# Patient Record
Sex: Female | Born: 1965
Health system: Southern US, Community
[De-identification: ages and names within clinical notes are randomized; demographics above are authoritative.]

## PROBLEM LIST (undated history)

## (undated) DIAGNOSIS — F319 Bipolar disorder, unspecified: Secondary | ICD-10-CM

## (undated) DIAGNOSIS — I10 Essential (primary) hypertension: Secondary | ICD-10-CM

## (undated) DIAGNOSIS — F329 Major depressive disorder, single episode, unspecified: Secondary | ICD-10-CM

## (undated) DIAGNOSIS — F32A Depression, unspecified: Secondary | ICD-10-CM

## (undated) HISTORY — PX: JOINT REPLACEMENT: SHX530

## (undated) HISTORY — PX: ABDOMINAL HYSTERECTOMY: SHX81

## (undated) HISTORY — DX: Bipolar disorder, unspecified: F31.9

## (undated) HISTORY — PX: TOTAL ABDOMINAL HYSTERECTOMY: SHX209

## (undated) HISTORY — PX: APPENDECTOMY: SHX54

## (undated) HISTORY — DX: Essential (primary) hypertension: I10

---

## 2005-01-16 ENCOUNTER — Ambulatory Visit: Payer: Self-pay

## 2005-01-25 ENCOUNTER — Ambulatory Visit: Payer: Self-pay

## 2005-03-19 ENCOUNTER — Inpatient Hospital Stay: Payer: Self-pay | Admitting: Psychiatry

## 2006-08-07 ENCOUNTER — Ambulatory Visit: Payer: Self-pay

## 2007-07-08 ENCOUNTER — Other Ambulatory Visit: Payer: Self-pay

## 2007-07-08 ENCOUNTER — Emergency Department: Payer: Self-pay | Admitting: Emergency Medicine

## 2007-07-10 ENCOUNTER — Ambulatory Visit: Payer: Self-pay | Admitting: Family Medicine

## 2007-08-29 ENCOUNTER — Ambulatory Visit: Payer: Self-pay | Admitting: Family Medicine

## 2007-09-30 ENCOUNTER — Ambulatory Visit: Payer: Self-pay | Admitting: Neurology

## 2007-10-08 ENCOUNTER — Ambulatory Visit: Payer: Self-pay

## 2008-05-17 ENCOUNTER — Ambulatory Visit: Payer: Self-pay | Admitting: Neurology

## 2008-09-17 ENCOUNTER — Ambulatory Visit: Payer: Self-pay | Admitting: Family Medicine

## 2009-11-03 ENCOUNTER — Ambulatory Visit: Payer: Self-pay | Admitting: Nephrology

## 2011-03-22 ENCOUNTER — Ambulatory Visit: Payer: Self-pay | Admitting: Nephrology

## 2011-06-06 ENCOUNTER — Emergency Department: Payer: Self-pay | Admitting: Unknown Physician Specialty

## 2011-06-06 LAB — COMPREHENSIVE METABOLIC PANEL
Bilirubin,Total: 0.5 mg/dL (ref 0.2–1.0)
Calcium, Total: 9.1 mg/dL (ref 8.5–10.1)
Chloride: 106 mmol/L (ref 98–107)
Co2: 26 mmol/L (ref 21–32)
Creatinine: 0.63 mg/dL (ref 0.60–1.30)
EGFR (African American): >60
EGFR (Non-African Amer.): >60
Osmolality: 279 (ref 275–301)
Sodium: 140 mmol/L (ref 136–145)

## 2011-06-06 LAB — URINALYSIS, COMPLETE
Bilirubin,UR: NEGATIVE
Blood: NEGATIVE
Ketone: NEGATIVE
Ph: 8 (ref 4.5–8.0)
Squamous Epithelial: 2

## 2011-06-06 LAB — CBC
HGB: 12 g/dL (ref 12.0–16.0)
Platelet: 221 10*3/uL (ref 150–440)
RBC: 4.17 10*6/uL (ref 3.80–5.20)

## 2011-06-26 ENCOUNTER — Ambulatory Visit: Payer: Self-pay | Admitting: Obstetrics and Gynecology

## 2011-07-10 ENCOUNTER — Ambulatory Visit: Payer: Self-pay | Admitting: Gynecologic Oncology

## 2011-07-25 ENCOUNTER — Ambulatory Visit: Payer: Self-pay | Admitting: Gynecologic Oncology

## 2011-08-08 ENCOUNTER — Ambulatory Visit: Payer: Self-pay | Admitting: Obstetrics and Gynecology

## 2011-08-08 LAB — CBC
HCT: 35.4 % (ref 35.0–47.0)
HGB: 11.8 g/dL — ABNORMAL LOW (ref 12.0–16.0)
MCH: 29.1 pg (ref 26.0–34.0)
RDW: 12.9 % (ref 11.5–14.5)
WBC: 4.5 10*3/uL (ref 3.6–11.0)

## 2011-08-14 ENCOUNTER — Ambulatory Visit: Payer: Self-pay | Admitting: Obstetrics and Gynecology

## 2011-08-16 LAB — PATHOLOGY REPORT

## 2012-05-14 ENCOUNTER — Ambulatory Visit: Payer: Self-pay

## 2012-05-19 LAB — HM MAMMOGRAPHY

## 2014-07-18 NOTE — Op Note (Signed)
PATIENT NAME:  Gabriella Hensley, Gabriella Hensley MR#:  [id number] DATE OF BIRTH:  03-22-1966  DATE OF PROCEDURE:  08/14/2011  PREOPERATIVE DIAGNOSIS: Left ovarian cyst.   POSTOPERATIVE DIAGNOSES:  1. Left paratubal cyst.  2. Significant peritoneal adhesive disease.   PROCEDURES PERFORMED: Diagnostic laparoscopy with lysis of adhesions as well as bilateral salpingectomy.   SURGEON: Stoney Bang. Georgianne Fick, MD  ASSISTANT: Dr. Foster Simpson   ANESTHESIA: General.   ESTIMATED BLOOD LOSS: 75 mL.  OPERATIVE FLUIDS: 800 mL.   URINE OUTPUT: 70 mL.   COMPLICATIONS: None.   FINDINGS: Upon entry into the abdomen and establishing insufflation it was noted there is significant adhesive disease of the rectus sigmoid colon to the vaginal cuff as well as to the left sidewall. These adhesions were taken down for the most part although there was still a thick band of adhesion from the left sidewall to the vaginal cuff which was not taken down secondary to concern for possibly containing a portion of the ureter. The right ovary and tube appeared grossly normal in appearance. The left tube was noted to have a large 2 to 3 cm paratubal cyst. No left ovarian tissue was able to be identified. The remainder of the pelvic anatomy as well as intra-abdominal anatomy was grossly normal in appearance.   SPECIMENS REMOVED: Portion of right and left tube.   PATIENT CONDITION FOLLOWING PROCEDURE: Stable.   PROCEDURE IN DETAIL: Risks, benefits, and alternatives of procedure were discussed with the patient prior to proceeding to the Operating Room. Of note, the patient does have a significant family history of ovarian cancer in her mother. She did undergo BRCA testing prior to proceeding to the Operating Room as well as consultation with Dr. Jacquelyne Balint of gynecology oncology. At the beginning of the case a Foley catheter was placed. A sponge stick was placed in the patient's vagina to allow delineation of the vaginal cuff. Next,  attention was turned to the patient's abdomen. The umbilicus was infiltrated with approximately 5 mL of 1% lidocaine. A stab incision was then made in the base of the umbilicus and a 5 mm Xcel trocar was placed under direct visualization into the peritoneal cavity. Insufflation was begun. A 10 mm right assistant port and a 5 mm left assistant port were then placed under direct visualization. Inspection of the abdomen noted the above noted findings. Initial concern had been for a left complex ovarian cyst, however, it appears that the radiological findings were secondary to left peritubal cyst and significant adhesive disease on the left with no identifiable left ovary noted. Attention was first turned to the patient's right adnexal structures. The ovary appeared normal. The tube was grasped and then dissected off the ovary and the pelvic sidewall using a 5 mm LigaSure device. The right tube was then removed through the 10 mm trocar. Attention was then turned to the patient's left where significant adhesions were noted. The adhesions were taken down with the 5 mm LigaSure device as well as blunt dissection. The vaginal cuff had a significant amount of rectosigmoid adhesed to it and this was also taken down for the most part using a 5 mm LigaSure device. There was a thick band of adhesion from the left sidewall to the vaginal cuff which was not taken down secondary to concern of potentially containing the ureter. At this point, enough visualization had been established to see the left tube which contained a large peritubal cyst. The left tube was excised using the 5 mm LigaSure  device. The ureter was seen coursing medially. The left retroperitoneum was opened up in order to allow visualization of the IP which was followed, however, no ovary was able to be identified. At this point it was decided that radiological findings likely corresponded to the paratubal cyst as well as adhesive disease rather than a left ovarian  cyst. It was decided to stop the procedure at this point as the pathology had been identified and removed. Following removal of both tubes the pelvis was irrigated and hemostasis was achieved using bipolar cautery. The trocars were then removed after pneumoperitoneum was evacuated. The 10 mm trocar site on the right was closed using a 2-0 Vicryl and a GU needle. Following closure of the right trocar site the remaining trocar sites as well as the right trocar site were dressed with Dermabond. After achieving hemostasis using bipolar cautery 1 gram of Aristo was placed on the bed of the left fallopian tube dissection to further guarantee hemostasis. Sponge, needle, and instrument counts were correct x2.     The patient tolerated the procedure well and was taken to recovery room in stable condition.  ____________________________ Stoney Bang. Georgianne Fick, MD ams:cms D: 08/14/2011 12:56:02 ET T: 08/14/2011 13:59:59 ET JOB#: [id number]  cc: Stoney Bang. Georgianne Fick, MD, <Dictator> Conan Bowens Madelon Lips MD ELECTRONICALLY SIGNED 08/21/2011 7:23

## 2014-09-15 ENCOUNTER — Encounter: Payer: Self-pay | Admitting: Emergency Medicine

## 2014-09-15 ENCOUNTER — Emergency Department
Admission: EM | Admit: 2014-09-15 | Discharge: 2014-09-15 | Disposition: A | Discharge status: Home / Self Care | Payer: BLUE CROSS/BLUE SHIELD | Attending: Student | Admitting: Student

## 2014-09-15 ENCOUNTER — Emergency Department: Payer: BLUE CROSS/BLUE SHIELD

## 2014-09-15 DIAGNOSIS — Y9389 Activity, other specified: Secondary | ICD-10-CM | POA: Diagnosis not present

## 2014-09-15 DIAGNOSIS — Y92410 Unspecified street and highway as the place of occurrence of the external cause: Secondary | ICD-10-CM | POA: Insufficient documentation

## 2014-09-15 DIAGNOSIS — S199XXA Unspecified injury of neck, initial encounter: Secondary | ICD-10-CM | POA: Diagnosis not present

## 2014-09-15 DIAGNOSIS — Z79899 Other long term (current) drug therapy: Secondary | ICD-10-CM | POA: Insufficient documentation

## 2014-09-15 DIAGNOSIS — Y998 Other external cause status: Secondary | ICD-10-CM | POA: Diagnosis not present

## 2014-09-15 DIAGNOSIS — S0990XA Unspecified injury of head, initial encounter: Secondary | ICD-10-CM | POA: Diagnosis not present

## 2014-09-15 HISTORY — DX: Major depressive disorder, single episode, unspecified: F32.9

## 2014-09-15 HISTORY — DX: Depression, unspecified: F32.A

## 2014-09-15 MED ORDER — CYCLOBENZAPRINE HCL 10 MG PO TABS: 10.0000 mg | ORAL_TABLET | Freq: Three times a day (TID) | ORAL | Status: DC | PRN

## 2014-09-15 MED ORDER — IBUPROFEN 800 MG PO TABS: 800.0000 mg | ORAL_TABLET | Freq: Three times a day (TID) | ORAL | Status: DC | PRN
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
## 2014-09-15 NOTE — Discharge Instructions (Signed)

## 2014-11-09 ENCOUNTER — Ambulatory Visit (INDEPENDENT_AMBULATORY_CARE_PROVIDER_SITE_OTHER): Payer: BLUE CROSS/BLUE SHIELD | Admitting: Unknown Physician Specialty

## 2014-11-09 ENCOUNTER — Encounter: Payer: Self-pay | Admitting: Unknown Physician Specialty

## 2014-11-09 VITALS — BP 132/81 | HR 56 | Temp 98.3°F | Ht 67.3 in | Wt 236.0 lb

## 2014-11-09 DIAGNOSIS — F419 Anxiety disorder, unspecified: Secondary | ICD-10-CM | POA: Insufficient documentation

## 2014-11-09 DIAGNOSIS — E785 Hyperlipidemia, unspecified: Secondary | ICD-10-CM | POA: Insufficient documentation

## 2014-11-09 DIAGNOSIS — L298 Other pruritus: Secondary | ICD-10-CM | POA: Diagnosis not present

## 2014-11-09 DIAGNOSIS — E669 Obesity, unspecified: Secondary | ICD-10-CM | POA: Insufficient documentation

## 2014-11-09 DIAGNOSIS — F319 Bipolar disorder, unspecified: Secondary | ICD-10-CM

## 2014-11-09 DIAGNOSIS — F329 Major depressive disorder, single episode, unspecified: Secondary | ICD-10-CM

## 2014-11-09 DIAGNOSIS — F32A Depression, unspecified: Secondary | ICD-10-CM | POA: Insufficient documentation

## 2014-11-09 DIAGNOSIS — F3181 Bipolar II disorder: Secondary | ICD-10-CM | POA: Insufficient documentation

## 2014-11-09 DIAGNOSIS — N898 Other specified noninflammatory disorders of vagina: Secondary | ICD-10-CM

## 2014-11-09 LAB — UA/M W/RFLX CULTURE, ROUTINE
Bilirubin, UA: NEGATIVE
Glucose, UA: NEGATIVE
Ketones, UA: NEGATIVE
LEUKOCYTES UA: NEGATIVE
Nitrite, UA: NEGATIVE
PH UA: 6 (ref 5.0–7.5)
Protein, UA: NEGATIVE
Specific Gravity, UA: 1.02 (ref 1.005–1.030)
Urobilinogen, Ur: 0.2 mg/dL (ref 0.2–1.0)

## 2014-11-09 LAB — MICROSCOPIC EXAMINATION

## 2014-11-09 LAB — WET PREP FOR TRICH, YEAST, CLUE
CLUE CELL EXAM: NEGATIVE
Trichomonas Exam: NEGATIVE
Yeast Exam: NEGATIVE

## 2014-11-09 MED ORDER — FLUCONAZOLE 150 MG PO TABS: 150.0000 mg | ORAL_TABLET | Freq: Once | ORAL | Status: DC

## 2014-11-09 NOTE — Progress Notes (Signed)
   BP 132/81 mmHg  Pulse 56  Temp(Src) 98.3 F (36.8 C)  Ht 5' 7.3" (1.709 m)  Wt 236 lb (107.049 kg)  BMI 36.65 kg/m2  SpO2 98%  LMP  (LMP Unknown)   Subjective:    Patient ID: Gabriella Hensley, female    DOB: 01-15-1966, 49 y.o.   MRN: [id number]  HPI: Gabriella Hensley is a 49 y.o. female  Chief Complaint  Patient presents with  . Vaginitis    pt states she has vaginal itching and burning. States she tried Azo.   Vaginitis Pt with itching around urethra.  Tried Azo and made her back hurt.  No vaginal discharge.  Not concerned about STDs.  No addominal or pelvic pain.  No pain with intercourse.  States she feels it is irritated back to rectum.    Relevant past medical, surgical, family and social history reviewed and updated as indicated. Interim medical history since our last visit reviewed. Allergies and medications reviewed and updated.  Review of Systems  Per HPI unless specifically indicated above     Objective:    BP 132/81 mmHg  Pulse 56  Temp(Src) 98.3 F (36.8 C)  Ht 5' 7.3" (1.709 m)  Wt 236 lb (107.049 kg)  BMI 36.65 kg/m2  SpO2 98%  LMP  (LMP Unknown)  Wt Readings from Last 3 Encounters:  11/09/14 236 lb (107.049 kg)  05/28/14 233 lb (105.688 kg)  09/15/14 234 lb (106.142 kg)    Physical Exam  Constitutional: She is oriented to person, place, and time. She appears well-developed and well-nourished. No distress.  HENT:  Head: Normocephalic and atraumatic.  Eyes: Conjunctivae and lids are normal. Right eye exhibits no discharge. Left eye exhibits no discharge. No scleral icterus.  Cardiovascular: Normal rate and regular rhythm.   Pulmonary/Chest: Effort normal. No respiratory distress.  Abdominal: Soft. Normal appearance. She exhibits no distension. There is no splenomegaly or hepatomegaly. There is no tenderness.  Genitourinary: Pelvic exam was performed with patient prone. Cervix exhibits no motion tenderness, no discharge and no friability. There is  erythema in the vagina.  Musculoskeletal: Normal range of motion.  Neurological: She is alert and oriented to person, place, and time.  Skin: Skin is intact. No rash noted. No pallor.  Psychiatric: She has a normal mood and affect. Her behavior is normal. Judgment and thought content normal.  Nursing note and vitals reviewed.  Wet prep is negative     Assessment & Plan:   Problem List Items Addressed This Visit    None    Visit Diagnoses    Vaginal itching    -  Primary    Uncertain diagnosis.  since complaining of irritation, I will rx Diflucan 150 mg.      Relevant Orders    UA/M w/rflx Culture, Routine    WET PREP FOR TRICH, YEAST, CLUE        Follow up plan: Return for physical.

## 2014-12-01 ENCOUNTER — Ambulatory Visit (INDEPENDENT_AMBULATORY_CARE_PROVIDER_SITE_OTHER): Payer: BLUE CROSS/BLUE SHIELD | Admitting: Unknown Physician Specialty

## 2014-12-01 ENCOUNTER — Encounter: Payer: Self-pay | Admitting: Unknown Physician Specialty

## 2014-12-01 VITALS — BP 127/85 | HR 63 | Temp 98.3°F | Ht 67.3 in | Wt 237.0 lb

## 2014-12-01 DIAGNOSIS — Z0000 Encounter for general adult medical examination without abnormal findings: Secondary | ICD-10-CM

## 2014-12-01 NOTE — Progress Notes (Signed)
   BP 127/85 mmHg  Pulse 63  Temp(Src) 98.3 F (36.8 C)  Ht 5' 7.3" (1.709 m)  Wt 237 lb (107.502 kg)  BMI 36.81 kg/m2  SpO2 99%  LMP  (LMP Unknown)   Subjective:    Patient ID: Gabriella Hensley, female    DOB: [birth year], 49 y.o.   MRN: [id number]  HPI: Gabriella Hensley is a 49 y.o. female  Chief Complaint  Patient presents with  . Annual Exam    Relevant past medical, surgical, family and social history reviewed and updated as indicated. Interim medical history since our last visit reviewed. Allergies and medications reviewed and updated.  Review of Systems  Constitutional: Negative.   HENT: Negative.   Eyes: Negative.   Respiratory: Negative.   Cardiovascular: Negative.   Gastrointestinal: Negative.   Endocrine: Negative.   Genitourinary: Negative.   Musculoskeletal: Negative.   Skin: Negative.   Allergic/Immunologic: Negative.   Neurological: Negative.   Hematological: Negative.   Psychiatric/Behavioral: Negative.     Per HPI unless specifically indicated above     Objective:    BP 127/85 mmHg  Pulse 63  Temp(Src) 98.3 F (36.8 C)  Ht 5' 7.3" (1.709 m)  Wt 237 lb (107.502 kg)  BMI 36.81 kg/m2  SpO2 99%  LMP  (LMP Unknown)  Wt Readings from Last 3 Encounters:  12/01/14 237 lb (107.502 kg)  11/09/14 236 lb (107.049 kg)  05/28/14 233 lb (105.688 kg)    Physical Exam  Constitutional: She is oriented to person, place, and time. She appears well-developed and well-nourished.  HENT:  Head: Normocephalic and atraumatic.  Eyes: Pupils are equal, round, and reactive to light. Right eye exhibits no discharge. Left eye exhibits no discharge. No scleral icterus.  Neck: Normal range of motion. Neck supple. Carotid bruit is not present. No thyromegaly present.  Cardiovascular: Normal rate, regular rhythm and normal heart sounds.  Exam reveals no gallop and no friction rub.   No murmur heard. Pulmonary/Chest: Effort normal and breath sounds normal. No respiratory  distress. She has no wheezes. She has no rales.  Abdominal: Soft. Bowel sounds are normal. There is no tenderness. There is no rebound.  Genitourinary: No breast swelling, tenderness or discharge.  Musculoskeletal: Normal range of motion.  Lymphadenopathy:    She has no cervical adenopathy.  Neurological: She is alert and oriented to person, place, and time.  Skin: Skin is warm, dry and intact. No rash noted.  Psychiatric: She has a normal mood and affect. Her speech is normal and behavior is normal. Judgment and thought content normal. Cognition and memory are normal.       Assessment & Plan:   Problem List Items Addressed This Visit    None    Visit Diagnoses    Annual physical exam    -  Primary    Relevant Orders    HIV antibody    CBC with Differential/Platelet    Comprehensive metabolic panel    TSH    Lipid Panel w/o Chol/HDL Ratio    MM DIGITAL SCREENING BILATERAL        Follow up plan: Return in about 6 months (around 05/31/2015).

## 2014-12-02 ENCOUNTER — Encounter: Payer: Self-pay | Admitting: Unknown Physician Specialty

## 2014-12-02 LAB — COMPREHENSIVE METABOLIC PANEL
A/G RATIO: 1.6 (ref 1.1–2.5)
ALBUMIN: 4.3 g/dL (ref 3.5–5.5)
ALT: 16 IU/L (ref 0–32)
AST: 18 IU/L (ref 0–40)
Alkaline Phosphatase: 102 IU/L (ref 39–117)
BILIRUBIN TOTAL: 0.4 mg/dL (ref 0.0–1.2)
BUN / CREAT RATIO: 15 (ref 9–23)
BUN: 11 mg/dL (ref 6–24)
CO2: 24 mmol/L (ref 18–29)
Calcium: 9.8 mg/dL (ref 8.7–10.2)
Chloride: 101 mmol/L (ref 97–108)
Creatinine, Ser: 0.71 mg/dL (ref 0.57–1.00)
GFR calc non Af Amer: 101 mL/min/{1.73_m2} (ref >59)
GFR, EST AFRICAN AMERICAN: 116 mL/min/{1.73_m2} (ref >59)
Globulin, Total: 2.7 g/dL (ref 1.5–4.5)
Glucose: 88 mg/dL (ref 65–99)
Potassium: 4.2 mmol/L (ref 3.5–5.2)
Sodium: 139 mmol/L (ref 134–144)
TOTAL PROTEIN: 7 g/dL (ref 6.0–8.5)

## 2014-12-02 LAB — CBC WITH DIFFERENTIAL/PLATELET
BASOS: 0 %
Basophils Absolute: 0 10*3/uL (ref 0.0–0.2)
EOS (ABSOLUTE): 0.1 10*3/uL (ref 0.0–0.4)
Eos: 2 %
HEMATOCRIT: 35 % (ref 34.0–46.6)
Hemoglobin: 11.9 g/dL (ref 11.1–15.9)
IMMATURE GRANS (ABS): 0 10*3/uL (ref 0.0–0.1)
Immature Granulocytes: 0 %
LYMPHS: 44 %
Lymphocytes Absolute: 1.9 10*3/uL (ref 0.7–3.1)
MCH: 28.6 pg (ref 26.6–33.0)
MCHC: 34 g/dL (ref 31.5–35.7)
MCV: 84 fL (ref 79–97)
Monocytes Absolute: 0.3 10*3/uL (ref 0.1–0.9)
Monocytes: 8 %
Neutrophils Absolute: 1.9 10*3/uL (ref 1.4–7.0)
Neutrophils: 46 %
Platelets: 244 10*3/uL (ref 150–379)
RBC: 4.16 x10E6/uL (ref 3.77–5.28)
RDW: 13.5 % (ref 12.3–15.4)
WBC: 4.3 10*3/uL (ref 3.4–10.8)

## 2014-12-02 LAB — LIPID PANEL W/O CHOL/HDL RATIO
Cholesterol, Total: 194 mg/dL (ref 100–199)
HDL: 49 mg/dL (ref >39)
LDL Calculated: 131 mg/dL — ABNORMAL HIGH (ref 0–99)
Triglycerides: 70 mg/dL (ref 0–149)
VLDL Cholesterol Cal: 14 mg/dL (ref 5–40)

## 2014-12-02 LAB — TSH: TSH: 1.65 u[IU]/mL (ref 0.450–4.500)

## 2014-12-02 LAB — HIV ANTIBODY (ROUTINE TESTING W REFLEX): HIV Screen 4th Generation wRfx: NONREACTIVE

## 2015-02-25 ENCOUNTER — Other Ambulatory Visit: Payer: Self-pay | Admitting: Unknown Physician Specialty

## 2015-03-25 ENCOUNTER — Other Ambulatory Visit: Payer: Self-pay | Admitting: Unknown Physician Specialty

## 2015-03-25 MED ORDER — RISPERIDONE 3 MG PO TABS: 3.0000 mg | ORAL_TABLET | Freq: Every day | ORAL | Status: DC

## 2015-03-25 NOTE — Telephone Encounter (Signed)
Patient was seen 12/01/14 and has appointment 05/31/15.

## 2015-03-25 NOTE — Telephone Encounter (Signed)
Pt called needs refill on Risperidone. Pharm is Paediatric nurse on [address] Pt stated she needs this medication by Monday if possible. Thanks.

## 2015-05-31 ENCOUNTER — Ambulatory Visit (INDEPENDENT_AMBULATORY_CARE_PROVIDER_SITE_OTHER): Payer: BLUE CROSS/BLUE SHIELD | Admitting: Unknown Physician Specialty

## 2015-05-31 ENCOUNTER — Encounter: Payer: Self-pay | Admitting: Unknown Physician Specialty

## 2015-05-31 VITALS — BP 129/84 | HR 60 | Temp 98.0°F | Ht 66.2 in | Wt 232.6 lb

## 2015-05-31 DIAGNOSIS — F3181 Bipolar II disorder: Secondary | ICD-10-CM

## 2015-05-31 NOTE — Assessment & Plan Note (Signed)
Stable, continue present medications.   

## 2015-05-31 NOTE — Progress Notes (Signed)
   BP 129/84 mmHg  Pulse 60  Temp(Src) 98 F (36.7 C)  Ht 5' 6.2" (1.681 m)  Wt 232 lb 9.6 oz (105.507 kg)  BMI 37.34 kg/m2  SpO2 99%  LMP  (LMP Unknown)   Subjective:    Patient ID: Gabriella Hensley, female    DOB: Jun 24, 1965, 50 y.o.   MRN: [id number]  HPI: Gabriella Hensley is a 50 y.o. female  Chief Complaint  Patient presents with  . Depression   Depression/bipolar Pt has been on the same medications since 2004 other than 2007 switching anti-depressant to Buproprion when she went to the hospital for a relapse.  She maintains a full time job at [employer].  She would like refills today.  No metabolic side-effects as she lost 5 pounds from last visit.    Depression screen [facility] 2/9 05/31/2015 12/01/2014  Decreased Interest 0 0  Down, Depressed, Hopeless 0 0  PHQ - 2 Score 0 0     Relevant past medical, surgical, family and social history reviewed and updated as indicated. Interim medical history since our last visit reviewed. Allergies and medications reviewed and updated.  Review of Systems  Per HPI unless specifically indicated above     Objective:    BP 129/84 mmHg  Pulse 60  Temp(Src) 98 F (36.7 C)  Ht 5' 6.2" (1.681 m)  Wt 232 lb 9.6 oz (105.507 kg)  BMI 37.34 kg/m2  SpO2 99%  LMP  (LMP Unknown)  Wt Readings from Last 3 Encounters:  05/31/15 232 lb 9.6 oz (105.507 kg)  12/01/14 237 lb (107.502 kg)  11/09/14 236 lb (107.049 kg)    Physical Exam  Constitutional: She is oriented to person, place, and time. She appears well-developed and well-nourished. No distress.  HENT:  Head: Normocephalic and atraumatic.  Eyes: Conjunctivae and lids are normal. Right eye exhibits no discharge. Left eye exhibits no discharge. No scleral icterus.  Neck: Normal range of motion. Neck supple. No JVD present. Carotid bruit is not present.  Cardiovascular: Normal rate, regular rhythm and normal heart sounds.   Pulmonary/Chest: Effort normal and breath sounds normal.  Abdominal: Normal  appearance. There is no splenomegaly or hepatomegaly.  Musculoskeletal: Normal range of motion.  Neurological: She is alert and oriented to person, place, and time.  Skin: Skin is warm, dry and intact. No rash noted. No pallor.  Psychiatric: She has a normal mood and affect. Her behavior is normal. Judgment and thought content normal.     Assessment & Plan:   Problem List Items Addressed This Visit      Unprioritized   Bipolar 2 disorder ([place]) - Primary    Stable, continue present medications.            Follow up plan: Return in about 6 months (around 12/01/2015) for physical.

## 2015-07-26 ENCOUNTER — Other Ambulatory Visit: Payer: Self-pay | Admitting: Unknown Physician Specialty

## 2015-07-26 ENCOUNTER — Ambulatory Visit
Admission: RE | Admit: 2015-07-26 | Discharge: 2015-07-26 | Disposition: A | Discharge status: Home / Self Care | Payer: BLUE CROSS/BLUE SHIELD | Source: Ambulatory Visit | Attending: Unknown Physician Specialty | Admitting: Unknown Physician Specialty

## 2015-07-26 DIAGNOSIS — Z1231 Encounter for screening mammogram for malignant neoplasm of breast: Secondary | ICD-10-CM | POA: Diagnosis not present

## 2015-07-26 DIAGNOSIS — Z0000 Encounter for general adult medical examination without abnormal findings: Secondary | ICD-10-CM

## 2015-09-30 DIAGNOSIS — J301 Allergic rhinitis due to pollen: Secondary | ICD-10-CM | POA: Diagnosis not present

## 2015-10-28 ENCOUNTER — Other Ambulatory Visit: Payer: Self-pay | Admitting: Unknown Physician Specialty

## 2015-11-17 ENCOUNTER — Encounter (INDEPENDENT_AMBULATORY_CARE_PROVIDER_SITE_OTHER): Payer: Self-pay

## 2015-11-26 ENCOUNTER — Other Ambulatory Visit: Payer: Self-pay | Admitting: Family Medicine

## 2015-12-09 ENCOUNTER — Ambulatory Visit (INDEPENDENT_AMBULATORY_CARE_PROVIDER_SITE_OTHER): Payer: BLUE CROSS/BLUE SHIELD | Admitting: Unknown Physician Specialty

## 2015-12-09 ENCOUNTER — Other Ambulatory Visit: Payer: Self-pay

## 2015-12-09 ENCOUNTER — Encounter: Payer: Self-pay | Admitting: Unknown Physician Specialty

## 2015-12-09 VITALS — BP 116/80 | HR 63 | Temp 98.2°F | Ht 66.6 in | Wt 231.4 lb

## 2015-12-09 DIAGNOSIS — E669 Obesity, unspecified: Secondary | ICD-10-CM | POA: Diagnosis not present

## 2015-12-09 DIAGNOSIS — F32A Depression, unspecified: Secondary | ICD-10-CM

## 2015-12-09 DIAGNOSIS — F3181 Bipolar II disorder: Secondary | ICD-10-CM

## 2015-12-09 DIAGNOSIS — Z0000 Encounter for general adult medical examination without abnormal findings: Secondary | ICD-10-CM | POA: Diagnosis not present

## 2015-12-09 DIAGNOSIS — F329 Major depressive disorder, single episode, unspecified: Secondary | ICD-10-CM

## 2015-12-09 DIAGNOSIS — M7071 Other bursitis of hip, right hip: Secondary | ICD-10-CM

## 2015-12-09 DIAGNOSIS — Z5181 Encounter for therapeutic drug level monitoring: Secondary | ICD-10-CM

## 2015-12-09 LAB — BAYER DCA HB A1C WAIVED: HB A1C (BAYER DCA - WAIVED): 5.9 % (ref <7.0)

## 2015-12-09 LAB — HEMOGLOBIN A1C

## 2015-12-09 MED ORDER — BUPROPION HCL ER (SMOKING DET) 150 MG PO TB12: 150.0000 mg | ORAL_TABLET | Freq: Every day | ORAL | 1 refills | Status: DC

## 2015-12-09 MED ORDER — RISPERIDONE 3 MG PO TABS: 3.0000 mg | ORAL_TABLET | Freq: Every day | ORAL | 3 refills | Status: DC

## 2015-12-09 NOTE — Assessment & Plan Note (Signed)
Stable, continue present medications.   

## 2015-12-09 NOTE — Assessment & Plan Note (Signed)
Discussed diet and exercise 

## 2015-12-09 NOTE — Progress Notes (Signed)
BP 116/80 (BP Location: Left Arm, Patient Position: Sitting, Cuff Size: Large)   Pulse 63   Temp 98.2 F (36.8 C)   Ht 5' 6.6" (1.692 m)   Wt 231 lb 6.4 oz (105 kg)   LMP  (LMP Unknown)   SpO2 98%   BMI 36.68 kg/m    Subjective:    Patient ID: Gabriella Hensley, female    DOB: [birth year], 50 y.o.   MRN: [id number]  HPI: Gabriella Hensley is a 50 y.o. female  Chief Complaint  Patient presents with  . Annual Exam   Presents today for annual physical, also wants to discuss lower back pain and numbness in her right leg.  Depression Stable.   Depression screen [facility] 2/9 05/31/2015 12/01/2014  Decreased Interest 0 0  Down, Depressed, Hopeless 0 0  PHQ - 2 Score 0 0        Relevant past medical, surgical, family and social history reviewed and updated as indicated. Interim medical history since our last visit reviewed. Allergies and medications reviewed and updated.  Review of Systems  Constitutional: Negative.   HENT: Negative.   Eyes: Negative.   Respiratory: Negative.   Cardiovascular: Negative.   Gastrointestinal: Negative.   Endocrine: Negative.   Genitourinary: Negative.   Musculoskeletal: Negative.  Negative for back pain.  Skin: Negative.   Allergic/Immunologic: Negative.   Neurological: Positive for numbness.  Hematological: Negative.   Psychiatric/Behavioral: Negative.     Per HPI unless specifically indicated above     Objective:    BP 116/80 (BP Location: Left Arm, Patient Position: Sitting, Cuff Size: Large)   Pulse 63   Temp 98.2 F (36.8 C)   Ht 5' 6.6" (1.692 m)   Wt 231 lb 6.4 oz (105 kg)   LMP  (LMP Unknown)   SpO2 98%   BMI 36.68 kg/m   Wt Readings from Last 3 Encounters:  12/09/15 231 lb 6.4 oz (105 kg)  05/31/15 232 lb 9.6 oz (105.5 kg)  12/01/14 237 lb (107.5 kg)    Physical Exam  Constitutional: She is oriented to person, place, and time. Vital signs are normal. She appears well-developed and well-nourished. No distress.  HENT:  Head:  Normocephalic.  Right Ear: External ear normal.  Left Ear: External ear normal.  Mouth/Throat: Oropharynx is clear and moist.  Eyes: Conjunctivae are normal. Pupils are equal, round, and reactive to light.  Neck: Carotid bruit is not present.  Cardiovascular: Normal rate, regular rhythm and normal heart sounds.   Pulmonary/Chest: Effort normal and breath sounds normal.  Abdominal: Soft. Bowel sounds are normal.  Neurological: She is alert and oriented to person, place, and time. She has normal strength.  Reflex Scores:      Bicep reflexes are 2+ on the right side and 2+ on the left side.      Patellar reflexes are 2+ on the right side and 2+ on the left side. Skin: Skin is warm and dry.  Psychiatric: She has a normal mood and affect. Her behavior is normal. Judgment and thought content normal.    Results for orders placed or performed in visit on 12/01/14  HIV antibody  Result Value Ref Range   HIV Screen 4th Generation wRfx Non Reactive Non Reactive  CBC with Differential/Platelet  Result Value Ref Range   WBC 4.3 3.4 - 10.8 x10E3/uL   RBC 4.16 3.77 - 5.28 x10E6/uL   Hemoglobin 11.9 11.1 - 15.9 g/dL   Hematocrit 35.0 34.0 - 46.6 %  MCV 84 79 - 97 fL   MCH 28.6 26.6 - 33.0 pg   MCHC 34.0 31.5 - 35.7 g/dL   RDW 13.5 12.3 - 15.4 %   Platelets 244 150 - 379 x10E3/uL   Neutrophils 46 %   Lymphs 44 %   Monocytes 8 %   Eos 2 %   Basos 0 %   Neutrophils Absolute 1.9 1.4 - 7.0 x10E3/uL   Lymphocytes Absolute 1.9 0.7 - 3.1 x10E3/uL   Monocytes Absolute 0.3 0.1 - 0.9 x10E3/uL   EOS (ABSOLUTE) 0.1 0.0 - 0.4 x10E3/uL   Basophils Absolute 0.0 0.0 - 0.2 x10E3/uL   Immature Granulocytes 0 %   Immature Grans (Abs) 0.0 0.0 - 0.1 x10E3/uL  Comprehensive metabolic panel  Result Value Ref Range   Glucose 88 65 - 99 mg/dL   BUN 11 6 - 24 mg/dL   Creatinine, Ser 0.71 0.57 - 1.00 mg/dL   GFR calc non Af Amer 101 >59 mL/min/1.73   GFR calc Af Amer 116 >59 mL/min/1.73   BUN/Creatinine  Ratio 15 9 - 23   Sodium 139 134 - 144 mmol/L   Potassium 4.2 3.5 - 5.2 mmol/L   Chloride 101 97 - 108 mmol/L   CO2 24 18 - 29 mmol/L   Calcium 9.8 8.7 - 10.2 mg/dL   Total Protein 7.0 6.0 - 8.5 g/dL   Albumin 4.3 3.5 - 5.5 g/dL   Globulin, Total 2.7 1.5 - 4.5 g/dL   Albumin/Globulin Ratio 1.6 1.1 - 2.5   Bilirubin Total 0.4 0.0 - 1.2 mg/dL   Alkaline Phosphatase 102 39 - 117 IU/L   AST 18 0 - 40 IU/L   ALT 16 0 - 32 IU/L  TSH  Result Value Ref Range   TSH 1.650 0.450 - 4.500 uIU/mL  Lipid Panel w/o Chol/HDL Ratio  Result Value Ref Range   Cholesterol, Total 194 100 - 199 mg/dL   Triglycerides 70 0 - 149 mg/dL   HDL 49 >39 mg/dL   VLDL Cholesterol Cal 14 5 - 40 mg/dL   LDL Calculated 131 (H) 0 - 99 mg/dL      Assessment & Plan:   Problem List Items Addressed This Visit      Unprioritized   Bipolar 2 disorder (HCC)    Stable, continue present medications.        Depression    Stable, continue present medications.        Obesity    Discussed diet and exercise       Other Visit Diagnoses    Annual physical exam    -  Primary   Relevant Orders   CBC with Differential/Platelet   Comprehensive metabolic panel   TSH   Lipid Panel w/o Chol/HDL Ratio   Medication monitoring encounter       Relevant Orders   Comprehensive metabolic panel   Bayer DCA Hb A1c Waived   Hip bursitis, right       Relevant Orders   Ambulatory referral to Physical Therapy       Follow up plan: Return in about 6 months (around 06/07/2016).

## 2015-12-10 LAB — LIPID PANEL W/O CHOL/HDL RATIO
Cholesterol, Total: 218 mg/dL — ABNORMAL HIGH (ref 100–199)
HDL: 51 mg/dL (ref >39)
LDL Calculated: 153 mg/dL — ABNORMAL HIGH (ref 0–99)
TRIGLYCERIDES: 69 mg/dL (ref 0–149)
VLDL Cholesterol Cal: 14 mg/dL (ref 5–40)

## 2015-12-10 LAB — COMPREHENSIVE METABOLIC PANEL
ALK PHOS: 107 IU/L (ref 39–117)
ALT: 14 IU/L (ref 0–32)
AST: 15 IU/L (ref 0–40)
Albumin/Globulin Ratio: 1.6 (ref 1.2–2.2)
Albumin: 4.5 g/dL (ref 3.5–5.5)
BILIRUBIN TOTAL: 0.7 mg/dL (ref 0.0–1.2)
BUN/Creatinine Ratio: 13 (ref 9–23)
BUN: 10 mg/dL (ref 6–24)
CHLORIDE: 99 mmol/L (ref 96–106)
CO2: 25 mmol/L (ref 18–29)
Calcium: 9.6 mg/dL (ref 8.7–10.2)
Creatinine, Ser: 0.76 mg/dL (ref 0.57–1.00)
GFR calc Af Amer: 107 mL/min/{1.73_m2} (ref >59)
GFR calc non Af Amer: 92 mL/min/{1.73_m2} (ref >59)
GLUCOSE: 82 mg/dL (ref 65–99)
Globulin, Total: 2.9 g/dL (ref 1.5–4.5)
Potassium: 4.2 mmol/L (ref 3.5–5.2)
Sodium: 139 mmol/L (ref 134–144)
Total Protein: 7.4 g/dL (ref 6.0–8.5)

## 2015-12-10 LAB — CBC WITH DIFFERENTIAL/PLATELET
BASOS ABS: 0 10*3/uL (ref 0.0–0.2)
Basos: 0 %
EOS (ABSOLUTE): 0.1 10*3/uL (ref 0.0–0.4)
Eos: 2 %
Hematocrit: 35.1 % (ref 34.0–46.6)
Hemoglobin: 11.5 g/dL (ref 11.1–15.9)
IMMATURE GRANS (ABS): 0 10*3/uL (ref 0.0–0.1)
Immature Granulocytes: 0 %
LYMPHS ABS: 2.2 10*3/uL (ref 0.7–3.1)
LYMPHS: 47 %
MCH: 27.8 pg (ref 26.6–33.0)
MCHC: 32.8 g/dL (ref 31.5–35.7)
MCV: 85 fL (ref 79–97)
Monocytes Absolute: 0.3 10*3/uL (ref 0.1–0.9)
Monocytes: 6 %
NEUTROS ABS: 2.1 10*3/uL (ref 1.4–7.0)
Neutrophils: 45 %
PLATELETS: 236 10*3/uL (ref 150–379)
RBC: 4.13 x10E6/uL (ref 3.77–5.28)
RDW: 14 % (ref 12.3–15.4)
WBC: 4.6 10*3/uL (ref 3.4–10.8)

## 2015-12-10 LAB — TSH: TSH: 1.11 u[IU]/mL (ref 0.450–4.500)

## 2015-12-12 ENCOUNTER — Encounter: Payer: Self-pay | Admitting: Unknown Physician Specialty

## 2015-12-12 ENCOUNTER — Other Ambulatory Visit: Payer: Self-pay | Admitting: Unknown Physician Specialty

## 2015-12-12 DIAGNOSIS — Z0000 Encounter for general adult medical examination without abnormal findings: Secondary | ICD-10-CM

## 2015-12-12 NOTE — Progress Notes (Signed)
Normal labs.  Pt notified through mychart

## 2015-12-23 ENCOUNTER — Telehealth: Payer: Self-pay

## 2015-12-23 ENCOUNTER — Other Ambulatory Visit: Payer: Self-pay

## 2015-12-23 NOTE — Telephone Encounter (Signed)
Screening Colonoscopy Z12.11 [facility] [id number] [employer] Pre cert is not required

## 2015-12-23 NOTE — Telephone Encounter (Signed)
Gastroenterology Pre-Procedure Review  Request Date: 02/14/2016 Requesting Physician: Dr. Julian Hy  PATIENT REVIEW QUESTIONS: The patient responded to the following health history questions as indicated:    1. Are you having any GI issues? no 2. Do you have a personal history of Polyps? no 3. Do you have a family history of Colon Cancer or Polyps? yes (Mother, polyps benign, Maternal aunt malignant polyps) 4. Diabetes Mellitus? no 5. Joint replacements in the past 12 months?no 6. Major health problems in the past 3 months?no 7. Any artificial heart valves, MVP, or defibrillator?no    MEDICATIONS & ALLERGIES:    Patient reports the following regarding taking any anticoagulation/antiplatelet therapy:   Plavix, Coumadin, Eliquis, Xarelto, Lovenox, Pradaxa, Brilinta, or Effient? no Aspirin? no  Patient confirms/reports the following medications:  Current Outpatient Prescriptions  Medication Sig Dispense Refill  . buPROPion (ZYBAN) 150 MG 12 hr tablet Take 1 tablet (150 mg total) by mouth daily. 90 tablet 1  . risperiDONE (RISPERDAL) 3 MG tablet Take 1 tablet (3 mg total) by mouth daily. 90 tablet 3   No current facility-administered medications for this visit.     Patient confirms/reports the following allergies:  No Known Allergies  No orders of the defined types were placed in this encounter.   AUTHORIZATION INFORMATION Primary Insurance: 1D#: Group #:  Secondary Insurance: 1D#: Group #:  SCHEDULE INFORMATION: Date: 02/14/2016 Time: Location: [facility]

## 2016-01-10 ENCOUNTER — Ambulatory Visit: Payer: BLUE CROSS/BLUE SHIELD | Attending: Unknown Physician Specialty

## 2016-01-10 DIAGNOSIS — M25551 Pain in right hip: Secondary | ICD-10-CM | POA: Diagnosis not present

## 2016-01-10 DIAGNOSIS — R262 Difficulty in walking, not elsewhere classified: Secondary | ICD-10-CM | POA: Insufficient documentation

## 2016-01-10 DIAGNOSIS — M6281 Muscle weakness (generalized): Secondary | ICD-10-CM | POA: Diagnosis not present

## 2016-01-10 NOTE — Patient Instructions (Signed)
  Gave supine R hip abduction resisting red band 10x3 starting with hips shoulder width apart (hip abductors in slack) and knees supported by bolster as part of her HEP daily.    Reviewed supine and L S/L sleeping positions (based on the research article "Gluteal Tendinopathy: Integrating Pathomechanics and Clinical Features in Its Management") to help decrease R lateral hip pain.     Pt demonstrated and verbalized understanding.

## 2016-01-10 NOTE — Therapy (Signed)
[facility] [address] [place], [address] Phone: [phone]   Fax:  [phone]  Physical Therapy Evaluation  Patient Details  Name: Gabriella Hensley MRN: [id number] Date of Birth: [birth year] Referring Provider: Kathrine Haddock, NP  Encounter Date: 01/10/2016      PT End of Session - 01/10/16 1434    Visit Number 1   Number of Visits 13   Date for PT Re-Evaluation 02/23/16   PT Start Time [id number]   PT Stop Time 1529   PT Time Calculation (min) 53 min   Activity Tolerance Patient tolerated treatment well   Behavior During Therapy [facility] for tasks assessed/performed      Past Medical History:  Diagnosis Date  . Bipolar 1 disorder ([place])   . Depression   . Hypertension     Past Surgical History:  Procedure Laterality Date  . ABDOMINAL HYSTERECTOMY    . APPENDECTOMY      There were no vitals filed for this visit.       Subjective Assessment - 01/10/16 1440    Subjective R hip pain. 0/10 R hip currently (pt sitting on a chair), 8/10 at worst when lying on her R hip, 5/10 at worst if not laying on her hip but walking for about an hour.    Pertinent History R hip pain. Gradual onset. The main time her hip bothers her, she is in bed. Laying on her R side bothers her hip and causes her R LE (mainly the L4,L5, S1 dermatome) to go numb all the way down to her foot. Has had her symptoms for about a year. Unknown method of injury. Pt also states that walking for about an hour bothers her hip. Pt states that her R heel hurts as well.  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 Going out on the floor and walk 5-6 times throughout the day sometimes bothers her.    Patient Stated Goals So I can lay on my right side, be able to exercise, go back to the gym (involves the treadmill)   Currently in Pain? No/denies   Pain Score 0-No pain    Pain Location Hip   Pain Orientation Right   Pain Descriptors / Indicators Tender;Numbness   Pain Type Chronic pain   Pain Onset More than a month ago   Pain Frequency Occasional   Aggravating Factors  walking about 1 hour, sitting on a chair which presses directly at her hip, laying on her R side (15-30 minutes)   Pain Relieving Factors sitting, laying on her stomach            [facility] PT Assessment - 01/10/16 1451      Assessment   Medical Diagnosis R hip bursitis   Referring Provider Kathrine Haddock, NP   Onset Date/Surgical Date 12/09/15  Date PT referral signed. Pt had hip pain for a year   Prior Therapy No known physical therapy for current condition.      Precautions   Precaution Comments No known precautions     Restrictions   Other Position/Activity Restrictions No known restrictions     Balance Screen   Has the patient fallen in the past 6 months No   Has the patient had a decrease in activity level because of a fear of falling?  No   Is the patient reluctant to leave their  home because of a fear of falling?  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      Observation/Other Assessments   Observations Reproduction of R lateral hip pain with SLS on R LE with bilateral UE assist after about 19 seconds. Decreased femoral control with step down test R LE and L LE    Lower Extremity Functional Scale  67/80     Posture/Postural Control   Posture Comments bilateral feet pronation L > R, slight L lateral shift     Strength   Right Hip Extension 4-/5   Right Hip External Rotation  4/5   Right Hip ABduction 4-/5   Left  Hip Extension 4/5   Left Hip External Rotation 5/5   Left Hip ABduction 4-/5  with R lateral hip pain secondary to R S/L position     Palpation   Palpation comment TTP R greater trochanter and R glute med muscle with reproduction of pain. TTP distal insertion of R glute med and min muscles.      Ambulation/Gait   Gait Comments R lateral trunk lean during R LE stance phase, bilateral femoral adduction and IR during stance phase L > R, contralateral pelvic drop during stance phase of gait bilaterally.       Objectives  There-ex  Directed patint with supine R hip abduction resisting red band 10x3, starting with thighs shoulder width apart (hip abductors in slack) and knees supported by bolster   Reviewed supine and L S/L sleeping positions (based on the research article "Gluteal Tendinopathy: Integrating Pathomechanics and Clinical Features in Its Management") to help decrease R lateral hip pain. Pt demonstrated and verbalized understanding.       Improved exercise technique, movement at target joints, use of target muscles after mod verbal, visual, tactile cues.                  PT Education - 01/10/16 1938    Education provided Yes   Education Details ther-ex, sleeping position to help decrease R lateral hip pain, plan of care   Person(s) Educated Patient   Methods Explanation;Demonstration;Tactile cues;Verbal cues;Handout   Comprehension Verbalized understanding;Returned demonstration             PT Long Term Goals - 01/10/16 1707      PT LONG TERM GOAL #1   Title Patient will have a decrease in R lateral hip pain to 4/10 or less at most when lying on her R side, and 3/10 or less at most if not lying on her R side to promote ability to ambulate as well as to sleep.    Baseline 8/10 at most when lying on her R side, 5/10 at most when not lying on her R side but walking long distances or duration   Time 6   Period Weeks   Status New     PT LONG TERM GOAL #2    Title Patient will imporve R glute med and max strength by at least 1/2 MMT grade to promote ability to ambulate longer distances with  less R lateral hip pain.   Time 6   Period Weeks   Status New     PT LONG TERM GOAL #3   Title Patient will improve her LEFS score by at least 9 points as a demonstration of improved function.    Baseline 67/80   Time 6   Period Weeks   Status New     PT LONG TERM GOAL #4   Title Pt will report being able to return to the gym, working on her normal exercise routine with minimal to no R lateral hip pain to promote fitness.    Baseline Patient currently not working out at her gym (involves treadmill walking) due to R lateral hip pain.    Time 6   Period Weeks   Status New               Plan - 01/10/16 1705    Clinical Impression Statement Patient is a 50 year old female who came to physical therapy secondary to R lateral hip pain. She also demonstrates reproduction of symptoms with palpation to her R greater trochanter and distal insertion of her glute med and min muscles, altered gait pattern and posture, decreased femoral control, bilateral hip weakness R > L, and difficulty performing functional tasks such as walking long distances or long duration as well as tolerating positions such as lying on her R side. Patient will benefit from skilled physical therapy services to address the aforementioned deficits.    Rehab Potential Good   Clinical Impairments Affecting Rehab Potential chronicity of condition   PT Frequency 2x / week   PT Duration 6 weeks   PT Treatment/Interventions Therapeutic exercise;Neuromuscular re-education;Therapeutic activities;Patient/family education;Dry needling;Ultrasound;Iontophoresis 4mg /ml Dexamethasone;Electrical Stimulation;Aquatic Therapy;Manual techniques   PT Next Visit Plan glute med, max strengthening, femoral control, modalities PRN   Consulted and Agree with Plan of Care Patient      Patient will benefit  from skilled therapeutic intervention in order to improve the following deficits and impairments:  Pain, Improper body mechanics, Difficulty walking, Decreased strength  Visit Diagnosis: Pain in right hip - Plan: PT plan of care cert/re-cert  Muscle weakness (generalized) - Plan: PT plan of care cert/re-cert  Difficulty in walking, not elsewhere classified - Plan: PT plan of care cert/re-cert     Problem List Patient Active Problem List   Diagnosis Date Noted  . Hyperlipidemia 11/09/2014  . Depression 11/09/2014  . Acute anxiety 11/09/2014  . Bipolar 2 disorder ([place]) 11/09/2014  . Obesity 11/09/2014    Joneen Boers PT, DPT   01/10/2016, 7:44 PM  [facility] [address] [place], [address] Phone: [phone]   Fax:  [phone]  Name: Gabriella Hensley MRN: [id number] Date of Birth: December 17, 1965

## 2016-01-12 ENCOUNTER — Ambulatory Visit: Payer: BLUE CROSS/BLUE SHIELD

## 2016-01-12 DIAGNOSIS — M6281 Muscle weakness (generalized): Secondary | ICD-10-CM | POA: Diagnosis not present

## 2016-01-12 DIAGNOSIS — R262 Difficulty in walking, not elsewhere classified: Secondary | ICD-10-CM | POA: Diagnosis not present

## 2016-01-12 DIAGNOSIS — M25551 Pain in right hip: Secondary | ICD-10-CM

## 2016-01-12 NOTE — Therapy (Signed)
[facility] [address] [place], [address] Phone: [phone]   Fax:  [phone]  Physical Therapy Treatment  Patient Details  Name: Gabriella Hensley MRN: [id number] Date of Birth: February 28, 1966 Referring Provider: Kathrine Haddock, NP  Encounter Date: 01/12/2016      PT End of Session - 01/12/16 1436    Visit Number 2   Number of Visits 13   Date for PT Re-Evaluation 02/23/16   PT Start Time [id number]   PT Stop Time 1519   PT Time Calculation (min) 43 min   Activity Tolerance Patient tolerated treatment well   Behavior During Therapy [facility] for tasks assessed/performed      Past Medical History:  Diagnosis Date  . Bipolar 1 disorder ([place])   . Depression   . Hypertension     Past Surgical History:  Procedure Laterality Date  . ABDOMINAL HYSTERECTOMY    . APPENDECTOMY      There were no vitals filed for this visit.      Subjective Assessment - 01/12/16 1436    Subjective R hip was bothering her whole leg more yesterday. Had so much to do this week and was not able to do her exercises. Busy until Saturday afternoon. 5/10 R hip pain currently.    Pertinent History R hip pain. Gradual onset. The main time her hip bothers her, she is in bed. Laying on her R side bothers her hip and causes her R LE (mainly the L4,L5, S1 dermatome) to go numb all the way down to her foot. Has had her symptoms for about a year. Unknown method of injury. Pt also states that walking for about an hour bothers her hip. Pt states that her R heel hurts as well.  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 Going out on the floor and walk 5-6 times throughout the day sometimes bothers her.    Patient Stated Goals So I can lay on my right side, be able to exercise, go back to the gym (involves the treadmill)   Currently in Pain? Yes   Pain Score 5    Pain  Orientation Right   Pain Onset More than a month ago                                 PT Education - 01/12/16 1459    Education provided Yes   Education Details ther-ex, HEP   Person(s) Educated Patient   Methods Explanation;Demonstration;Tactile cues;Verbal cues;Handout   Comprehension Returned demonstration;Verbalized understanding       Objectives  There-ex  Directed patient with supine hip abduction isometrics 40% effort 1 min contractions followed by 1 minute rest for 5x  Slight increase in hip discomfort to 6/10. Pt education to use less % effort when performing exercise at home to help perform exercise without increasing hip pain. Pt verbalized understanding.   Pt education on bursa, and femoral mechanics  Seated hip adduction isometrics (glute med tendons in slack) 10x2 with 10 second holds  Side stepping 32 ft to the R and 32 ft to the L (hip abductors always in slack)  Forward wedding march 32 ft x 2  Reviewed positions to hold off on which increases tension on glute med and min tendons. Handout provided (from research article "Gluteal Tendinopathy: Integrating  Pathomechanics and Clinical Features in Its Management"). Pt demonstrated and verbalized understanding.   Sit <> stand 10x with emphasis on femoral control. Non-painful patellar crepitus.    Improved exercise technique, movement at target joints, use of target muscles after mod verbal, visual, tactile cues.     Slight increase in R lateral hip discomfort with hip abduction isometric exercise. Pt education to use less % effort when performing exercise at home to help perform exercise without increasing hip pain. Improved femoral control with sit <> stand after cues. Cues needed to maintain positions during exercises to decrease tension to R hip abductor muscles during session.           PT Long Term Goals - 01/10/16 1707      PT LONG TERM GOAL #1   Title Patient will have a  decrease in R lateral hip pain to 4/10 or less at most when lying on her R side, and 3/10 or less at most if not lying on her R side to promote ability to ambulate as well as to sleep.    Baseline 8/10 at most when lying on her R side, 5/10 at most when not lying on her R side but walking long distances or duration   Time 6   Period Weeks   Status New     PT LONG TERM GOAL #2   Title Patient will imporve R glute med and max strength by at least 1/2 MMT grade to promote ability to ambulate longer distances with less R lateral hip pain.   Time 6   Period Weeks   Status New     PT LONG TERM GOAL #3   Title Patient will improve her LEFS score by at least 9 points as a demonstration of improved function.    Baseline 67/80   Time 6   Period Weeks   Status New     PT LONG TERM GOAL #4   Title Pt will report being able to return to the gym, working on her normal exercise routine with minimal to no R lateral hip pain to promote fitness.    Baseline Patient currently not working out at her gym (involves treadmill walking) due to R lateral hip pain.    Time 6   Period Weeks   Status New               Plan - 01/12/16 1459    Clinical Impression Statement Slight increase in R lateral hip discomfort with hip abduction isometric exercise. Pt education to use less % effort when performing exercise at home to help perform exercise without increasing hip pain. Improved femoral control with sit <> stand after cues. Cues needed to maintain positions during exercises to decrease tension to R hip abductor muscles during session.    Rehab Potential Good   Clinical Impairments Affecting Rehab Potential chronicity of condition   PT Frequency 2x / week   PT Duration 6 weeks   PT Treatment/Interventions Therapeutic exercise;Neuromuscular re-education;Therapeutic activities;Patient/family education;Dry needling;Ultrasound;Iontophoresis 4mg /ml Dexamethasone;Electrical Stimulation;Aquatic Therapy;Manual  techniques   PT Next Visit Plan glute med, max strengthening, femoral control, modalities PRN   Consulted and Agree with Plan of Care Patient      Patient will benefit from skilled therapeutic intervention in order to improve the following deficits and impairments:  Pain, Improper body mechanics, Difficulty walking, Decreased strength  Visit Diagnosis: Pain in right hip  Muscle weakness (generalized)  Difficulty in walking, not elsewhere classified     Problem  List Patient Active Problem List   Diagnosis Date Noted  . Hyperlipidemia 11/09/2014  . Depression 11/09/2014  . Acute anxiety 11/09/2014  . Bipolar 2 disorder ([place]) 11/09/2014  . Obesity 11/09/2014    Joneen Boers PT, DPT   01/12/2016, 3:33 PM  [facility] [address] [place], [address] Phone: [phone]   Fax:  [phone]  Name: Gabriella Hensley MRN: [id number] Date of Birth: Apr 08, 1965

## 2016-01-12 NOTE — Patient Instructions (Addendum)
  On your back and belt strapped above your knees (feet at least shoulder width apart:   Press your right thigh against belt with 40 % effort.    Hold for 1 minute.    Rest for 1 minute.    Repeat 5 times    Perform 3 times daily.     Reviewed positions to hold off on which increases tension on glute med and min tendons. Handout provided. Pt demonstrated and verbalized understanding.

## 2016-01-17 ENCOUNTER — Ambulatory Visit: Payer: BLUE CROSS/BLUE SHIELD

## 2016-01-17 DIAGNOSIS — M6281 Muscle weakness (generalized): Secondary | ICD-10-CM | POA: Diagnosis not present

## 2016-01-17 DIAGNOSIS — M25551 Pain in right hip: Secondary | ICD-10-CM | POA: Diagnosis not present

## 2016-01-17 DIAGNOSIS — R262 Difficulty in walking, not elsewhere classified: Secondary | ICD-10-CM | POA: Diagnosis not present

## 2016-01-17 NOTE — Patient Instructions (Addendum)
  Modified her HEP to seated clamshell, sitting on her bed so that her hips are less than 90 degrees flexion. Pt demonstrated and verbalized understanding.

## 2016-01-17 NOTE — Therapy (Signed)
[facility] [address], [place], [address] Phone: [phone]   Fax:  [phone]  Physical Therapy Treatment  Patient Details  Name: Gabriella Hensley MRN: [id number] Date of Birth: December 20, 1965 Referring Provider: Kathrine Haddock, NP  Encounter Date: 01/17/2016      PT End of Session - 01/17/16 1434    Visit Number 3   Number of Visits 13   Date for PT Re-Evaluation 02/23/16   PT Start Time [id number]   PT Stop Time 1515   PT Time Calculation (min) 41 min   Activity Tolerance Patient tolerated treatment well   Behavior During Therapy [facility] for tasks assessed/performed      Past Medical History:  Diagnosis Date  . Bipolar 1 disorder ([place])   . Depression   . Hypertension     Past Surgical History:  Procedure Laterality Date  . ABDOMINAL HYSTERECTOMY    . APPENDECTOMY      There were no vitals filed for this visit.      Subjective Assessment - 01/17/16 1435    Subjective R hip is a little agitated today. Has been up since 3 am because of having to go to work. Just got out of work. 6/10 R hip pain currently.   R hip bothered her the day after last session. Got better once she started moving around more.   Had a busy weekend including Sunday.  The exercises at home do not bother her hip.    Pertinent History R hip pain. Gradual onset. The main time her hip bothers her, she is in bed. Laying on her R side bothers her hip and causes her R LE (mainly the L4,L5, S1 dermatome) to go numb all the way down to her foot. Has had her symptoms for about a year. Unknown method of injury. Pt also states that walking for about an hour bothers her hip. Pt states that her R heel hurts as well.  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 Going out on the floor and walk 5-6 times throughout the day sometimes bothers her.    Patient Stated Goals So  I can lay on my right side, be able to exercise, go back to the gym (involves the treadmill)   Currently in Pain? Yes   Pain Score 6    Pain Onset More than a month ago   Multiple Pain Sites No                                 PT Education - 01/17/16 1444    Education provided Yes   Education Details ther-ex   Person(s) Educated Patient   Methods Explanation;Demonstration;Tactile cues;Verbal cues   Comprehension Returned demonstration;Verbalized understanding        Objectives  There-ex  Directed patient with seated clamshell isometrics with 40%-50% effort 1 min x 5 with 1 min rest in between. Hips less than 90 degrees flexion  Modified her HEP to seated clamshell. Pt demonstrated and verbalized understanding.   No R hip pain afterwards.   Side stepping 28 ft each direction (hip abductors always in slack) for 2 sets. Good bilateral glute med muscle work felt towards the end.   Forward wedding march 32 ft (hip abductors always in slack)  Sit <> stand 10x with emphasis on  femoral control. Non-painful patellar crepitus.    SLS on R LE with L tip toe assist with emphasis on level pelvis 10x5 seconds for 2 sets     Improved exercise technique, movement at target joints, use of target muscles after mod verbal, visual, tactile cues.       R lateral hip pain decreased to a 2-3/10 at end of session. Worked on isometric and concentric glute med muscle use and femoral control with cues to decrease tension to distal tendon.            PT Long Term Goals - 01/10/16 1707      PT LONG TERM GOAL #1   Title Patient will have a decrease in R lateral hip pain to 4/10 or less at most when lying on her R side, and 3/10 or less at most if not lying on her R side to promote ability to ambulate as well as to sleep.    Baseline 8/10 at most when lying on her R side, 5/10 at most when not lying on her R side but walking long distances or duration   Time 6    Period Weeks   Status New     PT LONG TERM GOAL #2   Title Patient will imporve R glute med and max strength by at least 1/2 MMT grade to promote ability to ambulate longer distances with less R lateral hip pain.   Time 6   Period Weeks   Status New     PT LONG TERM GOAL #3   Title Patient will improve her LEFS score by at least 9 points as a demonstration of improved function.    Baseline 67/80   Time 6   Period Weeks   Status New     PT LONG TERM GOAL #4   Title Pt will report being able to return to the gym, working on her normal exercise routine with minimal to no R lateral hip pain to promote fitness.    Baseline Patient currently not working out at her gym (involves treadmill walking) due to R lateral hip pain.    Time 6   Period Weeks   Status New               Plan - 01/17/16 1445    Clinical Impression Statement  R lateral hip pain decreased to a 2-3/10 at end of session. Worked on isometric and concentric glute med muscle use and femoral control with cues to decrease tension to distal tendon.    Rehab Potential Good   Clinical Impairments Affecting Rehab Potential chronicity of condition   PT Frequency 2x / week   PT Duration 6 weeks   PT Treatment/Interventions Therapeutic exercise;Neuromuscular re-education;Therapeutic activities;Patient/family education;Dry needling;Ultrasound;Iontophoresis 4mg /ml Dexamethasone;Electrical Stimulation;Aquatic Therapy;Manual techniques   PT Next Visit Plan glute med, max strengthening, femoral control, modalities PRN   Consulted and Agree with Plan of Care Patient      Patient will benefit from skilled therapeutic intervention in order to improve the following deficits and impairments:  Pain, Improper body mechanics, Difficulty walking, Decreased strength  Visit Diagnosis: Pain in right hip  Muscle weakness (generalized)  Difficulty in walking, not elsewhere classified     Problem List Patient Active Problem List    Diagnosis Date Noted  . Hyperlipidemia 11/09/2014  . Depression 11/09/2014  . Acute anxiety 11/09/2014  . Bipolar 2 disorder ([place]) 11/09/2014  . Obesity 11/09/2014    Joneen Boers PT, DPT   01/17/2016, 8:22 PM  [facility] [address], [place], [address] Phone: [phone]   Fax:  [phone]  Name: Gabriella Hensley MRN: [id number] Date of Birth: 24-Apr-1965

## 2016-01-27 DIAGNOSIS — S0500XA Injury of conjunctiva and corneal abrasion without foreign body, unspecified eye, initial encounter: Secondary | ICD-10-CM | POA: Diagnosis not present

## 2016-01-31 ENCOUNTER — Ambulatory Visit: Payer: BLUE CROSS/BLUE SHIELD

## 2016-02-13 ENCOUNTER — Encounter: Payer: Self-pay | Admitting: *Deleted

## 2016-02-14 ENCOUNTER — Ambulatory Visit
Admission: RE | Admit: 2016-02-14 | Discharge: 2016-02-14 | Disposition: A | Discharge status: Home / Self Care | Payer: BLUE CROSS/BLUE SHIELD | Source: Ambulatory Visit | Attending: Gastroenterology | Admitting: Gastroenterology

## 2016-02-14 ENCOUNTER — Ambulatory Visit: Payer: BLUE CROSS/BLUE SHIELD | Admitting: Anesthesiology

## 2016-02-14 ENCOUNTER — Encounter: Payer: Self-pay | Admitting: Anesthesiology

## 2016-02-14 ENCOUNTER — Encounter
Admission: RE | Disposition: A | Discharge status: Home / Self Care | Payer: Self-pay | Source: Ambulatory Visit | Attending: Gastroenterology

## 2016-02-14 DIAGNOSIS — K635 Polyp of colon: Secondary | ICD-10-CM | POA: Diagnosis not present

## 2016-02-14 DIAGNOSIS — D120 Benign neoplasm of cecum: Secondary | ICD-10-CM | POA: Diagnosis not present

## 2016-02-14 DIAGNOSIS — F319 Bipolar disorder, unspecified: Secondary | ICD-10-CM | POA: Insufficient documentation

## 2016-02-14 DIAGNOSIS — K648 Other hemorrhoids: Secondary | ICD-10-CM | POA: Diagnosis not present

## 2016-02-14 DIAGNOSIS — I10 Essential (primary) hypertension: Secondary | ICD-10-CM | POA: Diagnosis not present

## 2016-02-14 DIAGNOSIS — K529 Noninfective gastroenteritis and colitis, unspecified: Secondary | ICD-10-CM | POA: Diagnosis not present

## 2016-02-14 DIAGNOSIS — D121 Benign neoplasm of appendix: Secondary | ICD-10-CM | POA: Insufficient documentation

## 2016-02-14 DIAGNOSIS — D125 Benign neoplasm of sigmoid colon: Secondary | ICD-10-CM

## 2016-02-14 DIAGNOSIS — Z1211 Encounter for screening for malignant neoplasm of colon: Secondary | ICD-10-CM | POA: Diagnosis not present

## 2016-02-14 HISTORY — PX: COLONOSCOPY WITH PROPOFOL: SHX5780

## 2016-02-14 SURGERY — COLONOSCOPY WITH PROPOFOL
Anesthesia: General

## 2016-02-14 MED ORDER — MIDAZOLAM HCL 2 MG/2ML IJ SOLN
INTRAMUSCULAR | Status: DC | PRN
  Administered 2016-02-14: 1 mg via INTRAVENOUS

## 2016-02-14 MED ORDER — PROPOFOL 10 MG/ML IV BOLUS
INTRAVENOUS | Status: DC | PRN
  Administered 2016-02-14: 100 mg via INTRAVENOUS

## 2016-02-14 MED ORDER — LIDOCAINE HCL (PF) 1 % IJ SOLN
2.0000 mL | Freq: Once | INTRAMUSCULAR | Status: AC
  Administered 2016-02-14: 0.3 mL via INTRADERMAL

## 2016-02-14 MED ORDER — PROPOFOL 500 MG/50ML IV EMUL
INTRAVENOUS | Status: DC | PRN
  Administered 2016-02-14: 125 ug/kg/min via INTRAVENOUS

## 2016-02-14 MED ORDER — LIDOCAINE HCL (PF) 1 % IJ SOLN
INTRAMUSCULAR | Status: AC
  Administered 2016-02-14: 0.3 mL via INTRADERMAL
  Filled 2016-02-14: qty 2

## 2016-02-14 MED ORDER — SODIUM CHLORIDE 0.9 % IV SOLN
INTRAVENOUS | Status: DC
  Administered 2016-02-14: 1000 mL via INTRAVENOUS

## 2016-02-14 NOTE — Transfer of Care (Signed)
Immediate Anesthesia Transfer of Care Note  Patient: Gabriella Hensley  Procedure(s) Performed: Procedure(s): COLONOSCOPY WITH PROPOFOL (N/A)  Patient Location: PACU  Anesthesia Type:General  Level of Consciousness: awake and alert   Airway & Oxygen Therapy: Patient Spontanous Breathing and Patient connected to nasal cannula oxygen  Post-op Assessment: Report given to RN and Post -op Vital signs reviewed and stable  Post vital signs: Reviewed and stable  Last Vitals:  Vitals:   02/14/16 0816 02/14/16 0856  BP: (!) 156/86 109/66  Pulse: (!) 57 (!) 55  Resp: 18 10  Temp: (!) 35.8 C 36.2 C    Last Pain:  Vitals:   02/14/16 0856  TempSrc: Tympanic         Complications: No apparent anesthesia complications

## 2016-02-14 NOTE — Op Note (Signed)
[facility] Gastroenterology Patient Name: Gabriella Hensley Procedure Date: 02/14/2016 8:38 AM MRN: [id number] Account #: [id number] Date of Birth: 08/16/1965 Admit Type: Outpatient Age: 50 Room: [facility] ENDO ROOM 4 Gender: Female Note Status: Finalized Procedure:            Colonoscopy Indications:          Screening for colorectal malignant neoplasm Providers:            Lucilla Lame MD, MD Referring MD:         Kathrine Haddock (Referring MD) Medicines:            Propofol per Anesthesia Complications:        No immediate complications. Procedure:            Pre-Anesthesia Assessment:                       - Prior to the procedure, a History and Physical was                        performed, and patient medications and allergies were                        reviewed. The patient's tolerance of previous                        anesthesia was also reviewed. The risks and benefits of                        the procedure and the sedation options and risks were                        discussed with the patient. All questions were                        answered, and informed consent was obtained. Prior                        Anticoagulants: The patient has taken no previous                        anticoagulant or antiplatelet agents. ASA Grade                        Assessment: II - A patient with mild systemic disease.                        After reviewing the risks and benefits, the patient was                        deemed in satisfactory condition to undergo the                        procedure.                       After obtaining informed consent, the colonoscope was                        passed under direct vision. Throughout the procedure,  the patient's blood pressure, pulse, and oxygen                        saturations were monitored continuously. The                        Colonoscope was introduced through the anus and    advanced to the the cecum, identified by appendiceal                        orifice and ileocecal valve. The colonoscopy was                        performed without difficulty. The patient tolerated the                        procedure well. The quality of the bowel preparation                        was excellent. Findings:      The perianal and digital rectal examinations were normal.      A 12 mm polyp was found in the appendiceal orifice. The polyp was flat.       Biopsies were taken with a cold forceps for histology.      Four sessile polyps were found in the sigmoid colon. The polyps were 2       to 4 mm in size. These polyps were removed with a cold biopsy forceps.       Resection and retrieval were complete.      Non-bleeding internal hemorrhoids were found during retroflexion. The       hemorrhoids were Grade II (internal hemorrhoids that prolapse but reduce       spontaneously). Impression:           - One 12 mm polyp at the appendiceal orifice. Biopsied.                       - Four 2 to 4 mm polyps in the sigmoid colon, removed                        with a cold biopsy forceps. Resected and retrieved.                       - Non-bleeding internal hemorrhoids. Recommendation:       - Discharge patient to home.                       - Resume previous diet.                       - Continue present medications.                       - Await pathology results.                       - If cecal polyp adenomatous then surgical resection. Procedure Code(s):    --- Professional ---                       [phone], Colonoscopy, flexible; with biopsy, single or  multiple Diagnosis Code(s):    --- Professional ---                       Z12.11, Encounter for screening for malignant neoplasm                        of colon                       D12.1, Benign neoplasm of appendix                       D12.5, Benign neoplasm of sigmoid colon CPT copyright 2016 American  Medical Association. All rights reserved. The codes documented in this report are preliminary and upon coder review may  be revised to meet current compliance requirements. Lucilla Lame MD, MD 02/14/2016 8:55:08 AM This report has been signed electronically. Number of Addenda: 0 Note Initiated On: 02/14/2016 8:38 AM Scope Withdrawal Time: 0 hours 7 minutes 24 seconds  Total Procedure Duration: 0 hours 9 minutes 38 seconds       [facility]

## 2016-02-14 NOTE — Anesthesia Preprocedure Evaluation (Addendum)
Anesthesia Evaluation  Patient identified by MRN, date of birth, ID band Patient awake    Reviewed: Allergy & Precautions, NPO status , Patient's Chart, lab work & pertinent test results, reviewed documented beta blocker date and time   Airway Mallampati: III  TM Distance: >3 FB     Dental  (+) Chipped   Pulmonary           Cardiovascular hypertension, Pt. on medications      Neuro/Psych PSYCHIATRIC DISORDERS Anxiety Depression Bipolar Disorder    GI/Hepatic   Endo/Other    Renal/GU      Musculoskeletal   Abdominal   Peds  Hematology   Anesthesia Other Findings   Reproductive/Obstetrics                             Anesthesia Physical Anesthesia Plan  ASA: III  Anesthesia Plan: General   Post-op Pain Management:    Induction: Intravenous  Airway Management Planned: Nasal Cannula  Additional Equipment:   Intra-op Plan:   Post-operative Plan:   Informed Consent: I have reviewed the patients History and Physical, chart, labs and discussed the procedure including the risks, benefits and alternatives for the proposed anesthesia with the patient or authorized representative who has indicated his/her understanding and acceptance.     Plan Discussed with: CRNA  Anesthesia Plan Comments:         Anesthesia Quick Evaluation

## 2016-02-14 NOTE — H&P (Signed)
  Lucilla Lame, MD [facility] [address], [place], [place] [address] Phone: [phone] Fax : [phone]  Primary Care Physician:  Kathrine Haddock, NP Primary Gastroenterologist:  Dr. Allen Norris  Pre-Procedure History & Physical: HPI:  Gabriella Hensley is a 50 y.o. female is here for a screening colonoscopy.   Past Medical History:  Diagnosis Date  . Bipolar 1 disorder ([place])   . Depression   . Hypertension     Past Surgical History:  Procedure Laterality Date  . ABDOMINAL HYSTERECTOMY    . APPENDECTOMY      Prior to Admission medications   Medication Sig Start Date End Date Taking? Authorizing Provider  buPROPion (ZYBAN) 150 MG 12 hr tablet Take 1 tablet (150 mg total) by mouth daily. 12/09/15   Kathrine Haddock, NP  risperiDONE (RISPERDAL) 3 MG tablet Take 1 tablet (3 mg total) by mouth daily. 12/09/15   Kathrine Haddock, NP    Allergies as of 12/23/2015  . (No Known Allergies)    Family History  Problem Relation Age of Onset  . Hypertension Mother   . Cancer Mother     ovarian  . Hypertension Son   . Heart disease Maternal Grandfather   . Breast cancer Neg Hx     Social History   Social History  . Marital status: Married    Spouse name: N/A  . Number of children: N/A  . Years of education: N/A   Occupational History  . Not on file.   Social History Main Topics  . Smoking status: Never Smoker  . Smokeless tobacco: Never Used  . Alcohol use No  . Drug use: No  . Sexual activity: Yes   Other Topics Concern  . Not on file   Social History Narrative  . No narrative on file    Review of Systems: See HPI, otherwise negative ROS  Physical Exam: BP (!) 156/86   Pulse (!) 57   Temp (!) 96.4 F (35.8 C) (Tympanic)   Resp 18   Ht 5\' 8"  (1.727 m)   Wt 235 lb (106.6 kg)   LMP  (LMP Unknown)   SpO2 100%   BMI 35.73 kg/m  General:   Alert,  pleasant and cooperative in NAD Head:  Normocephalic and atraumatic. Neck:  Supple; no masses or thyromegaly. Lungs:   Clear throughout to auscultation.    Heart:  Regular rate and rhythm. Abdomen:  Soft, nontender and nondistended. Normal bowel sounds, without guarding, and without rebound.   Neurologic:  Alert and  oriented x4;  grossly normal neurologically.  Impression/Plan: Gabriella Hensley is now here to undergo a screening colonoscopy.  Risks, benefits, and alternatives regarding colonoscopy have been reviewed with the patient.  Questions have been answered.  All parties agreeable.

## 2016-02-15 ENCOUNTER — Encounter: Payer: Self-pay | Admitting: Gastroenterology

## 2016-02-15 LAB — SURGICAL PATHOLOGY

## 2016-02-15 NOTE — Anesthesia Postprocedure Evaluation (Signed)
Anesthesia Post Note  Patient: Gabriella Hensley  Procedure(s) Performed: Procedure(s) (LRB): COLONOSCOPY WITH PROPOFOL (N/A)  Patient location during evaluation: PACU Anesthesia Type: General Level of consciousness: awake and alert and oriented Pain management: pain level controlled Vital Signs Assessment: post-procedure vital signs reviewed and stable Respiratory status: spontaneous breathing Cardiovascular status: blood pressure returned to baseline Anesthetic complications: no    Last Vitals:  Vitals:   02/14/16 0916 02/14/16 0926  BP: 127/74 137/85  Pulse: (!) 52 (!) 53  Resp: 16 17  Temp:      Last Pain:  Vitals:   02/14/16 0856  TempSrc: Tympanic                 [name]

## 2016-02-24 ENCOUNTER — Telehealth: Payer: Self-pay

## 2016-02-24 NOTE — Telephone Encounter (Signed)
LVM for pt to return my call to schedule colonoscopy results appt.

## 2016-02-24 NOTE — Telephone Encounter (Signed)
-----   Message from Lucilla Lame, MD sent at 02/19/2016  8:52 AM EST ----- Please have the patient come in for a follow up.

## 2016-03-02 NOTE — Telephone Encounter (Signed)
Pt scheduled for a follow up appt with Dr. Allen Norris next Thursday, Dec 14th.

## 2016-03-08 ENCOUNTER — Ambulatory Visit (INDEPENDENT_AMBULATORY_CARE_PROVIDER_SITE_OTHER): Payer: BLUE CROSS/BLUE SHIELD | Admitting: Gastroenterology

## 2016-03-08 ENCOUNTER — Other Ambulatory Visit: Payer: Self-pay

## 2016-03-08 ENCOUNTER — Ambulatory Visit: Payer: Self-pay | Admitting: Gastroenterology

## 2016-03-08 ENCOUNTER — Encounter: Payer: Self-pay | Admitting: Gastroenterology

## 2016-03-08 VITALS — BP 129/85 | HR 57 | Temp 97.8°F | Ht 67.0 in | Wt 238.0 lb

## 2016-03-08 DIAGNOSIS — K529 Noninfective gastroenteritis and colitis, unspecified: Secondary | ICD-10-CM

## 2016-03-08 DIAGNOSIS — K635 Polyp of colon: Secondary | ICD-10-CM

## 2016-03-08 NOTE — Progress Notes (Signed)
   Primary Care Physician: Kathrine Haddock, NP  Primary Gastroenterologist:  Dr. Lucilla Lame  Chief Complaint  Patient presents with  . Follow up Colonoscopy results    HPI: Gabriella Hensley is a 50 y.o. female here for follow-up after having a colonoscopy. The patient had a hyperplastic polyp but a erythematous area the cecum was biopsy that showed her to have chronic active colitis. The patient denies any symptoms. The pathology report of this could be due to infection, NSAIDs or inflammatory bowel disease. The rest of the colon mucosa appeared normal.  Current Outpatient Prescriptions  Medication Sig Dispense Refill  . buPROPion (ZYBAN) 150 MG 12 hr tablet Take 1 tablet (150 mg total) by mouth daily. 90 tablet 1  . ofloxacin (OCUFLOX) 0.3 % ophthalmic solution     . risperiDONE (RISPERDAL) 3 MG tablet Take 1 tablet (3 mg total) by mouth daily. 90 tablet 3   No current facility-administered medications for this visit.     Allergies as of 03/08/2016  . (No Known Allergies)    ROS:  General: Negative for anorexia, weight loss, fever, chills, fatigue, weakness. ENT: Negative for hoarseness, difficulty swallowing , nasal congestion. CV: Negative for chest pain, angina, palpitations, dyspnea on exertion, peripheral edema.  Respiratory: Negative for dyspnea at rest, dyspnea on exertion, cough, sputum, wheezing.  GI: See history of present illness. GU:  Negative for dysuria, hematuria, urinary incontinence, urinary frequency, nocturnal urination.  Endo: Negative for unusual weight change.    Physical Examination:   BP 129/85   Pulse (!) 57   Temp 97.8 F (36.6 C) (Oral)   Ht 5\' 7"  (1.702 m)   Wt 238 lb (108 kg)   LMP  (LMP Unknown)   BMI 37.28 kg/m   General: Well-nourished, well-developed in no acute distress.  Eyes: No icterus. Conjunctivae pink. Neuro: Alert and oriented x 3.  Grossly intact. Skin: Warm and dry, no jaundice.   Psych: Alert and cooperative, normal mood and  affect.  Labs:    Imaging Studies: No results found.  Assessment and Plan:   Gabriella Hensley is a 50 y.o. y/o female who had a colonoscopy with a hyperplastic polyp. The patient was also found to have a red patch in the cecum that was biopsied that showed her to have chronic active colitis. The patient has no symptoms and has been told that it would be difficult to judge any response to any treatment without her having any symptoms. The patient also has been told that if she develops any symptoms to contact me immediately whereupon she may need to be started on treatment. The patient been explained the plan and agrees with it.    Lucilla Lame, MD. Marval Regal   Note: This dictation was prepared with Dragon dictation along with smaller phrase technology. Any transcriptional errors that result from this process are unintentional.

## 2016-05-22 ENCOUNTER — Ambulatory Visit: Admission: RE | Admit: 2016-05-22 | Payer: BLUE CROSS/BLUE SHIELD | Source: Ambulatory Visit | Admitting: *Deleted

## 2016-05-22 ENCOUNTER — Other Ambulatory Visit: Payer: Self-pay | Admitting: Family

## 2016-05-22 ENCOUNTER — Ambulatory Visit
Admission: RE | Admit: 2016-05-22 | Discharge: 2016-05-22 | Disposition: A | Discharge status: Home / Self Care | Payer: Worker's Compensation | Source: Ambulatory Visit | Attending: Family | Admitting: Family

## 2016-05-22 DIAGNOSIS — T1490XA Injury, unspecified, initial encounter: Secondary | ICD-10-CM

## 2016-05-22 DIAGNOSIS — M25572 Pain in left ankle and joints of left foot: Secondary | ICD-10-CM | POA: Diagnosis present

## 2016-05-22 DIAGNOSIS — S99922A Unspecified injury of left foot, initial encounter: Secondary | ICD-10-CM | POA: Insufficient documentation

## 2016-05-22 DIAGNOSIS — M7732 Calcaneal spur, left foot: Secondary | ICD-10-CM | POA: Diagnosis not present

## 2016-05-28 ENCOUNTER — Encounter: Payer: Self-pay | Admitting: Unknown Physician Specialty

## 2016-06-08 ENCOUNTER — Ambulatory Visit: Payer: BLUE CROSS/BLUE SHIELD | Admitting: Unknown Physician Specialty

## 2016-06-11 ENCOUNTER — Ambulatory Visit: Payer: BLUE CROSS/BLUE SHIELD | Admitting: Unknown Physician Specialty

## 2016-06-12 ENCOUNTER — Ambulatory Visit (INDEPENDENT_AMBULATORY_CARE_PROVIDER_SITE_OTHER): Payer: BLUE CROSS/BLUE SHIELD | Admitting: Unknown Physician Specialty

## 2016-06-12 ENCOUNTER — Encounter: Payer: Self-pay | Admitting: Unknown Physician Specialty

## 2016-06-12 DIAGNOSIS — R1013 Epigastric pain: Secondary | ICD-10-CM

## 2016-06-12 DIAGNOSIS — F3181 Bipolar II disorder: Secondary | ICD-10-CM

## 2016-06-12 MED ORDER — BUPROPION HCL ER (SMOKING DET) 150 MG PO TB12: 150.0000 mg | ORAL_TABLET | Freq: Every day | ORAL | 1 refills | Status: DC

## 2016-06-12 NOTE — Assessment & Plan Note (Signed)
Stable, continue present medications.   

## 2016-06-12 NOTE — Progress Notes (Signed)
BP 131/83 (BP Location: Left Arm, Patient Position: Sitting, Cuff Size: Large)   Pulse 64   Temp 97.7 F (36.5 C)   Ht 5' 7.3" (1.709 m) Comment: pt had shoes on  Wt 244 lb 1.6 oz (110.7 kg) Comment: pt had shoes on  LMP  (LMP Unknown)   SpO2 99%   BMI 37.89 kg/m    Subjective:    Patient ID: Gabriella Hensley, female    DOB: [birth year], 51 y.o.   MRN: [id number]  HPI: Gabriella Hensley is a 51 y.o. female  Chief Complaint  Patient presents with  . Depression   Depression Doing well without complaints Depression screen [facility] 2/9 06/12/2016 05/31/2015 12/01/2014  Decreased Interest 0 0 0  Down, Depressed, Hopeless 0 0 0  PHQ - 2 Score 0 0 0   Abdominal pain Pt states she is having several days of epigastric pain that improves with eating.  Describes it as an aching pain.  No worsening factors.  No change in bowel movements or urine.    Relevant past medical, surgical, family and social history reviewed and updated as indicated. Interim medical history since our last visit reviewed. Allergies and medications reviewed and updated.  Review of Systems  Constitutional: Negative.   HENT: Negative.   Eyes: Negative.   Respiratory: Negative.   Cardiovascular: Negative.   Endocrine: Negative.   Genitourinary: Negative.   Musculoskeletal: Negative.   Skin: Negative.   Allergic/Immunologic: Negative.   Neurological: Negative.   Hematological: Negative.   Psychiatric/Behavioral: Negative.     Per HPI unless specifically indicated above     Objective:    BP 131/83 (BP Location: Left Arm, Patient Position: Sitting, Cuff Size: Large)   Pulse 64   Temp 97.7 F (36.5 C)   Ht 5' 7.3" (1.709 m) Comment: pt had shoes on  Wt 244 lb 1.6 oz (110.7 kg) Comment: pt had shoes on  LMP  (LMP Unknown)   SpO2 99%   BMI 37.89 kg/m   Wt Readings from Last 3 Encounters:  06/12/16 244 lb 1.6 oz (110.7 kg)  03/08/16 238 lb (108 kg)  02/14/16 235 lb (106.6 kg)    Physical Exam  Constitutional:  She is oriented to person, place, and time. She appears well-developed and well-nourished. No distress.  HENT:  Head: Normocephalic and atraumatic.  Eyes: Conjunctivae and lids are normal. Right eye exhibits no discharge. Left eye exhibits no discharge. No scleral icterus.  Neck: Normal range of motion. Neck supple. No JVD present. Carotid bruit is not present.  Cardiovascular: Normal rate, regular rhythm and normal heart sounds.   Pulmonary/Chest: Effort normal and breath sounds normal.  Abdominal: Soft. Normal appearance. She exhibits no distension. There is no splenomegaly or hepatomegaly. There is no tenderness.  Musculoskeletal: Normal range of motion.  Neurological: She is alert and oriented to person, place, and time.  Skin: Skin is warm, dry and intact. No rash noted. No pallor.  Psychiatric: She has a normal mood and affect. Her behavior is normal. Judgment and thought content normal.    Results for orders placed or performed during the hospital encounter of 02/14/16  Surgical pathology  Result Value Ref Range   SURGICAL PATHOLOGY      Surgical Pathology CASE: [id number] PATIENT: Sandrina Almendarez Surgical Pathology Report     SPECIMEN SUBMITTED: A. Colon polyp,cecum; cbx B. Colon polyp x4, sigmoid; cbx  CLINICAL HISTORY: None provided  PRE-OPERATIVE DIAGNOSIS: Screening colonoscopy Z12.11  POST-OPERATIVE DIAGNOSIS: None provided.  DIAGNOSIS: A. COLON POLYP, CECUM; COLD BIOPSY: - MODERATE CHRONIC ACTIVE COLITIS, SEE COMMENT. - NEGATIVE FOR DYSPLASIA AND MALIGNANCY.  Comment: Colitis with chronic features is present in all fragments in part A. There is mild crypt distortion and moderate mixed chronic inflammation expanding the lamina propria and extending into the submucosa. Active inflammation mostly involves surface epithelium, with focal minimal cryptitis. No granulomas are seen. The inflammatory infiltrate shows no cytologic atypia. Findings of this  type could be related to medications such as NSAIDs, or infection. Inflammatory bowel disease is not ruled out. Clinical correlatio n is recommended.  B. COLON POLYP X 4, SIGMOID; COLD BIOPSY: - HYPERPLASTIC POLYPS, 4 FRAGMENTS. - NEGATIVE FOR DYSPLASIA AND MALIGNANCY.   GROSS DESCRIPTION: A. Labeled: C BX cecum colon polyp Tissue fragment(s): 3 Size: less than 0.1-0.2 cm Description: tan fragments  Entirely submitted in 1 cassette(s).   B. Labeled: C BX sigmoid colon polyp x 4 Tissue fragment(s): 4 Size: 0.1 and 0.4 cm Description: pink-tan fragments  Entirely submitted in one cassette(s).  Final Diagnosis performed by Bryan Lemma, MD.  Electronically signed 02/15/2016 6:03:05PM    The electronic signature indicates that the named Attending Pathologist has evaluated the specimen  Technical component performed at [facility], [address] [place], [place] [address] Lab: [phone] Dir: Darrick Penna. Evette Doffing, MD  Professional component performed at [facility], [facility], [place], [place], [place] [address] Lab: [phone] Dir: Dellia Nims. Reuel Derby, MD        Assessment & Plan:   Problem List Items Addressed This Visit      Unprioritized   Bipolar 2 disorder ([place])    Stable, continue present medications.        Epigastric pain    New problem.  Recommended Zantac OTC for about 2 weeks          Follow up plan: Return in about 6 months (around 12/13/2016).

## 2016-06-12 NOTE — Assessment & Plan Note (Signed)
New problem.  Recommended Zantac OTC for about 2 weeks

## 2016-07-25 ENCOUNTER — Encounter: Payer: Self-pay | Admitting: Unknown Physician Specialty

## 2016-09-06 ENCOUNTER — Other Ambulatory Visit: Payer: Self-pay | Admitting: Unknown Physician Specialty

## 2016-09-06 DIAGNOSIS — Z1231 Encounter for screening mammogram for malignant neoplasm of breast: Secondary | ICD-10-CM

## 2016-09-25 ENCOUNTER — Ambulatory Visit
Admission: RE | Admit: 2016-09-25 | Discharge: 2016-09-25 | Disposition: A | Discharge status: Home / Self Care | Payer: BLUE CROSS/BLUE SHIELD | Source: Ambulatory Visit | Attending: Unknown Physician Specialty | Admitting: Unknown Physician Specialty

## 2016-09-25 DIAGNOSIS — Z1231 Encounter for screening mammogram for malignant neoplasm of breast: Secondary | ICD-10-CM | POA: Diagnosis not present

## 2016-10-31 DIAGNOSIS — H524 Presbyopia: Secondary | ICD-10-CM | POA: Diagnosis not present

## 2016-12-19 ENCOUNTER — Encounter: Payer: Self-pay | Admitting: Unknown Physician Specialty

## 2016-12-19 ENCOUNTER — Ambulatory Visit (INDEPENDENT_AMBULATORY_CARE_PROVIDER_SITE_OTHER): Payer: BLUE CROSS/BLUE SHIELD | Admitting: Unknown Physician Specialty

## 2016-12-19 ENCOUNTER — Other Ambulatory Visit: Payer: Self-pay | Admitting: Unknown Physician Specialty

## 2016-12-19 VITALS — BP 121/83 | HR 60 | Temp 98.1°F | Ht 66.6 in | Wt 242.1 lb

## 2016-12-19 DIAGNOSIS — Z0000 Encounter for general adult medical examination without abnormal findings: Secondary | ICD-10-CM

## 2016-12-19 DIAGNOSIS — E669 Obesity, unspecified: Secondary | ICD-10-CM

## 2016-12-19 DIAGNOSIS — K219 Gastro-esophageal reflux disease without esophagitis: Secondary | ICD-10-CM

## 2016-12-19 DIAGNOSIS — E6609 Other obesity due to excess calories: Secondary | ICD-10-CM

## 2016-12-19 DIAGNOSIS — Z6838 Body mass index (BMI) 38.0-38.9, adult: Secondary | ICD-10-CM

## 2016-12-19 DIAGNOSIS — F3181 Bipolar II disorder: Secondary | ICD-10-CM | POA: Diagnosis not present

## 2016-12-19 MED ORDER — BUPROPION HCL ER (SMOKING DET) 150 MG PO TB12: 150.0000 mg | ORAL_TABLET | Freq: Every day | ORAL | 1 refills | Status: DC

## 2016-12-19 MED ORDER — RISPERIDONE 3 MG PO TABS: 3.0000 mg | ORAL_TABLET | Freq: Every day | ORAL | 3 refills | Status: DC

## 2016-12-19 MED ORDER — OMEPRAZOLE 20 MG PO CPDR: 20.0000 mg | DELAYED_RELEASE_CAPSULE | Freq: Every day | ORAL | 3 refills | Status: DC

## 2016-12-19 NOTE — Progress Notes (Signed)
BP 121/83   Pulse 60   Temp 98.1 F (36.7 C)   Ht 5' 6.6" (1.692 m)   Wt 242 lb 1.6 oz (109.8 kg)   LMP  (LMP Unknown)   SpO2 99%   BMI 38.38 kg/m    Subjective:    Patient ID: Gabriella Hensley, female    DOB: [birth year], 51 y.o.   MRN: [id number]  HPI: Kimbery REATHA SUR is a 51 y.o. female  Chief Complaint  Patient presents with  . Annual Exam   Abdominal pain Pt having epigastric pain when she eats.  States fried foods bother her the most.    Depression/bipolar Doing well Depression screen [facility] 2/9 12/19/2016 06/12/2016 05/31/2015 12/01/2014  Decreased Interest 0 0 0 0  Down, Depressed, Hopeless 0 0 0 0  PHQ - 2 Score 0 0 0 0  Altered sleeping 0 - - -  Tired, decreased energy 0 - - -  Change in appetite 1 - - -  Feeling bad or failure about yourself  0 - - -  Trouble concentrating 0 - - -  Moving slowly or fidgety/restless 0 - - -  Suicidal thoughts 0 - - -  PHQ-9 Score 1 - - -    Relevant past medical, surgical, family and social history reviewed and updated as indicated. Interim medical history since our last visit reviewed. Allergies and medications reviewed and updated.  Review of Systems  Per HPI unless specifically indicated above     Objective:    BP 121/83   Pulse 60   Temp 98.1 F (36.7 C)   Ht 5' 6.6" (1.692 m)   Wt 242 lb 1.6 oz (109.8 kg)   LMP  (LMP Unknown)   SpO2 99%   BMI 38.38 kg/m   Wt Readings from Last 3 Encounters:  12/19/16 242 lb 1.6 oz (109.8 kg)  06/12/16 244 lb 1.6 oz (110.7 kg)  03/08/16 238 lb (108 kg)    Physical Exam  Constitutional: She is oriented to person, place, and time. She appears well-developed and well-nourished.  HENT:  Head: Normocephalic and atraumatic.  Eyes: Pupils are equal, round, and reactive to light. Right eye exhibits no discharge. Left eye exhibits no discharge. No scleral icterus.  Neck: Normal range of motion. Neck supple. Carotid bruit is not present. No thyromegaly present.  Cardiovascular: Normal  rate, regular rhythm and normal heart sounds.  Exam reveals no gallop and no friction rub.   No murmur heard. Pulmonary/Chest: Effort normal and breath sounds normal. No respiratory distress. She has no wheezes. She has no rales.  Abdominal: Soft. Bowel sounds are normal. There is no tenderness. There is no rebound.  Genitourinary: No breast swelling, tenderness or discharge.  Musculoskeletal: Normal range of motion.  Lymphadenopathy:    She has no cervical adenopathy.  Neurological: She is alert and oriented to person, place, and time.  Skin: Skin is warm, dry and intact. No rash noted.  Psychiatric: She has a normal mood and affect. Her speech is normal and behavior is normal. Judgment and thought content normal. Cognition and memory are normal.      Assessment & Plan:   Problem List Items Addressed This Visit      Unprioritized   Bipolar 2 disorder ([place])    Stable, continue present medications.        GERD (gastroesophageal reflux disease)    RX for Omeprazole 20 mg      Relevant Medications   omeprazole (PRILOSEC) 20 MG  capsule   Obesity    Discussed diet and exercese.  Encouraged to join [employer]      Obesity (BMI 35.0-39.9 without comorbidity)    Other Visit Diagnoses    Annual physical exam    -  Primary   Relevant Orders   CBC with Differential/Platelet   Comprehensive metabolic panel   Lipid Panel w/o Chol/HDL Ratio   TSH       Follow up plan: Return in about 6 months (around 06/18/2017).

## 2016-12-19 NOTE — Telephone Encounter (Signed)
Patient requesting refill on medications for bupropion and risperidone.  Please Advise.  Thank you

## 2016-12-19 NOTE — Assessment & Plan Note (Signed)
Discussed diet and exercese.  Encouraged to join [employer]

## 2016-12-19 NOTE — Assessment & Plan Note (Signed)
Stable, continue present medications.   

## 2016-12-19 NOTE — Telephone Encounter (Signed)
Routing to provider  

## 2016-12-19 NOTE — Assessment & Plan Note (Signed)
RX for Omeprazole 20 mg

## 2016-12-20 LAB — CBC WITH DIFFERENTIAL/PLATELET
BASOS ABS: 0 10*3/uL (ref 0.0–0.2)
BASOS: 0 %
EOS (ABSOLUTE): 0.1 10*3/uL (ref 0.0–0.4)
Eos: 3 %
HEMOGLOBIN: 11.9 g/dL (ref 11.1–15.9)
Hematocrit: 36.2 % (ref 34.0–46.6)
IMMATURE GRANS (ABS): 0 10*3/uL (ref 0.0–0.1)
Immature Granulocytes: 0 %
LYMPHS ABS: 2.5 10*3/uL (ref 0.7–3.1)
Lymphs: 49 %
MCH: 27.4 pg (ref 26.6–33.0)
MCHC: 32.9 g/dL (ref 31.5–35.7)
MCV: 83 fL (ref 79–97)
MONOCYTES: 7 %
Monocytes Absolute: 0.4 10*3/uL (ref 0.1–0.9)
NEUTROS ABS: 2.1 10*3/uL (ref 1.4–7.0)
Neutrophils: 41 %
Platelets: 248 10*3/uL (ref 150–379)
RBC: 4.34 x10E6/uL (ref 3.77–5.28)
RDW: 13.8 % (ref 12.3–15.4)
WBC: 5.1 10*3/uL (ref 3.4–10.8)

## 2016-12-20 LAB — COMPREHENSIVE METABOLIC PANEL
A/G RATIO: 1.5 (ref 1.2–2.2)
ALBUMIN: 4.4 g/dL (ref 3.5–5.5)
ALK PHOS: 113 IU/L (ref 39–117)
ALT: 18 IU/L (ref 0–32)
AST: 19 IU/L (ref 0–40)
BILIRUBIN TOTAL: 0.7 mg/dL (ref 0.0–1.2)
BUN / CREAT RATIO: 12 (ref 9–23)
BUN: 10 mg/dL (ref 6–24)
CHLORIDE: 100 mmol/L (ref 96–106)
CO2: 26 mmol/L (ref 20–29)
Calcium: 9.7 mg/dL (ref 8.7–10.2)
Creatinine, Ser: 0.81 mg/dL (ref 0.57–1.00)
GFR calc non Af Amer: 85 mL/min/{1.73_m2} (ref >59)
GFR, EST AFRICAN AMERICAN: 98 mL/min/{1.73_m2} (ref >59)
GLOBULIN, TOTAL: 2.9 g/dL (ref 1.5–4.5)
GLUCOSE: 79 mg/dL (ref 65–99)
POTASSIUM: 3.9 mmol/L (ref 3.5–5.2)
SODIUM: 139 mmol/L (ref 134–144)
TOTAL PROTEIN: 7.3 g/dL (ref 6.0–8.5)

## 2016-12-20 LAB — LIPID PANEL W/O CHOL/HDL RATIO
CHOLESTEROL TOTAL: 203 mg/dL — AB (ref 100–199)
HDL: 48 mg/dL (ref >39)
LDL Calculated: 137 mg/dL — ABNORMAL HIGH (ref 0–99)
Triglycerides: 88 mg/dL (ref 0–149)
VLDL CHOLESTEROL CAL: 18 mg/dL (ref 5–40)

## 2016-12-20 LAB — TSH: TSH: 1.5 u[IU]/mL (ref 0.450–4.500)

## 2016-12-20 NOTE — Progress Notes (Signed)
Normal labs.  Pt notified through mychart

## 2017-05-03 ENCOUNTER — Encounter: Payer: Self-pay | Admitting: Unknown Physician Specialty
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
## 2017-06-18 ENCOUNTER — Ambulatory Visit: Payer: BLUE CROSS/BLUE SHIELD | Admitting: Unknown Physician Specialty

## 2017-06-21 ENCOUNTER — Ambulatory Visit: Payer: BLUE CROSS/BLUE SHIELD | Admitting: Unknown Physician Specialty

## 2017-06-21 ENCOUNTER — Encounter: Payer: Self-pay | Admitting: Unknown Physician Specialty

## 2017-06-21 VITALS — BP 123/85 | HR 51 | Temp 98.3°F | Ht 66.5 in | Wt 233.9 lb

## 2017-06-21 DIAGNOSIS — J029 Acute pharyngitis, unspecified: Secondary | ICD-10-CM

## 2017-06-21 DIAGNOSIS — K219 Gastro-esophageal reflux disease without esophagitis: Secondary | ICD-10-CM | POA: Diagnosis not present

## 2017-06-21 DIAGNOSIS — F3181 Bipolar II disorder: Secondary | ICD-10-CM

## 2017-06-21 NOTE — Patient Instructions (Signed)
Sore Throat A sore throat is pain, burning, irritation, or scratchiness in the throat. When you have a sore throat, you may feel pain or tenderness in your throat when you swallow or talk. Many things can cause a sore throat, including:  An infection.  Seasonal allergies.  Dryness in the air.  Irritants, such as smoke or pollution.  Gastroesophageal reflux disease (GERD).  A tumor.  A sore throat is often the first sign of another sickness. It may happen with other symptoms, such as coughing, sneezing, fever, and swollen neck glands. Most sore throats go away without medical treatment. Follow these instructions at home:  Take over-the-counter medicines only as told by your health care provider.  Drink enough fluids to keep your urine clear or pale yellow.  Rest as needed.  To help with pain, try: ? Sipping warm liquids, such as broth, herbal tea, or warm water. ? Eating or drinking cold or frozen liquids, such as frozen ice pops. ? Gargling with a salt-water mixture 3-4 times a day or as needed. To make a salt-water mixture, completely dissolve -1 tsp of salt in 1 cup of warm water. ? Sucking on hard candy or throat lozenges. ? Putting a cool-mist humidifier in your bedroom at night to moisten the air. ? Sitting in the bathroom with the door closed for 5-10 minutes while you run hot water in the shower.  Do not use any tobacco products, such as cigarettes, chewing tobacco, and e-cigarettes. If you need help quitting, ask your health care provider. Contact a health care provider if:  You have a fever for more than 2-3 days.  You have symptoms that last (are persistent) for more than 2-3 days.  Your throat does not get better within 7 days.  You have a fever and your symptoms suddenly get worse. Get help right away if:  You have difficulty breathing.  You cannot swallow fluids, soft foods, or your saliva.  You have increased swelling in your throat or neck.  You have  persistent nausea and vomiting. This information is not intended to replace advice given to you by your health care provider. Make sure you discuss any questions you have with your health care provider. Document Released: 04/19/2004 Document Revised: 11/06/2015 Document Reviewed: 12/31/2014 Elsevier Interactive Patient Education  2018 [place].  Upper Respiratory Infection, Adult Most upper respiratory infections (URIs) are a viral infection of the air passages leading to the lungs. A URI affects the nose, throat, and upper air passages. The most common type of URI is nasopharyngitis and is typically referred to as "the common cold." URIs run their course and usually go away on their own. Most of the time, a URI does not require medical attention, but sometimes a bacterial infection in the upper airways can follow a viral infection. This is called a secondary infection. Sinus and middle ear infections are common types of secondary upper respiratory infections. Bacterial pneumonia can also complicate a URI. A URI can worsen asthma and chronic obstructive pulmonary disease (COPD). Sometimes, these complications can require emergency medical care and may be life threatening. What are the causes? Almost all URIs are caused by viruses. A virus is a type of germ and can spread from one person to another. What increases the risk? You may be at risk for a URI if:  You smoke.  You have chronic heart or lung disease.  You have a weakened defense (immune) system.  You are very young or very old.  You  have nasal allergies or asthma.  You work in crowded or poorly ventilated areas.  You work in health care facilities or schools.  What are the signs or symptoms? Symptoms typically develop 2-3 days after you come in contact with a cold virus. Most viral URIs last 7-10 days. However, viral URIs from the influenza virus (flu virus) can last 14-18 days and are typically more severe. Symptoms may  include:  Runny or stuffy (congested) nose.  Sneezing.  Cough.  Sore throat.  Headache.  Fatigue.  Fever.  Loss of appetite.  Pain in your forehead, behind your eyes, and over your cheekbones (sinus pain).  Muscle aches.  How is this diagnosed? Your health care provider may diagnose a URI by:  Physical exam.  Tests to check that your symptoms are not due to another condition such as: ? Strep throat. ? Sinusitis. ? Pneumonia. ? Asthma.  How is this treated? A URI goes away on its own with time. It cannot be cured with medicines, but medicines may be prescribed or recommended to relieve symptoms. Medicines may help:  Reduce your fever.  Reduce your cough.  Relieve nasal congestion.  Follow these instructions at home:  Take medicines only as directed by your health care provider.  Gargle warm saltwater or take cough drops to comfort your throat as directed by your health care provider.  Use a warm mist humidifier or inhale steam from a shower to increase air moisture. This may make it easier to breathe.  Drink enough fluid to keep your urine clear or pale yellow.  Eat soups and other clear broths and maintain good nutrition.  Rest as needed.  Return to work when your temperature has returned to normal or as your health care provider advises. You may need to stay home longer to avoid infecting others. You can also use a face mask and careful hand washing to prevent spread of the virus.  Increase the usage of your inhaler if you have asthma.  Do not use any tobacco products, including cigarettes, chewing tobacco, or electronic cigarettes. If you need help quitting, ask your health care provider. How is this prevented? The best way to protect yourself from getting a cold is to practice good hygiene.  Avoid oral or hand contact with people with cold symptoms.  Wash your hands often if contact occurs.  There is no clear evidence that vitamin C, vitamin E,  echinacea, or exercise reduces the chance of developing a cold. However, it is always recommended to get plenty of rest, exercise, and practice good nutrition. Contact a health care provider if:  You are getting worse rather than better.  Your symptoms are not controlled by medicine.  You have chills.  You have worsening shortness of breath.  You have brown or red mucus.  You have yellow or brown nasal discharge.  You have pain in your face, especially when you bend forward.  You have a fever.  You have swollen neck glands.  You have pain while swallowing.  You have white areas in the back of your throat. Get help right away if:  You have severe or persistent: ? Headache. ? Ear pain. ? Sinus pain. ? Chest pain.  You have chronic lung disease and any of the following: ? Wheezing. ? Prolonged cough. ? Coughing up blood. ? A change in your usual mucus.  You have a stiff neck.  You have changes in your: ? Vision. ? Hearing. ? Thinking. ? Mood. This information is  not intended to replace advice given to you by your health care provider. Make sure you discuss any questions you have with your health care provider. Document Released: 09/05/2000 Document Revised: 11/13/2015 Document Reviewed: 06/17/2013 Elsevier Interactive Patient Education  [employer].

## 2017-06-21 NOTE — Assessment & Plan Note (Signed)
Stable, continue present medications.   

## 2017-06-21 NOTE — Assessment & Plan Note (Signed)
Continue with diet changes

## 2017-06-21 NOTE — Progress Notes (Signed)
BP 123/85   Pulse (!) 51   Temp 98.3 F (36.8 C) (Oral)   Ht 5' 6.5" (1.689 m)   Wt 233 lb 14.4 oz (106.1 kg)   LMP  (LMP Unknown)   SpO2 100%   BMI 37.19 kg/m     Subjective:    Patient ID: Gabriella Hensley, female    DOB: 10-16-1965, 52 y.o.   MRN: [id number]  HPI: Gabriella Hensley is a 53 y.o. female  Chief Complaint  Patient presents with  . Depression   Bipolar Doing well on current medication.  Feels sometimes she sleeps too much.  Sleeps about 9 hours.   Depression screen [facility] 2/9 06/21/2017 12/19/2016 06/12/2016 05/31/2015 12/01/2014  Decreased Interest 0 0 0 0 0  Down, Depressed, Hopeless 0 0 0 0 0  PHQ - 2 Score 0 0 0 0 0  Altered sleeping 0 0 - - -  Tired, decreased energy 0 0 - - -  Change in appetite 0 1 - - -  Feeling bad or failure about yourself  0 0 - - -  Trouble concentrating 0 0 - - -  Moving slowly or fidgety/restless 0 0 - - -  Suicidal thoughts 0 0 - - -  PHQ-9 Score 0 1 - - -   GERD Doing well by changing diet.  Looking to wean off of the Omeprazole.     URI    This is a new problem. The current episode started yesterday. The problem has been unchanged. There has been no fever. Associated symptoms include coughing, rhinorrhea and a sore throat. Pertinent negatives include no abdominal pain, chest pain, congestion, diarrhea, dysuria, ear pain, headaches, joint pain, joint swelling, nausea, neck pain, plugged ear sensation, rash, sinus pain, sneezing, swollen glands, vomiting or wheezing. Associated symptoms comments: Very scratchy throat. She has tried nothing for the symptoms.     Relevant past medical, surgical, family and social history reviewed and updated as indicated. Interim medical history since our last visit reviewed. Allergies and medications reviewed and updated.  Review of Systems  HENT: Positive for rhinorrhea and sore throat. Negative for congestion, ear pain, sinus pain and sneezing.   Respiratory: Positive for cough. Negative for wheezing.    Cardiovascular: Negative for chest pain.  Gastrointestinal: Negative for abdominal pain, diarrhea, nausea and vomiting.  Genitourinary: Negative for dysuria.  Musculoskeletal: Negative for joint pain and neck pain.  Skin: Negative for rash.  Neurological: Negative for headaches.    Per HPI unless specifically indicated above     Objective:    BP 123/85   Pulse (!) 51   Temp 98.3 F (36.8 C) (Oral)   Ht 5' 6.5" (1.689 m)   Wt 233 lb 14.4 oz (106.1 kg)   LMP  (LMP Unknown)   SpO2 100%   BMI 37.19 kg/m    Wt Readings from Last 3 Encounters:  06/21/17 233 lb 14.4 oz (106.1 kg)  12/19/16 242 lb 1.6 oz (109.8 kg)  06/12/16 244 lb 1.6 oz (110.7 kg)    Physical Exam  Constitutional: She is oriented to person, place, and time. She appears well-developed and well-nourished. No distress.  HENT:  Head: Normocephalic and atraumatic.  Right Ear: Tympanic membrane and ear canal normal.  Left Ear: Tympanic membrane and ear canal normal.  Nose: Rhinorrhea present. Right sinus exhibits no maxillary sinus tenderness and no frontal sinus tenderness. Left sinus exhibits no maxillary sinus tenderness and no frontal sinus tenderness.  Mouth/Throat: Mucous membranes  are normal. Posterior oropharyngeal erythema present.  Eyes: Conjunctivae and lids are normal. Right eye exhibits no discharge. Left eye exhibits no discharge. No scleral icterus.  Cardiovascular: Normal rate and regular rhythm.  Pulmonary/Chest: Effort normal and breath sounds normal. No respiratory distress.  Abdominal: Normal appearance. There is no splenomegaly or hepatomegaly.  Musculoskeletal: Normal range of motion.  Neurological: She is alert and oriented to person, place, and time.  Skin: Skin is intact. No rash noted. No pallor.  Psychiatric: She has a normal mood and affect. Her behavior is normal. Judgment and thought content normal.    Results for orders placed or performed in visit on 12/19/16  CBC with  Differential/Platelet  Result Value Ref Range   WBC 5.1 3.4 - 10.8 x10E3/uL   RBC 4.34 3.77 - 5.28 x10E6/uL   Hemoglobin 11.9 11.1 - 15.9 g/dL   Hematocrit 36.2 34.0 - 46.6 %   MCV 83 79 - 97 fL   MCH 27.4 26.6 - 33.0 pg   MCHC 32.9 31.5 - 35.7 g/dL   RDW 13.8 12.3 - 15.4 %   Platelets 248 150 - 379 x10E3/uL   Neutrophils 41 Not Estab. %   Lymphs 49 Not Estab. %   Monocytes 7 Not Estab. %   Eos 3 Not Estab. %   Basos 0 Not Estab. %   Neutrophils Absolute 2.1 1.4 - 7.0 x10E3/uL   Lymphocytes Absolute 2.5 0.7 - 3.1 x10E3/uL   Monocytes Absolute 0.4 0.1 - 0.9 x10E3/uL   EOS (ABSOLUTE) 0.1 0.0 - 0.4 x10E3/uL   Basophils Absolute 0.0 0.0 - 0.2 x10E3/uL   Immature Granulocytes 0 Not Estab. %   Immature Grans (Abs) 0.0 0.0 - 0.1 x10E3/uL  Comprehensive metabolic panel  Result Value Ref Range   Glucose 79 65 - 99 mg/dL   BUN 10 6 - 24 mg/dL   Creatinine, Ser 0.81 0.57 - 1.00 mg/dL   GFR calc non Af Amer 85 >59 mL/min/1.73   GFR calc Af Amer 98 >59 mL/min/1.73   BUN/Creatinine Ratio 12 9 - 23   Sodium 139 134 - 144 mmol/L   Potassium 3.9 3.5 - 5.2 mmol/L   Chloride 100 96 - 106 mmol/L   CO2 26 20 - 29 mmol/L   Calcium 9.7 8.7 - 10.2 mg/dL   Total Protein 7.3 6.0 - 8.5 g/dL   Albumin 4.4 3.5 - 5.5 g/dL   Globulin, Total 2.9 1.5 - 4.5 g/dL   Albumin/Globulin Ratio 1.5 1.2 - 2.2   Bilirubin Total 0.7 0.0 - 1.2 mg/dL   Alkaline Phosphatase 113 39 - 117 IU/L   AST 19 0 - 40 IU/L   ALT 18 0 - 32 IU/L  Lipid Panel w/o Chol/HDL Ratio  Result Value Ref Range   Cholesterol, Total 203 (H) 100 - 199 mg/dL   Triglycerides 88 0 - 149 mg/dL   HDL 48 >39 mg/dL   VLDL Cholesterol Cal 18 5 - 40 mg/dL   LDL Calculated 137 (H) 0 - 99 mg/dL  TSH  Result Value Ref Range   TSH 1.500 0.450 - 4.500 uIU/mL      Assessment & Plan:   Problem List Items Addressed This Visit      Unprioritized   Bipolar 2 disorder ([facility])    Stable, continue present medications.        GERD (gastroesophageal  reflux disease)    Continue with diet changes       Other Visit Diagnoses    Sore throat    -  Primary   Suspect related to early stages URI.  Pt ed on care.  Salt water gargles, throat lozenges.     Relevant Orders   Rapid Strep Screen (Not at [facility])       Follow up plan: Return in about 6 months (around 12/22/2017) for physical.

## 2017-06-24 LAB — RAPID STREP SCREEN (MED CTR MEBANE ONLY): STREP GP A AG, IA W/REFLEX: NEGATIVE

## 2017-06-24 LAB — CULTURE, GROUP A STREP: Strep A Culture: NEGATIVE

## 2017-06-26 ENCOUNTER — Other Ambulatory Visit: Payer: Self-pay | Admitting: Unknown Physician Specialty

## 2017-06-27 NOTE — Telephone Encounter (Signed)
LOV  06/21/17 [place]

## 2017-09-02 ENCOUNTER — Ambulatory Visit: Payer: BLUE CROSS/BLUE SHIELD | Admitting: Physician Assistant

## 2017-09-02 ENCOUNTER — Encounter: Payer: Self-pay | Admitting: Physician Assistant

## 2017-09-02 VITALS — BP 107/71 | HR 57 | Temp 98.3°F | Ht 66.0 in | Wt 222.4 lb

## 2017-09-02 DIAGNOSIS — R591 Generalized enlarged lymph nodes: Secondary | ICD-10-CM

## 2017-09-02 NOTE — Progress Notes (Signed)
   Subjective:    Patient ID: Gabriella Hensley, female    DOB: 06-May-1965, 52 y.o.   MRN: [id number]  Gabriella Hensley is a 52 y.o. female presenting on 09/02/2017 for Knot (Under right side of chin, ongoing 4 days.)   HPI   Gabriella Hensley is a 52 y/o woman presenting for right sided neck lump ongoing for five days. She noticed it at work the other day, notices some tenderness. Denies fever, chills, nausea, vomiting, sore throat, ear pain. She denies unintentional weight loss or night sweats. She denies smoking ever.   Lab Results  Component Value Date   WBC 5.1 12/19/2016   HGB 11.9 12/19/2016   HCT 36.2 12/19/2016   MCV 83 12/19/2016   PLT 248 12/19/2016     Social History   Tobacco Use  . Smoking status: Never Smoker  . Smokeless tobacco: Never Used  Substance Use Topics  . Alcohol use: No  . Drug use: No    Review of Systems Per HPI unless specifically indicated above     Objective:    BP 107/71 (BP Location: Left Arm, Patient Position: Sitting, Cuff Size: Normal)   Pulse (!) 57   Temp 98.3 F (36.8 C) (Oral)   Ht 5\' 6"  (1.676 m)   Wt 222 lb 6.4 oz (100.9 kg)   LMP  (LMP Unknown)   SpO2 97%   BMI 35.90 kg/m   Wt Readings from Last 3 Encounters:  09/02/17 222 lb 6.4 oz (100.9 kg)  06/21/17 233 lb 14.4 oz (106.1 kg)  12/19/16 242 lb 1.6 oz (109.8 kg)    Physical Exam  Constitutional: She is oriented to person, place, and time. She appears well-developed and well-nourished.  HENT:  Right Ear: External ear normal.  Left Ear: External ear normal.  Nose: Nose normal.  Mouth/Throat: Oropharynx is clear and moist and mucous membranes are normal. No oral lesions. No oropharyngeal exudate, posterior oropharyngeal edema, posterior oropharyngeal erythema or tonsillar abscesses. No tonsillar exudate.  Neck:  There is one enlarged right sided cervical lymph node that is approximately 0.5 cm, mobile, and tender to palpation.   Cardiovascular: Normal rate and regular rhythm.    Pulmonary/Chest: Effort normal and breath sounds normal.  Lymphadenopathy:    She has cervical adenopathy.  Neurological: She is alert and oriented to person, place, and time.  Skin: Skin is warm and dry.  Psychiatric: She has a normal mood and affect. Her behavior is normal.   Results for orders placed or performed in visit on 06/21/17  Rapid Strep Screen (Not at [facility])  Result Value Ref Range   Strep Gp A Ag, IA W/Reflex Negative Negative  Culture, Group A Strep  Result Value Ref Range   Strep A Culture Negative       Assessment & Plan:  1. Lymphadenopathy  Counseled on generally benign course of cervical lymphadenopathy, usually due to self limiting viral illness. She does not have any B symptoms and so advised watchful waiting over the next few weeks, anti-inflammatories for pain. If not resolved at that point, can consider empiric abx and follow up ultrasound.     Follow up plan: Return if symptoms worsen or fail to improve.  Carles Collet, PA-C [facility] 09/03/2017, 1:00 PM

## 2017-09-02 NOTE — Patient Instructions (Signed)
Lymphadenopathy Lymphadenopathy refers to swollen or enlarged lymph glands, also called lymph nodes. Lymph glands are part of your body's defense (immune) system, which protects the body from infections, germs, and diseases. Lymph glands are found in many locations in your body, including the neck, underarm, and groin. Many things can cause lymph glands to become enlarged. When your immune system responds to germs, such as viruses or bacteria, infection-fighting cells and fluid build up. This causes the glands to grow in size. Usually, this is not something to worry about. The swelling and any soreness often go away without treatment. However, swollen lymph glands can also be caused by a number of diseases. Your health care provider may do various tests to help determine the cause. If the cause of your swollen lymph glands cannot be found, it is important to monitor your condition to make sure the swelling goes away. Follow these instructions at home: Watch your condition for any changes. The following actions may help to lessen any discomfort you are feeling:  Get plenty of rest.  Take medicines only as directed by your health care provider. Your health care provider may recommend over-the-counter medicines for pain.  Apply moist heat compresses to the site of swollen lymph nodes as directed by your health care provider. This can help reduce any pain.  Check your lymph nodes daily for any changes.  Keep all follow-up visits as directed by your health care provider. This is important.  Contact a health care provider if:  Your lymph nodes are still swollen after 2 weeks.  Your swelling increases or spreads to other areas.  Your lymph nodes are hard, seem fixed to the skin, or are growing rapidly.  Your skin over the lymph nodes is red and inflamed.  You have a fever.  You have chills.  You have fatigue.  You develop a sore throat.  You have abdominal pain.  You have weight  loss.  You have night sweats. Get help right away if:  You notice fluid leaking from the area of the enlarged lymph node.  You have severe pain in any area of your body.  You have chest pain.  You have shortness of breath. This information is not intended to replace advice given to you by your health care provider. Make sure you discuss any questions you have with your health care provider. Document Released: 12/20/2007 Document Revised: 08/18/2015 Document Reviewed: 10/15/2013 Elsevier Interactive Patient Education  2018 [employer].  

## 2017-09-27 ENCOUNTER — Other Ambulatory Visit: Payer: Self-pay | Admitting: Unknown Physician Specialty

## 2017-09-27 DIAGNOSIS — Z1231 Encounter for screening mammogram for malignant neoplasm of breast: Secondary | ICD-10-CM

## 2017-10-24 ENCOUNTER — Ambulatory Visit
Admission: RE | Admit: 2017-10-24 | Discharge: 2017-10-24 | Disposition: A | Discharge status: Home / Self Care | Payer: BLUE CROSS/BLUE SHIELD | Source: Ambulatory Visit | Attending: Unknown Physician Specialty | Admitting: Unknown Physician Specialty

## 2017-10-24 DIAGNOSIS — Z1231 Encounter for screening mammogram for malignant neoplasm of breast: Secondary | ICD-10-CM | POA: Insufficient documentation

## 2017-11-05 DIAGNOSIS — H524 Presbyopia: Secondary | ICD-10-CM | POA: Diagnosis not present

## 2017-12-23 ENCOUNTER — Encounter: Payer: BLUE CROSS/BLUE SHIELD | Admitting: Unknown Physician Specialty

## 2017-12-30 ENCOUNTER — Encounter: Payer: Self-pay | Admitting: Nurse Practitioner

## 2017-12-30 ENCOUNTER — Ambulatory Visit (INDEPENDENT_AMBULATORY_CARE_PROVIDER_SITE_OTHER): Payer: BLUE CROSS/BLUE SHIELD | Admitting: Nurse Practitioner

## 2017-12-30 VITALS — BP 120/83 | HR 57 | Temp 98.3°F | Ht 66.0 in | Wt 217.2 lb

## 2017-12-30 DIAGNOSIS — K219 Gastro-esophageal reflux disease without esophagitis: Secondary | ICD-10-CM

## 2017-12-30 DIAGNOSIS — E7849 Other hyperlipidemia: Secondary | ICD-10-CM | POA: Diagnosis not present

## 2017-12-30 DIAGNOSIS — Z23 Encounter for immunization: Secondary | ICD-10-CM

## 2017-12-30 DIAGNOSIS — Z0000 Encounter for general adult medical examination without abnormal findings: Secondary | ICD-10-CM

## 2017-12-30 DIAGNOSIS — F3181 Bipolar II disorder: Secondary | ICD-10-CM | POA: Diagnosis not present

## 2017-12-30 DIAGNOSIS — R739 Hyperglycemia, unspecified: Secondary | ICD-10-CM | POA: Diagnosis not present

## 2017-12-30 MED ORDER — RISPERIDONE 3 MG PO TABS: 3.0000 mg | ORAL_TABLET | Freq: Every day | ORAL | 3 refills | Status: DC

## 2017-12-30 MED ORDER — BUPROPION HCL 100 MG PO TABS: 100.0000 mg | ORAL_TABLET | Freq: Every day | ORAL | 6 refills | Status: DC

## 2017-12-30 NOTE — Assessment & Plan Note (Signed)
LDL 137 on labs in September 2018.  Repeat lipid panel today and if continued elevation with elevated ASCVD score then discuss with patient initiation of statin during next visit.  Continue heart healthy diet at home.

## 2017-12-30 NOTE — Assessment & Plan Note (Signed)
Stable mood, having days where she feels more "fuzzy" and would like to try coming down Bupropion.  Will decrease dose to 100MG  and reassess in 6 months or sooner if increased mood issues.  Continue Risperidone, as has been on long term with good affect.

## 2017-12-30 NOTE — Patient Instructions (Addendum)
Tdap Vaccine (Tetanus, Diphtheria and Pertussis): What You Need to Know 1. Why get vaccinated? Tetanus, diphtheria and pertussis are very serious diseases. Tdap vaccine can protect us from these diseases. And, Tdap vaccine given to pregnant women can protect newborn babies against pertussis. TETANUS (Lockjaw) is rare in the [place] today. It causes painful muscle tightening and stiffness, usually all over the body.  It can lead to tightening of muscles in the head and neck so you can't open your mouth, swallow, or sometimes even breathe. Tetanus kills about 1 out of 10 people who are infected even after receiving the best medical care.  DIPHTHERIA is also rare in the [place] today. It can cause a thick coating to form in the back of the throat.  It can lead to breathing problems, heart failure, paralysis, and death.  PERTUSSIS (Whooping Cough) causes severe coughing spells, which can cause difficulty breathing, vomiting and disturbed sleep.  It can also lead to weight loss, incontinence, and rib fractures. Up to 2 in 100 adolescents and 5 in 100 adults with pertussis are hospitalized or have complications, which could include pneumonia or death.  These diseases are caused by bacteria. Diphtheria and pertussis are spread from person to person through secretions from coughing or sneezing. Tetanus enters the body through cuts, scratches, or wounds. Before vaccines, as many as 200,000 cases of diphtheria, 200,000 cases of pertussis, and hundreds of cases of tetanus, were reported in the [place] each year. Since vaccination began, reports of cases for tetanus and diphtheria have dropped by about 99% and for pertussis by about 80%. 2. Tdap vaccine Tdap vaccine can protect adolescents and adults from tetanus, diphtheria, and pertussis. One dose of Tdap is routinely given at age 11 or 12. People who did not get Tdap at that age should get it as soon as possible. Tdap is especially  important for healthcare professionals and anyone having close contact with a baby younger than 12 months. Pregnant women should get a dose of Tdap during every pregnancy, to protect the newborn from pertussis. Infants are most at risk for severe, life-threatening complications from pertussis. Another vaccine, called Td, protects against tetanus and diphtheria, but not pertussis. A Td booster should be given every 10 years. Tdap may be given as one of these boosters if you have never gotten Tdap before. Tdap may also be given after a severe cut or burn to prevent tetanus infection. Your doctor or the person giving you the vaccine can give you more information. Tdap may safely be given at the same time as other vaccines. 3. Some people should not get this vaccine  A person who has ever had a life-threatening allergic reaction after a previous dose of any diphtheria, tetanus or pertussis containing vaccine, OR has a severe allergy to any part of this vaccine, should not get Tdap vaccine. Tell the person giving the vaccine about any severe allergies.  Anyone who had coma or long repeated seizures within 7 days after a childhood dose of DTP or DTaP, or a previous dose of Tdap, should not get Tdap, unless a cause other than the vaccine was found. They can still get Td.  Talk to your doctor if you: ? have seizures or another nervous system problem, ? had severe pain or swelling after any vaccine containing diphtheria, tetanus or pertussis, ? ever had a condition called Guillain-Barr Syndrome (GBS), ? aren't feeling well on the day the shot is scheduled. 4. Risks With any medicine, including   vaccines, there is a chance of side effects. These are usually mild and go away on their own. Serious reactions are also possible but are rare. Most people who get Tdap vaccine do not have any problems with it. Mild problems following Tdap: (Did not interfere with activities)  Pain where the shot was given (about  3 in 4 adolescents or 2 in 3 adults)  Redness or swelling where the shot was given (about 1 person in 5)  Mild fever of at least 100.4F (up to about 1 in 25 adolescents or 1 in 100 adults)  Headache (about 3 or 4 people in 10)  Tiredness (about 1 person in 3 or 4)  Nausea, vomiting, diarrhea, stomach ache (up to 1 in 4 adolescents or 1 in 10 adults)  Chills, sore joints (about 1 person in 10)  Body aches (about 1 person in 3 or 4)  Rash, swollen glands (uncommon)  Moderate problems following Tdap: (Interfered with activities, but did not require medical attention)  Pain where the shot was given (up to 1 in 5 or 6)  Redness or swelling where the shot was given (up to about 1 in 16 adolescents or 1 in 12 adults)  Fever over 102F (about 1 in 100 adolescents or 1 in 250 adults)  Headache (about 1 in 7 adolescents or 1 in 10 adults)  Nausea, vomiting, diarrhea, stomach ache (up to 1 or 3 people in 100)  Swelling of the entire arm where the shot was given (up to about 1 in 500).  Severe problems following Tdap: (Unable to perform usual activities; required medical attention)  Swelling, severe pain, bleeding and redness in the arm where the shot was given (rare).  Problems that could happen after any vaccine:  People sometimes faint after a medical procedure, including vaccination. Sitting or lying down for about 15 minutes can help prevent fainting, and injuries caused by a fall. Tell your doctor if you feel dizzy, or have vision changes or ringing in the ears.  Some people get severe pain in the shoulder and have difficulty moving the arm where a shot was given. This happens very rarely.  Any medication can cause a severe allergic reaction. Such reactions from a vaccine are very rare, estimated at fewer than 1 in a million doses, and would happen within a few minutes to a few hours after the vaccination. As with any medicine, there is a very remote chance of a vaccine  causing a serious injury or death. The safety of vaccines is always being monitored. For more information, visit: www.cdc.gov/vaccinesafety/ 5. What if there is a serious problem? What should I look for? Look for anything that concerns you, such as signs of a severe allergic reaction, very high fever, or unusual behavior. Signs of a severe allergic reaction can include hives, swelling of the face and throat, difficulty breathing, a fast heartbeat, dizziness, and weakness. These would usually start a few minutes to a few hours after the vaccination. What should I do?  If you think it is a severe allergic reaction or other emergency that can't wait, call 9-1-1 or get the person to the nearest hospital. Otherwise, call your doctor.  Afterward, the reaction should be reported to the Vaccine Adverse Event Reporting System (VAERS). Your doctor might file this report, or you can do it yourself through the VAERS web site at www.vaers.hhs.gov, or by calling 1-800-822-7967. ? VAERS does not give medical advice. 6. The National Vaccine Injury Compensation Program The National   Vaccine Injury Compensation Program (VICP) is a federal program that was created to compensate people who may have been injured by certain vaccines. Persons who believe they may have been injured by a vaccine can learn about the program and about filing a claim by calling 928-528-8329 or visiting the [place] website at GoldCloset.com.ee. There is a time limit to file a claim for compensation. 7. How can I learn more?  Ask your doctor. He or she can give you the vaccine package insert or suggest other sources of information.  Call your local or state health department.  Contact the Centers for Disease Control and Prevention (CDC): ? Call 573-379-8755 (1-800-CDC-INFO) or ? Visit CDC's website at http://hunter.com/ CDC Tdap Vaccine VIS (05/19/13) This information is not intended to replace advice given to you by your  health care provider. Make sure you discuss any questions you have with your health care provider. Document Released: 09/11/2011 Document Revised: 12/01/2015 Document Reviewed: 12/01/2015 Elsevier Interactive Patient Education  2017 [place].  Bipolar 1 Disorder Bipolar 1 disorder is a mental health disorder in which a person has episodes of emotional highs (mania), and may also have episodes of emotional lows (depression) in addition to highs. Bipolar 1 disorder is different from other bipolar disorders because it involves extreme manic episodes. These episodes last at least one week or involve symptoms that are so severe that hospitalization is needed to keep the person safe. What increases the risk? The cause of this condition is not known. However, certain factors make you more likely to have bipolar disorder, such as:  Having a family member with the disorder.  An imbalance of certain chemicals in the brain (neurotransmitters).  Stress, such as illness, financial problems, or a death.  Certain conditions that affect the brain or spinal cord (neurologic conditions).  Brain injury (trauma).  Having another mental health disorder, such as: ? Obsessive compulsive disorder. ? Schizophrenia.  What are the signs or symptoms? Symptoms of mania include:  Very high self-esteem or self-confidence.  Decreased need for sleep.  Unusual talkativeness or feeling a need to keep talking. Speech may be very fast. It may seem like you cannot stop talking.  Racing thoughts or constant talking, with quick shifts between topics that may or may not be related (flight of ideas).  Decreased ability to focus or concentrate.  Increased purposeful activity, such as work, studies, or social activity.  Increased nonproductive activity. This could be pacing, squirming and fidgeting, or finger and toe tapping.  Impulsive behavior and poor judgment. This may result in high-risk activities, such as  having unprotected sex or spending a lot of money.  Symptoms of depression include:  Feeling sad, hopeless, or helpless.  Frequent or uncontrollable crying.  Lack of feeling or caring about anything.  Sleeping too much.  Moving more slowly than usual.  Not being able to enjoy things you used to enjoy.  Wanting to be alone all the time.  Feeling guilty or worthless.  Lack of energy or motivation.  Trouble concentrating or remembering.  Trouble making decisions.  Increased appetite.  Thoughts of death, or the desire to harm yourself.  Sometimes, you may have a mixed mood. This means having symptoms of depression and mania. Stress can make symptoms worse. How is this diagnosed? To diagnose bipolar disorder, your health care provider may ask about your:  Emotional episodes.  Medical history.  Alcohol and drug use. This includes prescription medicines. Certain medical conditions and substances can cause symptoms that seem  like bipolar disorder (secondary bipolar disorder).  How is this treated? Bipolar disorder is a long-term (chronic) illness. It is best controlled with ongoing (continuous) treatment rather than treatment only when symptoms occur. Treatment may include:  Medicine. Medicine can be prescribed by a provider who specializes in treating mental disorders (psychiatrist). ? Medicines called mood stabilizers are usually prescribed. ? If symptoms occur even while taking a mood stabilizer, other medicines may be added.  Psychotherapy. Some forms of talk therapy, such as cognitive-behavioral therapy (CBT), can provide support, education, and guidance.  Coping methods, such as journaling or relaxation exercises. These may include: ? Yoga. ? Meditation. ? Deep breathing.  Lifestyle changes, such as: ? Limiting alcohol and drug use. ? Exercising regularly. ? Getting plenty of sleep. ? Making healthy eating choices.  A combination of medicine, talk therapy,  and coping methods is best. A procedure in which electricity is applied to the brain through the scalp (electroconvulsive therapy) may be used in cases of severe mania when medicine and psychotherapy work too slowly or do not work. Follow these instructions at home: Activity   Return to your normal activities as told by your health care provider.  Find activities that you enjoy, and make time to do them.  Exercise regularly as told by your health care provider. Lifestyle  Limit alcohol intake to no more than 1 drink a day for nonpregnant women and 2 drinks a day for men. One drink equals 12 oz of beer, 5 oz of wine, or 1 oz of hard liquor.  Follow a set schedule for eating and sleeping.  Eat a balanced diet that includes fresh fruits and vegetables, whole grains, low-fat dairy, and lean meat.  Get 7-8 hours of sleep each night. General instructions  Take over-the-counter and prescription medicines only as told by your health care provider.  Think about joining a support group. Your health care provider may be able to recommend a support group.  Talk with your family and loved ones about your treatment goals and how they can help.  Keep all follow-up visits as told by your health care provider. This is important. Where to find more information: For more information about bipolar disorder, visit the following websites:  [employer] on Mental Illness: www.nami.[place]: https://carter.com/  Contact a health care provider if:  Your symptoms get worse.  You have side effects from your medicine, and they get worse.  You have trouble sleeping.  You have trouble doing daily activities.  You feel unsafe in your surroundings.  You are dealing with substance abuse. Get help right away if:  You have new symptoms.  You have thoughts about harming yourself.  You self-harm. This information is not intended to replace advice given to you  by your health care provider. Make sure you discuss any questions you have with your health care provider. Document Released: 06/18/2000 Document Revised: 11/06/2015 Document Reviewed: 11/10/2015 Elsevier Interactive Patient Education  [employer].

## 2017-12-30 NOTE — Progress Notes (Signed)
BP 120/83   Pulse (!) 57   Temp 98.3 F (36.8 C) (Oral)   Ht 5' 6"  (1.676 m)   Wt 217 lb 3.2 oz (98.5 kg)   LMP  (LMP Unknown)   SpO2 98%   BMI 35.06 kg/m    Subjective:    Patient ID: Gabriella Hensley, female    DOB: 1965/12/29, 52 y.o.   MRN: [id number]  HPI: Gabriella Hensley is a 52 y.o. female present for annual physical exam.  Chief Complaint  Patient presents with  . Annual Exam   Bipolar 2 Disorder: Is chronic, ongoing.  She has been on Risperidone for 14 years and states that this assists her mood "very well".  Reports noting increased "fuzziness" with Bupropion 150MG dose and requests attempting a trial reduction.  Discussed decreasing to 100MG, which she agrees with.  She is aware to return to clinic or call if any increase in mood issues.  Reports overall her mood has remained stable.  Denies depressed mood, sleep disturbance, energy loss, appetite changes, agitation, or SI/HI.  Denies any cycling of mood pattern.  States she has had bipolar for several years and feels she is "best I have been".    GERD: Is chronic, stable without medication at this time.  Denies heart burn, N&V, or chest discomfort.  Reports eating "fairly" healthy diet.  Has noted greasy food makes heart burn worse if present.    Hyperlipidemia: Is chronic, ongoing.  Last lipid panel in September 2018.  No current statin use.  Denies CP, SOB, headaches.  Reports she has been attempting heart healthy diet at home.     Relevant past medical, surgical, family and social history reviewed and updated as indicated. Interim medical history since our last visit reviewed. Allergies and medications reviewed and updated.  Review of Systems  Constitutional: Negative for activity change, fatigue and fever.  HENT: Negative.   Eyes: Negative.   Respiratory: Negative for apnea, cough, chest tightness and shortness of breath.   Cardiovascular: Negative for chest pain, palpitations and leg swelling.  Gastrointestinal:  Negative for abdominal distention, abdominal pain, constipation and nausea.  Endocrine: Negative.   Genitourinary: Negative for difficulty urinating, frequency and urgency.  Musculoskeletal: Negative for back pain, joint swelling and myalgias.  Skin: Negative.   Neurological: Negative for dizziness, numbness and headaches.  Psychiatric/Behavioral: Negative for behavioral problems, self-injury, sleep disturbance and suicidal ideas.    Per HPI unless specifically indicated above     Objective:    BP 120/83   Pulse (!) 57   Temp 98.3 F (36.8 C) (Oral)   Ht 5' 6"  (1.676 m)   Wt 217 lb 3.2 oz (98.5 kg)   LMP  (LMP Unknown)   SpO2 98%   BMI 35.06 kg/m   Wt Readings from Last 3 Encounters:  12/30/17 217 lb 3.2 oz (98.5 kg)  09/02/17 222 lb 6.4 oz (100.9 kg)  06/21/17 233 lb 14.4 oz (106.1 kg)    Physical Exam  Constitutional: She is oriented to person, place, and time. She appears well-developed and well-nourished.  HENT:  Head: Normocephalic and atraumatic.  Right Ear: External ear normal.  Left Ear: External ear normal.  Nose: Nose normal.  Mouth/Throat: Oropharynx is clear and moist.  Eyes: Pupils are equal, round, and reactive to light. EOM are normal.  Fields of vision intact  Neck: Normal range of motion. Neck supple.  Cardiovascular: Normal rate, regular rhythm, normal heart sounds and intact distal pulses.  Pulmonary/Chest:  Effort normal and breath sounds normal.  Abdominal: Soft. Bowel sounds are normal.  Genitourinary:  Genitourinary Comments: H/o hysterectomy  Musculoskeletal: Normal range of motion.  Neurological: She is alert and oriented to person, place, and time.  Sensation intact  Skin: Skin is warm and dry.  Psychiatric: She has a normal mood and affect. Her behavior is normal.     Results for orders placed or performed in visit on 06/21/17  Rapid Strep Screen (Not at [facility])  Result Value Ref Range   Strep Gp A Ag, IA W/Reflex Negative Negative    Culture, Group A Strep  Result Value Ref Range   Strep A Culture Negative       Assessment & Plan:   Problem List Items Addressed This Visit      Digestive   GERD (gastroesophageal reflux disease)    Continue with current diet at home.  Monitor for worsening symptoms and address as indicated.        Other   Hyperlipidemia    LDL 137 on labs in September 2018.  Repeat lipid panel today and if continued elevation with elevated ASCVD score then discuss with patient initiation of statin during next visit.  Continue heart healthy diet at home.      Bipolar 2 disorder (HCC)    Stable mood, having days where she feels more "fuzzy" and would like to try coming down Bupropion.  Will decrease dose to 100MG and reassess in 6 months or sooner if increased mood issues.  Continue Risperidone, as has been on long term with good affect.      Relevant Medications   buPROPion (WELLBUTRIN) 100 MG tablet    Other Visit Diagnoses    Need for Tdap vaccination    -  Primary   Relevant Orders   Tdap vaccine greater than or equal to 7yo IM (Completed)   Annual physical exam       Obtain annual labs during visit today, and administer TDAP.  Received flu shot at work today.   Relevant Orders   Comp Met (CMET)   Lipid Profile   TSH   HgB A1c   CBC       Follow up plan: Return in about 6 months (around 07/01/2018), or if symptoms worsen or fail to improve.

## 2017-12-30 NOTE — Assessment & Plan Note (Signed)
Continue with current diet at home.  Monitor for worsening symptoms and address as indicated. °

## 2017-12-31 LAB — COMPREHENSIVE METABOLIC PANEL
ALK PHOS: 102 IU/L (ref 39–117)
ALT: 13 IU/L (ref 0–32)
AST: 12 IU/L (ref 0–40)
Albumin/Globulin Ratio: 1.6 (ref 1.2–2.2)
Albumin: 4.4 g/dL (ref 3.5–5.5)
BUN/Creatinine Ratio: 15 (ref 9–23)
BUN: 13 mg/dL (ref 6–24)
Bilirubin Total: 0.6 mg/dL (ref 0.0–1.2)
CALCIUM: 10.1 mg/dL (ref 8.7–10.2)
CO2: 25 mmol/L (ref 20–29)
CREATININE: 0.84 mg/dL (ref 0.57–1.00)
Chloride: 100 mmol/L (ref 96–106)
GFR calc Af Amer: 93 mL/min/{1.73_m2} (ref >59)
GFR, EST NON AFRICAN AMERICAN: 81 mL/min/{1.73_m2} (ref >59)
GLUCOSE: 84 mg/dL (ref 65–99)
Globulin, Total: 2.8 g/dL (ref 1.5–4.5)
Potassium: 4.5 mmol/L (ref 3.5–5.2)
Sodium: 141 mmol/L (ref 134–144)
Total Protein: 7.2 g/dL (ref 6.0–8.5)

## 2017-12-31 LAB — CBC
HEMATOCRIT: 37.4 % (ref 34.0–46.6)
HEMOGLOBIN: 12.5 g/dL (ref 11.1–15.9)
MCH: 28.6 pg (ref 26.6–33.0)
MCHC: 33.4 g/dL (ref 31.5–35.7)
MCV: 86 fL (ref 79–97)
Platelets: 234 10*3/uL (ref 150–450)
RBC: 4.37 x10E6/uL (ref 3.77–5.28)
RDW: 12.4 % (ref 12.3–15.4)
WBC: 5.5 10*3/uL (ref 3.4–10.8)

## 2017-12-31 LAB — LIPID PANEL
CHOL/HDL RATIO: 4.8 ratio — AB (ref 0.0–4.4)
CHOLESTEROL TOTAL: 230 mg/dL — AB (ref 100–199)
HDL: 48 mg/dL (ref >39)
LDL Calculated: 157 mg/dL — ABNORMAL HIGH (ref 0–99)
TRIGLYCERIDES: 125 mg/dL (ref 0–149)
VLDL CHOLESTEROL CAL: 25 mg/dL (ref 5–40)

## 2017-12-31 LAB — HEMOGLOBIN A1C
Est. average glucose Bld gHb Est-mCnc: 123 mg/dL
Hgb A1c MFr Bld: 5.9 % — ABNORMAL HIGH (ref 4.8–5.6)

## 2017-12-31 LAB — TSH: TSH: 1.02 u[IU]/mL (ref 0.450–4.500)

## 2018-07-01 ENCOUNTER — Ambulatory Visit (INDEPENDENT_AMBULATORY_CARE_PROVIDER_SITE_OTHER): Payer: BLUE CROSS/BLUE SHIELD | Admitting: Nurse Practitioner

## 2018-07-01 ENCOUNTER — Encounter: Payer: Self-pay | Admitting: Nurse Practitioner

## 2018-07-01 ENCOUNTER — Other Ambulatory Visit: Payer: Self-pay

## 2018-07-01 DIAGNOSIS — K219 Gastro-esophageal reflux disease without esophagitis: Secondary | ICD-10-CM | POA: Diagnosis not present

## 2018-07-01 DIAGNOSIS — E7849 Other hyperlipidemia: Secondary | ICD-10-CM | POA: Diagnosis not present

## 2018-07-01 DIAGNOSIS — R7303 Prediabetes: Secondary | ICD-10-CM | POA: Insufficient documentation

## 2018-07-01 DIAGNOSIS — E7800 Pure hypercholesterolemia, unspecified: Secondary | ICD-10-CM | POA: Insufficient documentation

## 2018-07-01 DIAGNOSIS — G5701 Lesion of sciatic nerve, right lower limb: Secondary | ICD-10-CM

## 2018-07-01 DIAGNOSIS — F3181 Bipolar II disorder: Secondary | ICD-10-CM

## 2018-07-01 MED ORDER — BUPROPION HCL 100 MG PO TABS: 100.0000 mg | ORAL_TABLET | Freq: Every day | ORAL | 3 refills | Status: DC

## 2018-07-01 MED ORDER — PREDNISONE 10 MG PO TABS: ORAL_TABLET | ORAL | 0 refills | Status: DC

## 2018-07-01 NOTE — Progress Notes (Signed)
BP 127/84 (BP Location: Right Arm, Patient Position: Sitting, Cuff Size: Normal)   Ht 5' 7"  (1.702 m)   Wt 226 lb (102.5 kg)   LMP  (LMP Unknown)   BMI 35.40 kg/m    Subjective:    Patient ID: Gabriella Hensley, female    DOB: [birth year], 53 y.o.   MRN: [id number]  HPI: Gabriella Hensley is a 53 y.o. female  Chief Complaint  Patient presents with  . Manic Behavior  . Gastroesophageal Reflux  . Hyperlipidemia  . Hip Pain    Patient states that when she lays on her right side, she experience hip pain radiating to her buttox. She can't lay on her right side at all.     . This visit was completed via WebEx due to the restrictions of the COVID-19 pandemic. All issues as above were discussed and addressed. Physical exam was done as above through visual confirmation on WebEx. If it was felt that the patient should be evaluated in the office, they were directed there. The patient verbally consented to this visit. . Location of the patient: home . Location of the provider: home . Those involved with this call:  . Provider: Marnee Guarneri, DNP, AGPCNP-C . CMA: Merilyn Baba, [place] . Front Desk/Registration: Don Perking  . Time spent on call: 15 minutes with patient face to face via video conference. More than 50% of this time was spent in counseling and coordination of care.   BIPOLAR DISORDER: Chronic, ongoing.  Continues on Wellbutrin and Risperdal with good control.  Has been on Risperdal for 14 years.  At last visit she had increased "fuzziness" with Wellbutrin at 150 MG and dose was decreased to 100 MG at that time, October 2019.  She reports less "fuzziness" with 100 MG and continued good control of mood.  Denies SI/HI.  Depression screen [facility] 2/9 07/01/2018 12/30/2017 06/21/2017 12/19/2016 06/12/2016  Decreased Interest 0 0 0 0 0  Down, Depressed, Hopeless 0 0 0 0 0  PHQ - 2 Score 0 0 0 0 0  Altered sleeping 0 0 0 0 -  Tired, decreased energy 0 0 0 0 -  Change in appetite 0 0 0 1 -   Feeling bad or failure about yourself  0 0 0 0 -  Trouble concentrating 1 0 0 0 -  Moving slowly or fidgety/restless 0 0 0 0 -  Suicidal thoughts 0 0 0 0 -  PHQ-9 Score 1 0 0 1 -  Difficult doing work/chores Not difficult at all - - - -    GERD Continues to be controlled without medication. GERD control status: stable  Satisfied with current treatment? yes Heartburn frequency: none Medication side effects: no  Previous GERD medications: TUMS Antacid use frequency:  none Duration:  Nature:  Location:  Heartburn duration:  Alleviatiating factors:  monitors diet Aggravating factors: foods, she monitors diet Dysphagia: no Odynophagia:  no Hematemesis: no Blood in stool: no EGD: no   HYPERLIPIDEMIA No current medications.  LDL elevated on October labs, agrees with repeat with fasting labs in 3 months.  Discussed monitoring diet and low cholesterol diet at home. Aspirin:  no The 10-year ASCVD risk score Mikey Bussing DC Jr., et al., 2013) is: 3.1%   Values used to calculate the score:     Age: 89 years     Sex: Female     Is Non-Hispanic African American: Yes     Diabetic: No     Tobacco smoker: No  Systolic Blood Pressure: 333 mmHg     Is BP treated: No     HDL Cholesterol: 48 mg/dL     Total Cholesterol: 230 mg/dL Chest pain:  no Coronary artery disease:  no Family history CAD:  no Family history early CAD:  no   RIGHT HIP PAIN: Has been present for 2 years, fluctuates.  At this time it is getting worse.  Reports this as worse when she is lying down or sitting for prolonged periods.  Can not lie on her right side as the pain is worse with radiation, numbness to above her right knee.  States this improves with movement, no pain when moving.  Pain starts in mid-right buttock and then moves around to right hip and down to above right knee.  Reports having done physical therapy for it in past, but could not continue to afford this.   Has not taken any medication for this at home.  Duration: years Mechanism of injury: unknown Location: Right Onset: gradual Severity: 8/10 when lying down, does not hurt when she is walking or moving Quality: dull and aching Frequency: intermittent Radiation: R leg above the knee numbness Aggravating factors: laying Alleviating factors: none Status: fluctuating and worse Treatments attempted: none  Relief with NSAIDs?: No NSAIDs Taken Nighttime pain:  yes Paresthesias / decreased sensation:  yes Bowel / bladder incontinence:  no Fevers:  no Dysuria / urinary frequency:  no   Relevant past medical, surgical, family and social history reviewed and updated as indicated. Interim medical history since our last visit reviewed. Allergies and medications reviewed and updated.  Review of Systems  Constitutional: Negative for activity change, appetite change, diaphoresis, fatigue and fever.  Respiratory: Negative for cough, chest tightness and shortness of breath.   Cardiovascular: Negative for chest pain, palpitations and leg swelling.  Gastrointestinal: Negative for abdominal distention, abdominal pain, constipation, diarrhea, nausea and vomiting.  Endocrine: Negative for cold intolerance, heat intolerance, polydipsia, polyphagia and polyuria.  Musculoskeletal: Positive for arthralgias.  Neurological: Negative for dizziness, syncope, weakness, light-headedness, numbness and headaches.  Psychiatric/Behavioral: Negative.     Per HPI unless specifically indicated above     Objective:    BP 127/84 (BP Location: Right Arm, Patient Position: Sitting, Cuff Size: Normal)   Ht 5' 7"  (1.702 m)   Wt 226 lb (102.5 kg)   LMP  (LMP Unknown)   BMI 35.40 kg/m   Wt Readings from Last 3 Encounters:  07/01/18 226 lb (102.5 kg)  12/30/17 217 lb 3.2 oz (98.5 kg)  09/02/17 222 lb 6.4 oz (100.9 kg)    Physical Exam Vitals signs and nursing note reviewed.  Constitutional:      General: She is awake.     Appearance: Normal appearance. She is  well-developed. She is obese.  HENT:     Head: Normocephalic.     Right Ear: Hearing normal.     Left Ear: Hearing normal.  Eyes:     General: Lids are normal.  Neck:     Musculoskeletal: Normal range of motion.  Cardiovascular:     Comments: Unable to auscultate due to virtual visit only. Pulmonary:     Effort: Pulmonary effort is normal. No accessory muscle usage or respiratory distress.     Comments: Unable to auscultate due to virtual visit only. Abdominal:     Palpations: Abdomen is soft.     Comments: Reports soft abdomen with no tenderness on self palpation.  Musculoskeletal:     Right hip: She exhibits  tenderness. She exhibits normal range of motion, no swelling and no laceration.     Left hip: She exhibits normal range of motion, no tenderness, no swelling and no laceration.     Comments: Assessed via video.  Full ROM present both hips.  Tenderness noted to right mid-buttock with increase in tenderness noted with flexion with bent knee.  No pain with ROM of right hip.    Neurological:     Mental Status: She is alert and oriented to person, place, and time.  Psychiatric:        Mood and Affect: Mood normal.        Behavior: Behavior normal. Behavior is cooperative.        Thought Content: Thought content normal.        Judgment: Judgment normal.     Results for orders placed or performed in visit on 12/30/17  Comp Met (CMET)  Result Value Ref Range   Glucose 84 65 - 99 mg/dL   BUN 13 6 - 24 mg/dL   Creatinine, Ser 0.84 0.57 - 1.00 mg/dL   GFR calc non Af Amer 81 >59 mL/min/1.73   GFR calc Af Amer 93 >59 mL/min/1.73   BUN/Creatinine Ratio 15 9 - 23   Sodium 141 134 - 144 mmol/L   Potassium 4.5 3.5 - 5.2 mmol/L   Chloride 100 96 - 106 mmol/L   CO2 25 20 - 29 mmol/L   Calcium 10.1 8.7 - 10.2 mg/dL   Total Protein 7.2 6.0 - 8.5 g/dL   Albumin 4.4 3.5 - 5.5 g/dL   Globulin, Total 2.8 1.5 - 4.5 g/dL   Albumin/Globulin Ratio 1.6 1.2 - 2.2   Bilirubin Total 0.6 0.0 -  1.2 mg/dL   Alkaline Phosphatase 102 39 - 117 IU/L   AST 12 0 - 40 IU/L   ALT 13 0 - 32 IU/L  Lipid Profile  Result Value Ref Range   Cholesterol, Total 230 (H) 100 - 199 mg/dL   Triglycerides 125 0 - 149 mg/dL   HDL 48 >39 mg/dL   VLDL Cholesterol Cal 25 5 - 40 mg/dL   LDL Calculated 157 (H) 0 - 99 mg/dL   Chol/HDL Ratio 4.8 (H) 0.0 - 4.4 ratio  TSH  Result Value Ref Range   TSH 1.020 0.450 - 4.500 uIU/mL  HgB A1c  Result Value Ref Range   Hgb A1c MFr Bld 5.9 (H) 4.8 - 5.6 %   Est. average glucose Bld gHb Est-mCnc 123 mg/dL  CBC  Result Value Ref Range   WBC 5.5 3.4 - 10.8 x10E3/uL   RBC 4.37 3.77 - 5.28 x10E6/uL   Hemoglobin 12.5 11.1 - 15.9 g/dL   Hematocrit 37.4 34.0 - 46.6 %   MCV 86 79 - 97 fL   MCH 28.6 26.6 - 33.0 pg   MCHC 33.4 31.5 - 35.7 g/dL   RDW 12.4 12.3 - 15.4 %   Platelets 234 150 - 450 x10E3/uL      Assessment & Plan:   Problem List Items Addressed This Visit      Digestive   GERD (gastroesophageal reflux disease)    Chronic, stable without medication.  Continue diet control.        Nervous and Auditory   Piriformis syndrome of right side    Ongoing with fluctuations.  Prednisone taper script sent.  Recommend minimal use of Ibuprofen as needed only and not in conjunction with Prednisone.  Recommend application of heat as needed.  Discussed researching  various stretches online which can be utilized to help discomfort.  Return for worsening or continued symptoms.  If ongoing obtain imaging of right hip and consider ortho or PT referral.        Relevant Medications   buPROPion (WELLBUTRIN) 100 MG tablet     Other   Hyperlipidemia    Repeat lipid panel in 3 months and continue focus on diet at home.      Bipolar 2 disorder (HCC)    Chronic, ongoing.  Continue current medication regimen.  Refills sent.      Relevant Medications   buPROPion (WELLBUTRIN) 100 MG tablet      I discussed the assessment and treatment plan with the patient. The  patient was provided an opportunity to ask questions and all were answered. The patient agreed with the plan and demonstrated an understanding of the instructions.   The patient was advised to call back or seek an in-person evaluation if the symptoms worsen or if the condition fails to improve as anticipated.   I provided 15 minutes of time during this encounter.  Follow up plan: Return in about 3 months (around 09/30/2018) for HLD and mood.

## 2018-07-01 NOTE — Assessment & Plan Note (Signed)
Chronic, stable without medication.  Continue diet control.

## 2018-07-01 NOTE — Assessment & Plan Note (Signed)
Ongoing with fluctuations.  Prednisone taper script sent.  Recommend minimal use of Ibuprofen as needed only and not in conjunction with Prednisone.  Recommend application of heat as needed.  Discussed researching various stretches online which can be utilized to help discomfort.  Return for worsening or continued symptoms.  If ongoing obtain imaging of right hip and consider ortho or PT referral.

## 2018-07-01 NOTE — Assessment & Plan Note (Signed)
Repeat lipid panel in 3 months and continue focus on diet at home.

## 2018-07-01 NOTE — Assessment & Plan Note (Signed)
Repeat lipid panel in 3 months

## 2018-07-01 NOTE — Patient Instructions (Signed)
Piriformis Syndrome    Piriformis syndrome is a condition that can cause pain and numbness in your buttocks and down the back of your leg. Piriformis syndrome happens when the small muscle that connects the base of your spine to your hip (piriformis muscle) presses on the nerve that runs down the back of your leg (sciatic nerve).  The piriformis muscle helps your hip rotate and helps to bring your leg back and out. It also helps shift your weight while you are walking to keep you stable. The sciatic nerve runs under or through the piriformis. Damage to the piriformis muscle can cause spasms that put pressure on the nerve below. This causes pain and discomfort while sitting and moving. The pain may feel as if it begins in the buttock and spreads (radiates) down your hip and thigh.  What are the causes?  This condition is caused by pressure on the sciatic nerve from the piriformis muscle. The piriformis muscle can get irritated with overuse, especially if other hip muscles are weak and the piriformis has to do extra work. Piriformis syndrome can also occur after an injury, like a fall onto your buttocks.  What increases the risk?  This condition is more likely to develop in:  · Women.  · People who sit for long periods of time.  · Cyclists.  · People who have weak buttocks muscles (gluteal muscles).  What are the signs or symptoms?  Pain, tingling, or numbness that starts in the buttock and runs down the back of your leg (sciatica) is the most common symptom of this condition. Your symptoms may:  · Get worse the longer you sit.  · Get worse when you walk, run, or go up on stairs.  How is this diagnosed?  This condition is diagnosed based on your symptoms, medical history, and physical exam. During this exam, your health care provider may move your leg into different positions to check for pain. He or she will also press on the muscles of your hip and buttock to see if that increases your symptoms. You may also have an  X-ray or MRI.  How is this treated?  Treatment for this condition may include:  · Stopping all activities that cause pain or make your condition worse.  · Using heat or ice to relieve pain as told by your health care provider.  · Taking medicines to reduce pain and swelling.  · Taking a muscle relaxer to release the piriformis muscle.  · Doing range-of-motion and strengthening exercises (physical therapy) as told by your health care provider.  · Massaging the affected area.  · Getting an injection of an anti-inflammatory medicine or muscle relaxer to reduce inflammation and muscle tension.  In rare cases, you may need surgery to cut the muscle and release pressure on the nerve if other treatments do not work.  Follow these instructions at home:  · Take over-the-counter and prescription medicines only as told by your health care provider.  · Do not sit for long periods. Get up and walk around every 20 minutes or as often as told by your health care provider.  · If directed, apply heat to the affected area as often as told by your health care provider. Use the heat source that your health care provider recommends, such as a moist heat pack or a heating pad.  ? Place a towel between your skin and the heat source.  ? Leave the heat on for 20-30 minutes.  ? Remove the   heat if your skin turns bright red. This is especially important if you are unable to feel pain, heat, or cold. You may have a greater risk of getting burned.  · If directed, apply ice to the injured area.  ? Put ice in a plastic bag.  ? Place a towel between your skin and the bag.  ? Leave the ice on for 20 minutes, 2-3 times a day.  · Do exercises as told by your health care provider.  · Return to your normal activities as told by your health care provider. Ask your health care provider what activities are safe for you.  · Keep all follow-up visits as told by your health care provider. This is important.  How is this prevented?  · Do not sit for longer  than 20 minutes at a time. When you sit, choose padded surfaces.  · Warm up and stretch before being active.  · Cool down and stretch after being active.  · Give your body time to rest between periods of activity.  · Make sure to use equipment that fits you.  · Maintain physical fitness, including:  ? Strength.  ? Flexibility.  Contact a health care provider if:  · Your pain and stiffness continue or get worse.  · Your leg or hip becomes weak.  · You have changes in your bowel function or bladder function.  This information is not intended to replace advice given to you by your health care provider. Make sure you discuss any questions you have with your health care provider.  Document Released: 03/12/2005 Document Revised: 11/15/2015 Document Reviewed: 02/22/2015  Elsevier Interactive Patient Education © 2019 [employer]

## 2018-07-01 NOTE — Assessment & Plan Note (Signed)
Chronic, ongoing.  Continue current medication regimen.  Refills sent.

## 2018-07-02 ENCOUNTER — Telehealth: Payer: Self-pay | Admitting: Nurse Practitioner

## 2018-07-02 NOTE — Telephone Encounter (Signed)
Erroneous entry

## 2018-09-16 NOTE — Telephone Encounter (Signed)
Called patient. Scheduled her for Monday the 29th at 9:15 for in office

## 2018-09-22 ENCOUNTER — Ambulatory Visit (INDEPENDENT_AMBULATORY_CARE_PROVIDER_SITE_OTHER): Payer: BC Managed Care – PPO | Admitting: Nurse Practitioner

## 2018-09-22 ENCOUNTER — Encounter: Payer: Self-pay | Admitting: Nurse Practitioner

## 2018-09-22 ENCOUNTER — Other Ambulatory Visit: Payer: Self-pay

## 2018-09-22 VITALS — BP 134/83 | HR 51 | Temp 98.7°F

## 2018-09-22 DIAGNOSIS — L2089 Other atopic dermatitis: Secondary | ICD-10-CM

## 2018-09-22 DIAGNOSIS — R591 Generalized enlarged lymph nodes: Secondary | ICD-10-CM | POA: Diagnosis not present

## 2018-09-22 DIAGNOSIS — L209 Atopic dermatitis, unspecified: Secondary | ICD-10-CM | POA: Insufficient documentation

## 2018-09-22 MED ORDER — CLOBETASOL PROPIONATE 0.05 % EX CREA: 1.0000 "application " | TOPICAL_CREAM | Freq: Two times a day (BID) | CUTANEOUS | 0 refills | Status: DC

## 2018-09-22 NOTE — Patient Instructions (Signed)

## 2018-09-22 NOTE — Assessment & Plan Note (Signed)
Under right jaw line, suspect related to rubbing area frequently.  Clobetasol cream ordered.  Recommend minimize touching of area.  Return for worsening or continued symptoms.

## 2018-09-22 NOTE — Assessment & Plan Note (Signed)
Present to right submanidbular area, no worsening per patient report.  Obtain CBC and CMP today.  No B symptoms present.  Monitor closely, return in 4 weeks for reassessment of area.

## 2018-09-22 NOTE — Progress Notes (Signed)
BP 134/83   Pulse (!) 51   Temp 98.7 F (37.1 C) (Oral)   LMP  (LMP Unknown)   SpO2 99%    Subjective:    Patient ID: Gabriella Hensley, female    DOB: Mar 21, 1966, 53 y.o.   MRN: [id number]  HPI: Gabriella Hensley is a 53 y.o. female  Chief Complaint  Patient presents with  . tongue pain    pt states a week and a half ago, she felt a sharp pain in her tongue that ran down into her neck and lymph nodes on the right side   TONGUE PAIN: Started 12 days ago.  When initially had pain she felt like she bit her tongue, but had not bit tongue.  States this is improving, but not 100%.  Reports she still feels a tingle sensation.  Initially pain was 10/10 and now 0/10.  Pain radiated from tongue down under the right side of her chin per her report.  Has history of lymphadenopathy on right side in June 2019. Did not take anything when pain was hurting at worst, no medications or treatment.  States neck would get stiff after pain presented.  Pain would last 5-10 minutes.  She denies recent illnesses or acute URI.  Denies fever, night sweats, weight loss, or fatigue.  Reports that the lymph node is not changing in size and has decreased with improvement in tongue pain.  RASH Reports this started under her chin because she pushes her mask down at work. Also endorses this is the area she rubs at often because she is feeling lymph node all the time, reports the rash was there before the tongue pain presented.  Denies blisters, burning, or discomfort with rash. Duration:  months  Location: face  Itching: yes Burning: no Redness: no Oozing: no Scaling: no Blisters: no Painful: no Fevers: no Change in detergents/soaps/personal care products: no Recent illness: no Recent travel:no History of same: no Context: stable Alleviating factors: benadryl Treatments attempted:benadryl Shortness of breath: no  Throat/tongue swelling: no Myalgias/arthralgias: no  Relevant past medical, surgical, family and  social history reviewed and updated as indicated. Interim medical history since our last visit reviewed. Allergies and medications reviewed and updated.  Review of Systems  Constitutional: Negative for activity change, appetite change, diaphoresis, fatigue and fever.  Respiratory: Negative for cough, chest tightness and shortness of breath.   Cardiovascular: Negative for chest pain, palpitations and leg swelling.  Gastrointestinal: Negative for abdominal distention, abdominal pain, constipation, diarrhea, nausea and vomiting.  Skin: Positive for rash.  Neurological: Negative for dizziness, syncope, weakness, light-headedness, numbness and headaches.  Psychiatric/Behavioral: Negative.     Per HPI unless specifically indicated above     Objective:    BP 134/83   Pulse (!) 51   Temp 98.7 F (37.1 C) (Oral)   LMP  (LMP Unknown)   SpO2 99%   Wt Readings from Last 3 Encounters:  07/01/18 226 lb (102.5 kg)  12/30/17 217 lb 3.2 oz (98.5 kg)  09/02/17 222 lb 6.4 oz (100.9 kg)    Physical Exam Vitals signs and nursing note reviewed.  Constitutional:      General: She is awake. She is not in acute distress.    Appearance: She is well-developed. She is obese. She is not ill-appearing.  HENT:     Head: Normocephalic.     Right Ear: Hearing, tympanic membrane, ear canal and external ear normal.     Left Ear: Hearing, tympanic membrane, ear canal  and external ear normal.     Nose: Nose normal. No mucosal edema or rhinorrhea.     Mouth/Throat:     Mouth: Mucous membranes are moist. No injury or lacerations.     Tongue: No lesions. Tongue does not deviate from midline.     Pharynx: No pharyngeal swelling, oropharyngeal exudate or posterior oropharyngeal erythema.     Comments: Tongue intact with no erythema or lesions noted.  Small, 1/2 cm, ulcer present to right side of mouth interior. Eyes:     General: Lids are normal.        Right eye: No discharge.        Left eye: No discharge.      Conjunctiva/sclera: Conjunctivae normal.     Pupils: Pupils are equal, round, and reactive to light.  Neck:     Musculoskeletal: Normal range of motion and neck supple.     Thyroid: No thyromegaly.     Vascular: No carotid bruit or JVD.  Cardiovascular:     Rate and Rhythm: Normal rate and regular rhythm.     Heart sounds: Normal heart sounds. No murmur. No gallop.   Pulmonary:     Effort: Pulmonary effort is normal.     Breath sounds: Normal breath sounds.  Abdominal:     General: Bowel sounds are normal.     Palpations: Abdomen is soft. There is no hepatomegaly or splenomegaly.  Musculoskeletal:     Right lower leg: No edema.     Left lower leg: No edema.  Lymphadenopathy:     Head:     Right side of head: Submandibular adenopathy present. No submental, tonsillar, preauricular, posterior auricular or occipital adenopathy.     Left side of head: No submental, submandibular, tonsillar, preauricular or posterior auricular adenopathy.     Cervical: No cervical adenopathy.     Right cervical: No superficial cervical adenopathy.    Left cervical: No superficial cervical adenopathy.     Upper Body:     Right upper body: No supraclavicular adenopathy.     Left upper body: No supraclavicular adenopathy.     Comments: Right submandibular lymph note slightly enlarged with no tenderness on palpation or warmth noted.    Skin:    General: Skin is warm and dry.     Comments: Small patch of dry, white, scaling skin under right jaw line.  Approx 2 cm in size.  No drainage, erythema, warmth to area.  No vesicles.  Neurological:     Mental Status: She is alert and oriented to person, place, and time.  Psychiatric:        Attention and Perception: Attention normal.        Mood and Affect: Mood normal.        Behavior: Behavior normal. Behavior is cooperative.        Thought Content: Thought content normal.        Judgment: Judgment normal.     Results for orders placed or performed in  visit on 12/30/17  Comp Met (CMET)  Result Value Ref Range   Glucose 84 65 - 99 mg/dL   BUN 13 6 - 24 mg/dL   Creatinine, Ser 0.84 0.57 - 1.00 mg/dL   GFR calc non Af Amer 81 >59 mL/min/1.73   GFR calc Af Amer 93 >59 mL/min/1.73   BUN/Creatinine Ratio 15 9 - 23   Sodium 141 134 - 144 mmol/L   Potassium 4.5 3.5 - 5.2 mmol/L   Chloride 100 96 -  106 mmol/L   CO2 25 20 - 29 mmol/L   Calcium 10.1 8.7 - 10.2 mg/dL   Total Protein 7.2 6.0 - 8.5 g/dL   Albumin 4.4 3.5 - 5.5 g/dL   Globulin, Total 2.8 1.5 - 4.5 g/dL   Albumin/Globulin Ratio 1.6 1.2 - 2.2   Bilirubin Total 0.6 0.0 - 1.2 mg/dL   Alkaline Phosphatase 102 39 - 117 IU/L   AST 12 0 - 40 IU/L   ALT 13 0 - 32 IU/L  Lipid Profile  Result Value Ref Range   Cholesterol, Total 230 (H) 100 - 199 mg/dL   Triglycerides 125 0 - 149 mg/dL   HDL 48 >39 mg/dL   VLDL Cholesterol Cal 25 5 - 40 mg/dL   LDL Calculated 157 (H) 0 - 99 mg/dL   Chol/HDL Ratio 4.8 (H) 0.0 - 4.4 ratio  TSH  Result Value Ref Range   TSH 1.020 0.450 - 4.500 uIU/mL  HgB A1c  Result Value Ref Range   Hgb A1c MFr Bld 5.9 (H) 4.8 - 5.6 %   Est. average glucose Bld gHb Est-mCnc 123 mg/dL  CBC  Result Value Ref Range   WBC 5.5 3.4 - 10.8 x10E3/uL   RBC 4.37 3.77 - 5.28 x10E6/uL   Hemoglobin 12.5 11.1 - 15.9 g/dL   Hematocrit 37.4 34.0 - 46.6 %   MCV 86 79 - 97 fL   MCH 28.6 26.6 - 33.0 pg   MCHC 33.4 31.5 - 35.7 g/dL   RDW 12.4 12.3 - 15.4 %   Platelets 234 150 - 450 x10E3/uL      Assessment & Plan:   Problem List Items Addressed This Visit      Musculoskeletal and Integument   Atopic dermatitis    Under right jaw line, suspect related to rubbing area frequently.  Clobetasol cream ordered.  Recommend minimize touching of area.  Return for worsening or continued symptoms.        Immune and Lymphatic   Lymphadenopathy - Primary    Present to right submanidbular area, no worsening per patient report.  Obtain CBC and CMP today.  No B symptoms present.   Monitor closely, return in 4 weeks for reassessment of area.        Relevant Orders   CBC with Differential/Platelet   Comp Met (CMET)       Follow up plan: Return in about 4 weeks (around 10/20/2018) for Lymph node check.

## 2018-09-23 LAB — COMPREHENSIVE METABOLIC PANEL
ALT: 14 IU/L (ref 0–32)
AST: 13 IU/L (ref 0–40)
Albumin/Globulin Ratio: 1.8 (ref 1.2–2.2)
Albumin: 4.3 g/dL (ref 3.8–4.9)
Alkaline Phosphatase: 110 IU/L (ref 39–117)
BUN/Creatinine Ratio: 10 (ref 9–23)
BUN: 8 mg/dL (ref 6–24)
Bilirubin Total: 0.4 mg/dL (ref 0.0–1.2)
CO2: 25 mmol/L (ref 20–29)
Calcium: 9.3 mg/dL (ref 8.7–10.2)
Chloride: 106 mmol/L (ref 96–106)
Creatinine, Ser: 0.77 mg/dL (ref 0.57–1.00)
GFR calc Af Amer: 103 mL/min/{1.73_m2} (ref >59)
GFR calc non Af Amer: 89 mL/min/{1.73_m2} (ref >59)
Globulin, Total: 2.4 g/dL (ref 1.5–4.5)
Glucose: 100 mg/dL — ABNORMAL HIGH (ref 65–99)
Potassium: 4.4 mmol/L (ref 3.5–5.2)
Sodium: 144 mmol/L (ref 134–144)
Total Protein: 6.7 g/dL (ref 6.0–8.5)

## 2018-09-23 LAB — CBC WITH DIFFERENTIAL/PLATELET
Basophils Absolute: 0 10*3/uL (ref 0.0–0.2)
Basos: 1 %
EOS (ABSOLUTE): 0.1 10*3/uL (ref 0.0–0.4)
Eos: 3 %
Hematocrit: 39.2 % (ref 34.0–46.6)
Hemoglobin: 12.9 g/dL (ref 11.1–15.9)
Immature Grans (Abs): 0 10*3/uL (ref 0.0–0.1)
Immature Granulocytes: 0 %
Lymphocytes Absolute: 2 10*3/uL (ref 0.7–3.1)
Lymphs: 44 %
MCH: 29 pg (ref 26.6–33.0)
MCHC: 32.9 g/dL (ref 31.5–35.7)
MCV: 88 fL (ref 79–97)
Monocytes Absolute: 0.3 10*3/uL (ref 0.1–0.9)
Monocytes: 7 %
Neutrophils Absolute: 1.9 10*3/uL (ref 1.4–7.0)
Neutrophils: 45 %
Platelets: 221 10*3/uL (ref 150–450)
RBC: 4.45 x10E6/uL (ref 3.77–5.28)
RDW: 12.8 % (ref 11.7–15.4)
WBC: 4.3 10*3/uL (ref 3.4–10.8)

## 2018-09-26 ENCOUNTER — Telehealth: Payer: Self-pay | Admitting: Nurse Practitioner

## 2018-09-26 NOTE — Telephone Encounter (Signed)
Patient is to cancel the 10/01/2018 appt. She will keep the 10/21/2018.

## 2018-10-01 ENCOUNTER — Ambulatory Visit: Payer: BLUE CROSS/BLUE SHIELD | Admitting: Nurse Practitioner

## 2018-10-21 ENCOUNTER — Encounter: Payer: Self-pay | Admitting: Nurse Practitioner

## 2018-10-21 ENCOUNTER — Ambulatory Visit: Payer: BC Managed Care – PPO | Admitting: Nurse Practitioner

## 2018-10-21 ENCOUNTER — Other Ambulatory Visit: Payer: Self-pay

## 2018-10-21 VITALS — BP 136/85 | HR 56 | Temp 98.2°F

## 2018-10-21 DIAGNOSIS — R591 Generalized enlarged lymph nodes: Secondary | ICD-10-CM | POA: Diagnosis not present

## 2018-10-21 DIAGNOSIS — L2089 Other atopic dermatitis: Secondary | ICD-10-CM

## 2018-10-21 DIAGNOSIS — R7303 Prediabetes: Secondary | ICD-10-CM

## 2018-10-21 NOTE — Assessment & Plan Note (Signed)
Improved on exam today.  Trial daily Claritin or Allegra to assess if reduces episodes.  Return for worsening or continued symptoms.  No B symptoms present today.

## 2018-10-21 NOTE — Patient Instructions (Signed)
Allergic Rhinitis, Adult Allergic rhinitis is a reaction to allergens in the air. Allergens are tiny specks (particles) in the air that cause your body to have an allergic reaction. This condition cannot be passed from person to person (is not contagious). Allergic rhinitis cannot be cured, but it can be controlled. There are two types of allergic rhinitis:  Seasonal. This type is also called hay fever. It happens only during certain times of the year.  Perennial. This type can happen at any time of the year. What are the causes? This condition may be caused by:  Pollen from grasses, trees, and weeds.  House dust mites.  Pet dander.  Mold. What are the signs or symptoms? Symptoms of this condition include:  Sneezing.  Runny or stuffy nose (nasal congestion).  A lot of mucus in the back of the throat (postnasal drip).  Itchy nose.  Tearing of the eyes.  Trouble sleeping.  Being sleepy during day. How is this treated? There is no cure for this condition. You should avoid things that trigger your symptoms (allergens). Treatment can help to relieve symptoms. This may include:  Medicines that block allergy symptoms, such as antihistamines. These may be given as a shot, nasal spray, or pill.  Shots that are given until your body becomes less sensitive to the allergen (desensitization).  Stronger medicines, if all other treatments have not worked. Follow these instructions at home: Avoiding allergens   Find out what you are allergic to. Common allergens include smoke, dust, and pollen.  Avoid them if you can. These are some of the things that you can do to avoid allergens: ? Replace carpet with wood, tile, or vinyl flooring. Carpet can trap dander and dust. ? Clean any mold found in the home. ? Do not smoke. Do not allow smoking in your home. ? Change your heating and air conditioning filter at least once a month. ? During allergy season:  Keep windows closed as much as  you can. If possible, use air conditioning when there is a lot of pollen in the air.  Use a special filter for allergies with your furnace and air conditioner.  Plan outdoor activities when pollen counts are lowest. This is usually during the early morning or evening hours.  If you do go outdoors when pollen count is high, wear a special mask for people with allergies.  When you come indoors, take a shower and change your clothes before sitting on furniture or bedding. General instructions  Do not use fans in your home.  Do not hang clothes outside to dry.  Wear sunglasses to keep pollen out of your eyes.  Wash your hands right away after you touch household pets.  Take over-the-counter and prescription medicines only as told by your doctor.  Keep all follow-up visits as told by your doctor. This is important. Contact a doctor if:  You have a fever.  You have a cough that does not go away (is persistent).  You start to make whistling sounds when you breathe (wheeze).  Your symptoms do not get better with treatment.  You have thick fluid coming from your nose.  You start to have nosebleeds. Get help right away if:  Your tongue or your lips are swollen.  You have trouble breathing.  You feel dizzy or you feel like you are going to pass out (faint).  You have cold sweats. Summary  Allergic rhinitis is a reaction to allergens in the air.  This condition may be   caused by allergens. These include pollen, dust mites, pet dander, and mold.  Symptoms include a runny, itchy nose, sneezing, or tearing eyes. You may also have trouble sleeping or feel sleepy during the day.  Treatment includes taking medicines and avoiding allergens. You may also get shots or take stronger medicines.  Get help if you have a fever or a cough that does not stop. Get help right away if you are short of breath. This information is not intended to replace advice given to you by your health care  provider. Make sure you discuss any questions you have with your health care provider. Document Released: 07/12/2010 Document Revised: 07/01/2018 Document Reviewed: 10/01/2017 Elsevier Patient Education  2020 [employer]  

## 2018-10-21 NOTE — Progress Notes (Signed)
BP 136/85   Pulse (!) 56   Temp 98.2 F (36.8 C) (Oral)   LMP  (LMP Unknown)   SpO2 98%    Subjective:    Patient ID: Gabriella Hensley, female    DOB: 1965-08-02, 53 y.o.   MRN: [id number]  HPI: Gabriella Hensley [place] is a 53 y.o. female  Chief Complaint  Patient presents with  . Follow-up  . Rash    pt states she has a rash on her neck and back that she noticed about 5 days ago, states it itches very bad.   RASH Showed up about 5 days ago, has been pruritic.  Reports she tried Clobetasol cream to area and this did not improve area.  She did try Cortisone 10 and this improved area, decreased itching sensation. Duration:  days  Location: back and neck  Itching: yes Burning: no Redness: no Oozing: no Scaling: no Blisters: no Painful: no Fevers: no Change in detergents/soaps/personal care products: uses different types of soap at home Recent illness: no Recent travel:no History of same: no Context: stable Alleviating factors: hydrocortisone cream and lotion/moisturizer Treatments attempted:hydrocortisone cream and lotion/moisturizer Shortness of breath: no  Throat/tongue swelling: no Myalgias/arthralgias: no   LYMPHADENOPATHY: Has history of episodes of lymphadenopathy, last 09/22/2018 with no B symptoms.  States she feels this issue goes and comes.  Reports she also has sinus issues that come and go on occasion.  Notices swollen lymph nodes more when sinuses are acting up.  Does not currently take any antihistamine.  PREDIABETES: A1C in October 5.9%, she would like this rechecked today. Hypoglycemic episodes:no Polydipsia/polyuria: no Visual disturbance: no Chest pain: no Paresthesias: no   Relevant past medical, surgical, family and social history reviewed and updated as indicated. Interim medical history since our last visit reviewed. Allergies and medications reviewed and updated.  Review of Systems  Constitutional: Negative for activity change, appetite change,  diaphoresis, fatigue and fever.  HENT: Negative for congestion, ear discharge, ear pain, postnasal drip, rhinorrhea, sinus pressure, sinus pain, sneezing and voice change.   Respiratory: Negative for cough, chest tightness and shortness of breath.   Cardiovascular: Negative for chest pain, palpitations and leg swelling.  Gastrointestinal: Negative for abdominal distention, abdominal pain, constipation, diarrhea, nausea and vomiting.  Skin: Positive for rash.  Neurological: Negative for dizziness, syncope, weakness, light-headedness, numbness and headaches.  Psychiatric/Behavioral: Negative.     Per HPI unless specifically indicated above     Objective:    BP 136/85   Pulse (!) 56   Temp 98.2 F (36.8 C) (Oral)   LMP  (LMP Unknown)   SpO2 98%   Wt Readings from Last 3 Encounters:  07/01/18 226 lb (102.5 kg)  12/30/17 217 lb 3.2 oz (98.5 kg)  09/02/17 222 lb 6.4 oz (100.9 kg)    Physical Exam Vitals signs and nursing note reviewed.  Constitutional:      General: She is awake. She is not in acute distress.    Appearance: She is well-developed and overweight. She is not ill-appearing.  HENT:     Head: Normocephalic.     Right Ear: Hearing normal.     Left Ear: Hearing normal.     Nose: Nose normal.     Mouth/Throat:     Mouth: Mucous membranes are moist.  Eyes:     General: Lids are normal.        Right eye: No discharge.        Left eye: No discharge.  Conjunctiva/sclera: Conjunctivae normal.     Pupils: Pupils are equal, round, and reactive to light.  Neck:     Musculoskeletal: Normal range of motion and neck supple.  Cardiovascular:     Rate and Rhythm: Normal rate and regular rhythm.     Heart sounds: Normal heart sounds. No murmur. No gallop.   Pulmonary:     Effort: Pulmonary effort is normal. No accessory muscle usage or respiratory distress.     Breath sounds: Normal breath sounds.  Abdominal:     General: Bowel sounds are normal.     Palpations: Abdomen  is soft.  Musculoskeletal:     Right lower leg: No edema.     Left lower leg: No edema.  Lymphadenopathy:     Head:     Right side of head: No submental, submandibular, tonsillar, preauricular, posterior auricular or occipital adenopathy.     Left side of head: No submental, submandibular, tonsillar, preauricular, posterior auricular or occipital adenopathy.     Cervical: No cervical adenopathy.  Skin:    General: Skin is warm and dry.       Neurological:     Mental Status: She is alert and oriented to person, place, and time.  Psychiatric:        Attention and Perception: Attention normal.        Mood and Affect: Mood normal.        Speech: Speech normal.        Behavior: Behavior normal. Behavior is cooperative.        Thought Content: Thought content normal.        Judgment: Judgment normal.     Results for orders placed or performed in visit on 09/22/18  CBC with Differential/Platelet  Result Value Ref Range   WBC 4.3 3.4 - 10.8 x10E3/uL   RBC 4.45 3.77 - 5.28 x10E6/uL   Hemoglobin 12.9 11.1 - 15.9 g/dL   Hematocrit 39.2 34.0 - 46.6 %   MCV 88 79 - 97 fL   MCH 29.0 26.6 - 33.0 pg   MCHC 32.9 31.5 - 35.7 g/dL   RDW 12.8 11.7 - 15.4 %   Platelets 221 150 - 450 x10E3/uL   Neutrophils 45 Not Estab. %   Lymphs 44 Not Estab. %   Monocytes 7 Not Estab. %   Eos 3 Not Estab. %   Basos 1 Not Estab. %   Neutrophils Absolute 1.9 1.4 - 7.0 x10E3/uL   Lymphocytes Absolute 2.0 0.7 - 3.1 x10E3/uL   Monocytes Absolute 0.3 0.1 - 0.9 x10E3/uL   EOS (ABSOLUTE) 0.1 0.0 - 0.4 x10E3/uL   Basophils Absolute 0.0 0.0 - 0.2 x10E3/uL   Immature Granulocytes 0 Not Estab. %   Immature Grans (Abs) 0.0 0.0 - 0.1 x10E3/uL  Comp Met (CMET)  Result Value Ref Range   Glucose 100 (H) 65 - 99 mg/dL   BUN 8 6 - 24 mg/dL   Creatinine, Ser 0.77 0.57 - 1.00 mg/dL   GFR calc non Af Amer 89 >59 mL/min/1.73   GFR calc Af Amer 103 >59 mL/min/1.73   BUN/Creatinine Ratio 10 9 - 23   Sodium 144 134 - 144  mmol/L   Potassium 4.4 3.5 - 5.2 mmol/L   Chloride 106 96 - 106 mmol/L   CO2 25 20 - 29 mmol/L   Calcium 9.3 8.7 - 10.2 mg/dL   Total Protein 6.7 6.0 - 8.5 g/dL   Albumin 4.3 3.8 - 4.9 g/dL   Globulin, Total 2.4 1.5 -  4.5 g/dL   Albumin/Globulin Ratio 1.8 1.2 - 2.2   Bilirubin Total 0.4 0.0 - 1.2 mg/dL   Alkaline Phosphatase 110 39 - 117 IU/L   AST 13 0 - 40 IU/L   ALT 14 0 - 32 IU/L      Assessment & Plan:   Problem List Items Addressed This Visit      Musculoskeletal and Integument   Atopic dermatitis - Primary    Suspect related to allergies.  Trial daily Claritin or Allegra + continue use of Clobetasol or Hydrocortisone cream as needed.  Return to office for worsening or continued issues.        Immune and Lymphatic   Lymphadenopathy    Improved on exam today.  Trial daily Claritin or Allegra to assess if reduces episodes.  Return for worsening or continued symptoms.  No B symptoms present today.        Other   Prediabetes    Repeat A1C today, continue focus on diet.      Relevant Orders   HgB A1c       Follow up plan: Return if symptoms worsen or fail to improve.

## 2018-10-21 NOTE — Assessment & Plan Note (Signed)
Suspect related to allergies.  Trial daily Claritin or Allegra + continue use of Clobetasol or Hydrocortisone cream as needed.  Return to office for worsening or continued issues.

## 2018-10-21 NOTE — Assessment & Plan Note (Signed)
Repeat A1C today, continue focus on diet.

## 2018-10-22 LAB — HEMOGLOBIN A1C
Est. average glucose Bld gHb Est-mCnc: 120 mg/dL
Hgb A1c MFr Bld: 5.8 % — ABNORMAL HIGH (ref 4.8–5.6)

## 2018-11-17 ENCOUNTER — Ambulatory Visit: Payer: BC Managed Care – PPO | Admitting: Nurse Practitioner

## 2018-11-25 ENCOUNTER — Ambulatory Visit: Payer: BC Managed Care – PPO | Admitting: Nurse Practitioner

## 2018-12-03 ENCOUNTER — Other Ambulatory Visit: Payer: Self-pay

## 2018-12-03 ENCOUNTER — Ambulatory Visit (INDEPENDENT_AMBULATORY_CARE_PROVIDER_SITE_OTHER): Payer: BC Managed Care – PPO | Admitting: Nurse Practitioner

## 2018-12-03 ENCOUNTER — Encounter: Payer: Self-pay | Admitting: Nurse Practitioner

## 2018-12-03 VITALS — BP 118/90 | HR 99 | Temp 98.6°F | Ht 67.0 in | Wt 238.0 lb

## 2018-12-03 DIAGNOSIS — L2089 Other atopic dermatitis: Secondary | ICD-10-CM | POA: Diagnosis not present

## 2018-12-03 DIAGNOSIS — M25551 Pain in right hip: Secondary | ICD-10-CM | POA: Diagnosis not present

## 2018-12-03 DIAGNOSIS — Z23 Encounter for immunization: Secondary | ICD-10-CM

## 2018-12-03 NOTE — Progress Notes (Signed)
BP 118/90   Pulse 99   Temp 98.6 F (37 C) (Oral)   Ht 5\' 7"  (1.702 m)   Wt 238 lb (108 kg)   LMP  (LMP Unknown)   SpO2 98%   BMI 37.28 kg/m    Subjective:    Patient ID: Gabriella Hensley, female    DOB: 1966/03/23, 53 y.o.   MRN: [id number]  HPI: Gabriella Hensley is a 53 y.o. female  Chief Complaint  Patient presents with  . Rash    F/u pt states she has been feeling better   RASH Here for f/u on rash seen 10/21/18 and treated with Clobetasol.  States this has improved areas and no further issues or concerns.    HIP PAIN (RIGHT) Had similar episode in April, which improved some with Prednisone taper.  This helped for short period only, then the pain returned.  Noticed it more when going up stairs or walking.  Does not wish to go to physical therapy due to cost, $100 a visit.  Discussed alternate options, chiropractor, ice/heat, Tylenol, Aleeve, yoga, weight loss. Duration: months Involved hip: right  Mechanism of injury: unknown Location: posterior, lower, close to top femur Onset: gradual  Severity: mild  Quality: sharp Frequency: intermittent Radiation: no Aggravating factors: walking and movement  , going up stairs and lifting leg Alleviating factors: has not been using anything Status: stable Treatments attempted: Prednisone Relief with NSAIDs?: No NSAIDs Taken Weakness with weight bearing: no Weakness with walking: no Paresthesias / decreased sensation: no Swelling: no Redness:no Fevers: no   Relevant past medical, surgical, family and social history reviewed and updated as indicated. Interim medical history since our last visit reviewed. Allergies and medications reviewed and updated.  Review of Systems  Constitutional: Negative for activity change, appetite change, diaphoresis, fatigue and fever.  Respiratory: Negative for cough, chest tightness, shortness of breath and wheezing.   Cardiovascular: Negative for chest pain, palpitations and leg swelling.   Gastrointestinal: Negative for abdominal distention, abdominal pain, constipation, diarrhea, nausea and vomiting.  Musculoskeletal: Positive for arthralgias.  Psychiatric/Behavioral: Negative.     Per HPI unless specifically indicated above     Objective:    BP 118/90   Pulse 99   Temp 98.6 F (37 C) (Oral)   Ht 5\' 7"  (1.702 m)   Wt 238 lb (108 kg)   LMP  (LMP Unknown)   SpO2 98%   BMI 37.28 kg/m   Wt Readings from Last 3 Encounters:  12/03/18 238 lb (108 kg)  07/01/18 226 lb (102.5 kg)  12/30/17 217 lb 3.2 oz (98.5 kg)    Physical Exam Vitals signs and nursing note reviewed.  Constitutional:      General: She is awake. She is not in acute distress.    Appearance: She is well-developed. She is obese. She is not ill-appearing.  HENT:     Head: Normocephalic.     Right Ear: Hearing normal.     Left Ear: Hearing normal.  Eyes:     General: Lids are normal.        Right eye: No discharge.        Left eye: No discharge.     Conjunctiva/sclera: Conjunctivae normal.     Pupils: Pupils are equal, round, and reactive to light.  Neck:     Musculoskeletal: Normal range of motion and neck supple.     Vascular: No carotid bruit.  Cardiovascular:     Rate and Rhythm: Normal rate and regular  rhythm.     Heart sounds: Normal heart sounds. No murmur. No gallop.   Pulmonary:     Effort: Pulmonary effort is normal. No accessory muscle usage or respiratory distress.     Breath sounds: Normal breath sounds.  Abdominal:     General: Bowel sounds are normal.     Palpations: Abdomen is soft.  Musculoskeletal:     Right hip: She exhibits normal range of motion, normal strength, no tenderness, no bony tenderness, no swelling, no crepitus and no laceration.     Left hip: She exhibits normal range of motion, normal strength, no tenderness, no swelling, no crepitus and no laceration.     Right lower leg: No edema.     Left lower leg: No edema.     Comments: Right hip with mild discomfort  reported with flexion, adduction, external rotation.  None with extension, abduction, or internal rotation.  No point tenderness.  No rashes or bruising.  Skin:    General: Skin is warm and dry.  Neurological:     Mental Status: She is alert and oriented to person, place, and time.  Psychiatric:        Attention and Perception: Attention normal.        Mood and Affect: Mood normal.        Behavior: Behavior normal. Behavior is cooperative.        Thought Content: Thought content normal.        Judgment: Judgment normal.     Results for orders placed or performed in visit on 10/21/18  HgB A1c  Result Value Ref Range   Hgb A1c MFr Bld 5.8 (H) 4.8 - 5.6 %   Est. average glucose Bld gHb Est-mCnc 120 mg/dL      Assessment & Plan:   Problem List Items Addressed This Visit      Musculoskeletal and Integument   Atopic dermatitis - Primary    Improved.  Continue current treatment regimen, Clobetasol.        Other   Acute right hip pain    Wax and wanes, suspect some OA related to obesity.  She refuses physical therapy, is agreeable to imaging.  Will obtain hip XR.  Recommend Tylenol or Aleeve as needed, alternate heat/ice, yoga stretches at home.  Do recommend focus on healthy diet and weight loss.  She is interested in chiropractor and is going to see if covered by her insurance.  Return to office for worsening or continued issues.      Relevant Orders   DG Hip Unilat W OR W/O Pelvis 2-3 Views Right    Other Visit Diagnoses    Flu vaccine need       Relevant Orders   Flu Vaccine QUAD 36+ mos IM (Completed)       Follow up plan: Return in about 3 months (around 03/04/2019) for HLD, Prediabetes.

## 2018-12-03 NOTE — Assessment & Plan Note (Signed)
Improved.  Continue current treatment regimen, Clobetasol.

## 2018-12-03 NOTE — Assessment & Plan Note (Signed)
Wax and wanes, suspect some OA related to obesity.  She refuses physical therapy, is agreeable to imaging.  Will obtain hip XR.  Recommend Tylenol or Aleeve as needed, alternate heat/ice, yoga stretches at home.  Do recommend focus on healthy diet and weight loss.  She is interested in chiropractor and is going to see if covered by her insurance.  Return to office for worsening or continued issues.

## 2018-12-03 NOTE — Patient Instructions (Signed)
[address], [place], [place] [address] [phone]  Hip Pain  The hip is the joint between the upper legs and the lower pelvis. The bones, cartilage, tendons, and muscles of your hip joint support your body and allow you to move around. Hip pain can range from a minor ache to severe pain in one or both of your hips. The pain may be felt on the inside of the hip joint near the groin, or the outside near the buttocks and upper thigh. You may also have swelling or stiffness. Follow these instructions at home: Managing pain, stiffness, and swelling  If directed, apply ice to the injured area. ? Put ice in a plastic bag. ? Place a towel between your skin and the bag. ? Leave the ice on for 20 minutes, 2-3 times a day  Sleep with a pillow between your legs on your most comfortable side.  Avoid any activities that cause pain. General instructions  Take over-the-counter and prescription medicines only as told by your health care provider.  Do any exercises as told by your health care provider.  Record the following: ? How often you have hip pain. ? The location of your pain. ? What the pain feels like. ? What makes the pain worse.  Keep all follow-up visits as told by your health care provider. This is important. Contact a health care provider if:  You cannot put weight on your leg.  Your pain or swelling continues or gets worse after one week.  It gets harder to walk.  You have a fever. Get help right away if:  You fall.  You have a sudden increase in pain and swelling in your hip.  Your hip is red or swollen or very tender to touch. Summary  Hip pain can range from a minor ache to severe pain in one or both of your hips.  The pain may be felt on the inside of the hip joint near the groin, or the outside near the buttocks and upper thigh.  Avoid any activities that cause pain.  Record how often you have hip pain, the location of the pain, what makes it  worse and what it feels like. This information is not intended to replace advice given to you by your health care provider. Make sure you discuss any questions you have with your health care provider. Document Released: 08/30/2009 Document Revised: 02/22/2017 Document Reviewed: 02/13/2016 Elsevier Patient Education  2020 [employer].

## 2018-12-17 ENCOUNTER — Other Ambulatory Visit: Payer: Self-pay

## 2018-12-17 ENCOUNTER — Ambulatory Visit
Admission: RE | Admit: 2018-12-17 | Discharge: 2018-12-17 | Disposition: A | Discharge status: Home / Self Care | Payer: BC Managed Care – PPO | Source: Ambulatory Visit | Attending: Nurse Practitioner | Admitting: Nurse Practitioner

## 2018-12-17 DIAGNOSIS — M25551 Pain in right hip: Secondary | ICD-10-CM | POA: Diagnosis not present

## 2019-01-08 ENCOUNTER — Other Ambulatory Visit: Payer: Self-pay | Admitting: Nurse Practitioner

## 2019-01-08 DIAGNOSIS — Z1231 Encounter for screening mammogram for malignant neoplasm of breast: Secondary | ICD-10-CM

## 2019-01-09 ENCOUNTER — Ambulatory Visit
Admission: RE | Admit: 2019-01-09 | Discharge: 2019-01-09 | Disposition: A | Discharge status: Home / Self Care | Payer: BC Managed Care – PPO | Source: Ambulatory Visit | Attending: Nurse Practitioner | Admitting: Nurse Practitioner

## 2019-01-09 DIAGNOSIS — Z1231 Encounter for screening mammogram for malignant neoplasm of breast: Secondary | ICD-10-CM | POA: Diagnosis not present

## 2019-01-24 ENCOUNTER — Other Ambulatory Visit: Payer: Self-pay | Admitting: Unknown Physician Specialty

## 2019-01-24 NOTE — Telephone Encounter (Signed)
Requested medication (s) are due for refill today: yes  Requested medication (s) are on the active medication list: yes  Last refill:  12/23/2018  Future visit scheduled: yes  Notes to clinic:  Refill cannot be delegated   Requested Prescriptions  Pending Prescriptions Disp Refills   risperiDONE (RISPERDAL) 3 MG tablet [Pharmacy Med Name: risperiDONE 3 MG Oral Tablet] 30 tablet 0    Sig: Take 1 tablet by mouth once daily     Not Delegated - Psychiatry:  Antipsychotics - Second Generation (Atypical) - risperidone Failed - 01/24/2019  6:36 PM      Failed - This refill cannot be delegated      Failed - Prolactin Level (serum) in normal range and within 180 days    No results found for: PROLACTIN, TOTPROLACTIN       Passed - ALT in normal range and within 180 days    ALT  Date Value Ref Range Status  09/22/2018 14 0 - 32 IU/L Final   SGPT (ALT)  Date Value Ref Range Status  06/06/2011 23 U/L Final    Comment:    12-78 NOTE: NEW REFERENCE RANGE 02/16/2011          Passed - AST in normal range and within 180 days    AST  Date Value Ref Range Status  09/22/2018 13 0 - 40 IU/L Final   SGOT(AST)  Date Value Ref Range Status  06/06/2011 18 15 - 37 Unit/L Final         Passed - Valid encounter within last 6 months    Recent Outpatient Visits          1 month ago Other atopic dermatitis   [place], [place] T, NP   3 months ago Other atopic dermatitis   [place], [place] T, NP   4 months ago Lymphadenopathy   [place], [place] T, NP   6 months ago Bipolar 2 disorder ([place])   [place], Henrine Screws T, NP   1 year ago Need for Tdap vaccination   [place], Barbaraann Faster, NP      Future Appointments            In 1 month [place], Barbaraann Faster, NP [facility]

## 2019-02-09 ENCOUNTER — Ambulatory Visit
Admission: RE | Admit: 2019-02-09 | Discharge: 2019-02-09 | Disposition: A | Discharge status: Home / Self Care | Payer: Worker's Compensation | Source: Ambulatory Visit | Attending: Family Medicine | Admitting: Family Medicine

## 2019-02-09 ENCOUNTER — Other Ambulatory Visit: Payer: Self-pay | Admitting: Family Medicine

## 2019-02-09 DIAGNOSIS — M79672 Pain in left foot: Secondary | ICD-10-CM

## 2019-02-09 DIAGNOSIS — T1490XA Injury, unspecified, initial encounter: Secondary | ICD-10-CM | POA: Insufficient documentation

## 2019-03-10 ENCOUNTER — Encounter: Payer: Self-pay | Admitting: Nurse Practitioner

## 2019-03-10 ENCOUNTER — Ambulatory Visit (INDEPENDENT_AMBULATORY_CARE_PROVIDER_SITE_OTHER): Payer: BC Managed Care – PPO | Admitting: Nurse Practitioner

## 2019-03-10 ENCOUNTER — Other Ambulatory Visit: Payer: Self-pay

## 2019-03-10 DIAGNOSIS — R7303 Prediabetes: Secondary | ICD-10-CM

## 2019-03-10 DIAGNOSIS — E782 Mixed hyperlipidemia: Secondary | ICD-10-CM

## 2019-03-10 NOTE — Progress Notes (Signed)
LMP  (LMP Unknown)    Subjective:    Patient ID: Gabriella Hensley, female    DOB: July 20, 1965, 53 y.o.   MRN: [id number]  HPI: Gabriella Hensley is a 53 y.o. female  Chief Complaint  Patient presents with  . Hyperlipidemia  . Prediabetes    . This visit was completed via Doximity due to the restrictions of the COVID-19 pandemic. All issues as above were discussed and addressed. Physical exam was done as above through visual confirmation on Doximity. If it was felt that the patient should be evaluated in the office, they were directed there. The patient verbally consented to this visit. . Location of the patient: home . Location of the provider: work . Those involved with this call:  . Provider: Marnee Guarneri, DNP . CMA: Yvonna Alanis, CMA . Front Desk/Registration: Jill Side  . Time spent on call: 15 minutes with patient face to face via video conference. More than 50% of this time was spent in counseling and coordination of care. 10 minutes total spent in review of patient's record and preparation of their chart.  . I verified patient identity using two factors (patient name and date of birth). Patient consents verbally to being seen via telemedicine visit today.   HYPERLIPIDEMIA Hyperlipidemia status: good compliance Aspirin:  no The 10-year ASCVD risk score Mikey Bussing DC Jr., et al., 2013) is: 2.4%   Values used to calculate the score:     Age: 89 years     Sex: Female     Is Non-Hispanic African American: Yes     Diabetic: No     Tobacco smoker: No     Systolic Blood Pressure: 123456 mmHg     Is BP treated: No     HDL Cholesterol: 48 mg/dL     Total Cholesterol: 230 mg/dL Chest pain:  no Coronary artery disease:  no Family history CAD:  no Family history early CAD:  no   PREDIABETES Last A1C in October was 5.9% and is focusing diet. Polydipsia/polyuria: no Visual disturbance: no Chest pain: no Paresthesias: no  Relevant past medical, surgical, family and social  history reviewed and updated as indicated. Interim medical history since our last visit reviewed. Allergies and medications reviewed and updated.  Review of Systems  Constitutional: Negative for activity change, appetite change, diaphoresis, fatigue and fever.  Respiratory: Negative for cough, chest tightness and shortness of breath.   Cardiovascular: Negative for chest pain, palpitations and leg swelling.  Gastrointestinal: Negative for abdominal distention, abdominal pain, constipation, diarrhea, nausea and vomiting.  Endocrine: Negative for cold intolerance, heat intolerance, polydipsia, polyphagia and polyuria.  Neurological: Negative for dizziness, syncope, weakness, light-headedness, numbness and headaches.  Psychiatric/Behavioral: Negative.     Per HPI unless specifically indicated above     Objective:    LMP  (LMP Unknown)   Wt Readings from Last 3 Encounters:  12/03/18 238 lb (108 kg)  07/01/18 226 lb (102.5 kg)  12/30/17 217 lb 3.2 oz (98.5 kg)    Physical Exam Vitals and nursing note reviewed.  Constitutional:      General: She is awake. She is not in acute distress.    Appearance: She is well-developed. She is not ill-appearing.  HENT:     Head: Normocephalic.     Right Ear: Hearing normal.     Left Ear: Hearing normal.  Eyes:     General: Lids are normal.        Right eye: No discharge.  Left eye: No discharge.     Conjunctiva/sclera: Conjunctivae normal.  Pulmonary:     Effort: Pulmonary effort is normal. No accessory muscle usage or respiratory distress.  Musculoskeletal:     Cervical back: Normal range of motion.  Neurological:     Mental Status: She is alert and oriented to person, place, and time.  Psychiatric:        Attention and Perception: Attention normal.        Mood and Affect: Mood normal.        Behavior: Behavior normal. Behavior is cooperative.        Thought Content: Thought content normal.        Judgment: Judgment normal.      Results for orders placed or performed in visit on 10/21/18  HgB A1c  Result Value Ref Range   Hgb A1c MFr Bld 5.8 (H) 4.8 - 5.6 %   Est. average glucose Bld gHb Est-mCnc 120 mg/dL      Assessment & Plan:   Problem List Items Addressed This Visit      Other   Hyperlipidemia    Chronic, ongoing.  No current medications as ASCVD 2.4%.  Continue diet focus.  Will recheck lipid panel at next visit in April.      Prediabetes    Will plan to recheck A1C at follow-up in April.  Continue diet focus.         I discussed the assessment and treatment plan with the patient. The patient was provided an opportunity to ask questions and all were answered. The patient agreed with the plan and demonstrated an understanding of the instructions.   The patient was advised to call back or seek an in-person evaluation if the symptoms worsen or if the condition fails to improve as anticipated.   I provided 15  minutes of time during this encounter.  Follow up plan: Return in about 4 months (around 07/09/2019) for HLD, Prediabetes, Mood.

## 2019-03-10 NOTE — Assessment & Plan Note (Signed)
Chronic, ongoing.  No current medications as ASCVD 2.4%.  Continue diet focus.  Will recheck lipid panel at next visit in April.

## 2019-03-10 NOTE — Patient Instructions (Signed)

## 2019-03-10 NOTE — Assessment & Plan Note (Signed)
Will plan to recheck A1C at follow-up in April.  Continue diet focus.

## 2019-04-15 ENCOUNTER — Other Ambulatory Visit: Payer: Self-pay | Admitting: Unknown Physician Specialty

## 2019-04-15 MED ORDER — RISPERIDONE 3 MG PO TABS: 3.0000 mg | ORAL_TABLET | Freq: Every day | ORAL | 3 refills | Status: DC

## 2019-04-23 ENCOUNTER — Ambulatory Visit
Admission: RE | Admit: 2019-04-23 | Discharge: 2019-04-23 | Disposition: A | Discharge status: Home / Self Care | Payer: Worker's Compensation | Attending: Family Medicine | Admitting: Family Medicine

## 2019-04-23 ENCOUNTER — Ambulatory Visit
Admission: RE | Admit: 2019-04-23 | Discharge: 2019-04-23 | Disposition: A | Discharge status: Home / Self Care | Payer: Worker's Compensation | Source: Ambulatory Visit | Attending: Family Medicine | Admitting: Family Medicine

## 2019-04-23 ENCOUNTER — Encounter: Payer: Self-pay | Admitting: Family Medicine

## 2019-04-23 ENCOUNTER — Ambulatory Visit (INDEPENDENT_AMBULATORY_CARE_PROVIDER_SITE_OTHER): Payer: BC Managed Care – PPO | Admitting: Family Medicine

## 2019-04-23 ENCOUNTER — Other Ambulatory Visit: Payer: Self-pay

## 2019-04-23 VITALS — BP 138/87 | HR 57 | Temp 98.1°F | Ht 67.0 in | Wt 240.0 lb

## 2019-04-23 DIAGNOSIS — M25562 Pain in left knee: Secondary | ICD-10-CM | POA: Diagnosis not present

## 2019-04-23 MED ORDER — PREDNISONE 50 MG PO TABS: 50.0000 mg | ORAL_TABLET | Freq: Every day | ORAL | 0 refills | Status: DC

## 2019-04-23 NOTE — Patient Instructions (Signed)
Patellofemoral Pain Syndrome  Patellofemoral pain syndrome is a condition in which the tissue (cartilage) on the underside of the kneecap (patella) softens or breaks down. This causes pain in the front of the knee. The condition is also called runner's knee or chondromalacia patella. Patellofemoral pain syndrome is most common in young adults who are active in sports. The knee is the largest joint in the body. The patella covers the front of the knee and is attached to muscles above and below the knee. The underside of the patella is covered with a smooth type of cartilage (synovium). The smooth surface helps the patella to glide easily when you move your knee. Patellofemoral pain syndrome causes swelling in the joint linings and bone surfaces in the knee. What are the causes? This condition may be caused by:  Overuse of the knee.  Poor alignment of your knee joints.  Weak leg muscles.  A direct blow to your kneecap. What increases the risk? You are more likely to develop this condition if:  You do a lot of activities that can wear down your kneecap. These include: ? Running. ? Squatting. ? Climbing stairs.  You start a new physical activity or exercise program.  You wear shoes that do not fit well.  You do not have good leg strength.  You are overweight. What are the signs or symptoms? The main symptom of this condition is knee pain. This may feel like a dull, aching pain underneath your patella, in the front of your knee. There may be a popping or cracking sound when you move your knee. Pain may get worse with:  Exercise.  Climbing stairs.  Running.  Jumping.  Squatting.  Kneeling.  Sitting for a long time.  Moving or pushing on your patella. How is this diagnosed? This condition may be diagnosed based on:  Your symptoms and medical history. You may be asked about your recent physical activities and which ones cause knee pain.  A physical exam. This may  include: ? Moving your patella back and forth. ? Checking your range of knee motion. ? Having you squat or jump to see if you have pain. ? Checking the strength of your leg muscles.  Imaging tests to confirm the diagnosis. These may include an MRI of your knee. How is this treated? This condition may be treated at home with rest, ice, compression, and elevation (RICE).  Other treatments may include:  Nonsteroidal anti-inflammatory drugs (NSAIDs).  Physical therapy to stretch and strengthen your leg muscles.  Shoe inserts (orthotics) to take stress off your knee.  A knee brace or knee support.  Adhesive tapes to the skin.  Surgery to remove damaged cartilage or move the patella to a better position. This is rare. Follow these instructions at home: If you have a shoe or brace:  Wear the shoe or brace as told by your health care provider. Remove it only as told by your health care provider.  Loosen the shoe or brace if your toes tingle, become numb, or turn cold and blue.  Keep the shoe or brace clean.  If the shoe or brace is not waterproof: ? Do not let it get wet. ? Cover it with a watertight covering when you take a bath or a shower. Managing pain, stiffness, and swelling  If directed, put ice on the painful area. ? If you have a removable shoe or brace, remove it as told by your health care provider. ? Put ice in a plastic bag. ?  Place a towel between your skin and the bag. ? Leave the ice on for 20 minutes, 2-3 times a day.  Move your toes often to avoid stiffness and to lessen swelling.  Rest your knee: ? Avoid activities that cause knee pain. ? When sitting or lying down, raise (elevate) the injured area above the level of your heart, whenever possible. General instructions  Take over-the-counter and prescription medicines only as told by your health care provider.  Use splints, braces, knee supports, or walking aids as directed by your health care  provider.  Perform stretching and strengthening exercises as told by your health care provider or physical therapist.  Do not use any products that contain nicotine or tobacco, such as cigarettes and e-cigarettes. These can delay healing. If you need help quitting, ask your health care provider.  Return to your normal activities as told by your health care provider. Ask your health care provider what activities are safe for you.  Keep all follow-up visits as told by your health care provider. This is important. Contact a health care provider if:  Your symptoms get worse.  You are not improving with home care. Summary  Patellofemoral pain syndrome is a condition in which the tissue (cartilage) on the underside of the kneecap (patella) softens or breaks down.  This condition causes swelling in the joint linings and bone surfaces in the knee. This leads to pain in the front of the knee.  This condition may be treated at home with rest, ice, compression, and elevation (RICE).  Use splints, braces, knee supports, or walking aids as directed by your health care provider. This information is not intended to replace advice given to you by your health care provider. Make sure you discuss any questions you have with your health care provider. Document Revised: 04/22/2017 Document Reviewed: 04/22/2017 Elsevier Patient Education  [place].  Patellar Tendinitis Rehab Ask your health care provider which exercises are safe for you. Do exercises exactly as told by your health care provider and adjust them as directed. It is normal to feel mild stretching, pulling, tightness, or discomfort as you do these exercises. Stop right away if you feel sudden pain or your pain gets worse. Do not begin these exercises until told by your health care provider. Stretching and range-of-motion exercise This exercise warms up your muscles and joints and improves the movement and flexibility of your knee. The  exercise also helps to relieve pain and stiffness. Hamstring, doorway stretch This is an exercise in which you lie in a doorway and prop your leg on a wall to stretch the back of your knee and thigh (hamstring). 1. Lie on your back in front of a doorway with your left / right leg resting against the wall and your other leg flat on the floor in the doorway. There should be a slight bend in your left / right knee. 2. Straighten your left / right knee. You should feel a stretch behind your knee or thigh. If you do not, scoot your buttocks closer to the door. 3. Hold this position for __________ seconds. Repeat __________ times. Complete this exercise __________ times a day. Strengthening exercises These exercises build strength and endurance in your knee. Endurance is the ability to use your muscles for a long time, even after they get tired. Quadriceps, isometric This exercise stretches the muscles in front of your thigh (quadriceps) without moving your knee joint (isometric). 1. Lie on your back with your left / right  leg extended and your other knee bent. 2. Slowly tense the muscles in the front of your left / right thigh. When you do this, you should see your kneecap slide up toward your hip or see increased dimpling just above the knee. This motion will push the back of your knee toward the floor. If this is painful, try putting a rolled-up hand towel under your knee to support it in a bent position. Change the size of the towel to find a position that allows you to do this exercise without any pain. 3. For __________ seconds, hold the muscle as tight as you can without increasing your pain. 4. Relax the muscles slowly and completely. Repeat __________ times. Complete this exercise __________ times a day. Straight leg raises This exercise stretches the muscles in front of your thigh (quadriceps) and the muscles that move your hips (hip flexors). 1. Lie on your back with your left / right leg  extended and your other knee bent. 2. Tense the muscles in the front of your left / right thigh. When you do this, you should see your kneecap slide up or see increased dimpling just above the knee. 3. Keep these muscles tight as you raise your leg 4-6 inches (10-15 cm) off the floor. Do not let your moving knee bend. 4. Hold this position for __________ seconds. 5. Keep these muscles tense as you slowly lower your leg. 6. Relax your muscles slowly and completely. Repeat __________ times. Complete this exercise __________ times a day. Squats This is a weight-bearing exercise in which you bend your knees and lower your hips while engaging your thigh muscles. 1. Stand in front of a table, with your feet and knees pointing straight ahead. You may rest your hands on the table for balance but not for support. 2. Slowly bend your knees and lower your hips like you are going to sit in a chair. ? Keep your weight over your heels, not over your toes. ? Keep your lower legs upright so they are parallel with the table legs. ? Do not let your hips go lower than your knees. ? Do not bend lower than told by your health care provider. ? If your knee pain increases, do not bend as low. 3. Hold the squat position for __________ seconds. 4. Slowly push with your legs to return to standing. Do not use your hands to pull yourself to standing. Repeat __________ times. Complete this exercise __________ times a day. Step-downs This is an exercise in which you step down slowly while engaging your leg muscles. 1. Stand on the edge of a step. 2. Keeping your weight over your __________ heel, slowly bend your __________ knee to bring your __________ heel toward the floor. Lower your heel as far as you can while keeping control and without increasing any discomfort. ? Do not let your __________ knee come forward. ? Use your leg muscles, not gravity, to lower your body. ? Hold a wall or rail for balance if  needed. 3. Slowly push through your heel to lift your body weight back up. 4. Return to the starting position. Repeat __________ times. Complete this exercise __________ times a day. Straight leg raises This exercise strengthens the muscles that rotate the leg at the hip and move it away from your body (hip abductors). 1. Lie on your side with your left / right leg in the top position. Lie so your head, shoulder, knee, and hip line up. You may bend your lower  knee to help you keep your balance. 2. Roll your hips slightly forward, so that your hips are stacked directly over each other and your left / right knee is facing forward. 3. Leading with your heel, lift your top leg 4-6 inches (10-15 cm). You should feel the muscles in your outer hip lifting. ? Do not let your foot drift forward. ? Do not let your knee roll toward the ceiling. 4. Hold this position for __________ seconds. 5. Slowly lower your leg to the starting position. 6. Let your muscles relax completely after each repetition. Repeat __________ times. Complete this exercise __________ times a day. This information is not intended to replace advice given to you by your health care provider. Make sure you discuss any questions you have with your health care provider. Document Revised: 07/03/2018 Document Reviewed: 12/31/2017 Elsevier Patient Education  [place].

## 2019-04-23 NOTE — Progress Notes (Signed)
BP 138/87   Pulse (!) 57   Temp 98.1 F (36.7 C) (Oral)   Ht 5\' 7"  (1.702 m)   Wt 240 lb (108.9 kg)   LMP  (LMP Unknown)   SpO2 100%   BMI 37.59 kg/m    Subjective:    Patient ID: Gabriella Hensley, female    DOB: November 14, 1965, 54 y.o.   MRN: [id number]  HPI: Gabriella Hensley is a 54 y.o. female  Chief Complaint  Patient presents with  . Knee Pain    left. going on x 4 weeks. pt states has been taking aleve but not helping   KNEE PAIN- had been having problems with her L foot and was limping Duration: about a month Involved knee: left Mechanism of injury: unknown Location:anterior Onset: gradual Severity: severe  Quality:  sharp and aching Frequency: constant, worst when she gets up to move from sitting Radiation: no Aggravating factors: getting up from sitting, walking   Alleviating factors: nothing  Status: worse Treatments attempted: ice and aleve, heat  Relief with NSAIDs?:  mild Weakness with weight bearing or walking: yes with walking for a long period Sensation of giving way: no Locking: yes Popping: no Bruising: no Swelling: yes Redness: no Paresthesias/decreased sensation: no Fevers: no  Relevant past medical, surgical, family and social history reviewed and updated as indicated. Interim medical history since our last visit reviewed. Allergies and medications reviewed and updated.  Review of Systems  Constitutional: Negative.   Respiratory: Negative.   Cardiovascular: Negative.   Musculoskeletal: Positive for arthralgias. Negative for back pain, gait problem, joint swelling, myalgias, neck pain and neck stiffness.  Skin: Negative.   Neurological: Negative.   Psychiatric/Behavioral: Negative.     Per HPI unless specifically indicated above     Objective:    BP 138/87   Pulse (!) 57   Temp 98.1 F (36.7 C) (Oral)   Ht 5\' 7"  (1.702 m)   Wt 240 lb (108.9 kg)   LMP  (LMP Unknown)   SpO2 100%   BMI 37.59 kg/m   Wt Readings from Last 3 Encounters:   04/23/19 240 lb (108.9 kg)  12/03/18 238 lb (108 kg)  07/01/18 226 lb (102.5 kg)    Physical Exam Vitals and nursing note reviewed.  Constitutional:      General: She is not in acute distress.    Appearance: Normal appearance. She is not ill-appearing, toxic-appearing or diaphoretic.  HENT:     Head: Normocephalic and atraumatic.     Right Ear: External ear normal.     Left Ear: External ear normal.     Nose: Nose normal.     Mouth/Throat:     Mouth: Mucous membranes are moist.     Pharynx: Oropharynx is clear.  Eyes:     General: No scleral icterus.       Right eye: No discharge.        Left eye: No discharge.     Extraocular Movements: Extraocular movements intact.     Conjunctiva/sclera: Conjunctivae normal.     Pupils: Pupils are equal, round, and reactive to light.  Cardiovascular:     Rate and Rhythm: Normal rate and regular rhythm.     Pulses: Normal pulses.     Heart sounds: Normal heart sounds. No murmur. No friction rub. No gallop.   Pulmonary:     Effort: Pulmonary effort is normal. No respiratory distress.     Breath sounds: Normal breath sounds. No stridor. No wheezing,  rhonchi or rales.  Chest:     Chest wall: No tenderness.  Musculoskeletal:        General: Normal range of motion.     Cervical back: Normal range of motion and neck supple.     Comments: Tender along patellar line and patellar tendon, Negative mcmurray's, negative anterior and posterior drawer, negative apley's compression and distraction  Skin:    General: Skin is warm and dry.     Capillary Refill: Capillary refill takes less than 2 seconds.     Coloration: Skin is not jaundiced or pale.     Findings: No bruising, erythema, lesion or rash.  Neurological:     General: No focal deficit present.     Mental Status: She is alert and oriented to person, place, and time. Mental status is at baseline.  Psychiatric:        Mood and Affect: Mood normal.        Behavior: Behavior normal.         Thought Content: Thought content normal.        Judgment: Judgment normal.     Results for orders placed or performed in visit on 10/21/18  HgB A1c  Result Value Ref Range   Hgb A1c MFr Bld 5.8 (H) 4.8 - 5.6 %   Est. average glucose Bld gHb Est-mCnc 120 mg/dL      Assessment & Plan:   Problem List Items Addressed This Visit    None    Visit Diagnoses    Acute pain of left knee    -  Primary   Concern for patella-femoral syndrome. Will treat with prednisone and stretches- recheck 2 weeks. X-ray ordered. Call with any concerns.    Relevant Orders   DG Knee Complete 4 Views Left       Follow up plan: Return in about 2 weeks (around 05/07/2019) for follow up knee pain with PCP.

## 2019-04-27 ENCOUNTER — Telehealth: Payer: Self-pay | Admitting: Family Medicine

## 2019-04-27 DIAGNOSIS — M25562 Pain in left knee: Secondary | ICD-10-CM

## 2019-04-27 NOTE — Telephone Encounter (Signed)
Referral to Ortho placed. Please let her know about the walkin.

## 2019-04-27 NOTE — Telephone Encounter (Signed)
Copied from [place] [id number]. Topic: General - Inquiry >> Apr 27, 2019  3:44 PM Richardo Priest, NT wrote: Reason for CRM: Pt called in stating she would like an alternative to the predniSONE (DELTASONE) 50 MG tablet or to be sent to an orthopedic as she is still having pain in her knee and it has only gotten a little better. Please advise and call patient back.

## 2019-04-27 NOTE — Telephone Encounter (Signed)
Patient notified

## 2019-04-30 DIAGNOSIS — S8392XA Sprain of unspecified site of left knee, initial encounter: Secondary | ICD-10-CM | POA: Diagnosis not present

## 2019-04-30 DIAGNOSIS — M25562 Pain in left knee: Secondary | ICD-10-CM | POA: Diagnosis not present

## 2019-05-11 ENCOUNTER — Encounter: Payer: Self-pay | Admitting: Nurse Practitioner

## 2019-05-11 ENCOUNTER — Ambulatory Visit (INDEPENDENT_AMBULATORY_CARE_PROVIDER_SITE_OTHER): Payer: BC Managed Care – PPO | Admitting: Nurse Practitioner

## 2019-05-11 ENCOUNTER — Other Ambulatory Visit: Payer: Self-pay

## 2019-05-11 DIAGNOSIS — M25562 Pain in left knee: Secondary | ICD-10-CM | POA: Diagnosis not present

## 2019-05-11 NOTE — Assessment & Plan Note (Signed)
Ongoing with worsening.  She is being followed by [facility] with recommendation for MRI if no improvement after steroid injection and recent Prednisone.  Recommend she follow-up with [facility] today, discussed their walk-in clinic, for further assessment.  MRI would be beneficial to assess ligaments and meniscus.  She plans on going there today for follow-up.

## 2019-05-11 NOTE — Patient Instructions (Signed)

## 2019-05-11 NOTE — Progress Notes (Signed)
BP (!) 141/80   Pulse (!) 56   Temp 98.1 F (36.7 C) (Oral)   LMP  (LMP Unknown)   SpO2 98%    Subjective:    Patient ID: Gabriella Hensley, female    DOB: 11/05/1965, 54 y.o.   MRN: [id number]  HPI: Gabriella Hensley is a 54 y.o. female  Chief Complaint  Patient presents with  . Knee Pain    left knee, patient states the prednisone did not help, states cortisone from ortho did not help either   KNEE PAIN Imaging on 04/23/2019 showed no fracture or dislocation with spaces preserved.  She reports Prednisone was not beneficial and saw ortho with steroid injection, no benefit.  Has steroid injection to left knee on February 4th with ortho, she reports that ortho reported next steps would be MRI.  Was seen at [facility].  Has not done physical therapy.  Was told to take [facility] for two weeks on regular basis, this is not helping.  Eased it up, but not 100%.  Reports she is swollen on medial aspect. Duration: acute, weeks Involved knee: left Mechanism of injury: unknown Location:medial Onset: gradual Severity: 10/10  Quality:  sharp and aching Frequency: constant Radiation: yes Aggravating factors: weight bearing, walking and movement  Alleviating factors: ice, APAP, NSAIDs and rest  Status: worse Treatments attempted: rest, ice, APAP, ibuprofen and aleve  Relief with NSAIDs?:  mild Weakness with weight bearing or walking: yes Sensation of giving way: yes Locking: no Popping: yes Bruising: no Swelling: yes Redness: no Paresthesias/decreased sensation: no Fevers: no  Relevant past medical, surgical, family and social history reviewed and updated as indicated. Interim medical history since our last visit reviewed. Allergies and medications reviewed and updated.  Review of Systems  Constitutional: Negative for activity change, appetite change, diaphoresis, fatigue and fever.  Respiratory: Negative for cough, chest tightness and shortness of breath.   Cardiovascular: Negative for  chest pain, palpitations and leg swelling.  Musculoskeletal: Positive for arthralgias.  Psychiatric/Behavioral: Negative.     Per HPI unless specifically indicated above     Objective:    BP (!) 141/80   Pulse (!) 56   Temp 98.1 F (36.7 C) (Oral)   LMP  (LMP Unknown)   SpO2 98%   Wt Readings from Last 3 Encounters:  04/23/19 240 lb (108.9 kg)  12/03/18 238 lb (108 kg)  07/01/18 226 lb (102.5 kg)    Physical Exam Vitals and nursing note reviewed.  Constitutional:      General: She is awake. She is not in acute distress.    Appearance: She is well-developed and overweight. She is not ill-appearing.  HENT:     Head: Normocephalic.     Right Ear: Hearing normal.     Left Ear: Hearing normal.  Eyes:     General: Lids are normal.        Right eye: No discharge.        Left eye: No discharge.     Conjunctiva/sclera: Conjunctivae normal.     Pupils: Pupils are equal, round, and reactive to light.  Neck:     Vascular: No carotid bruit.  Cardiovascular:     Rate and Rhythm: Normal rate and regular rhythm.     Heart sounds: Normal heart sounds. No murmur. No gallop.   Pulmonary:     Effort: Pulmonary effort is normal. No accessory muscle usage or respiratory distress.     Breath sounds: Normal breath sounds.  Abdominal:  General: Bowel sounds are normal.     Palpations: Abdomen is soft. There is no hepatomegaly.  Musculoskeletal:     Cervical back: Normal range of motion and neck supple.     Right knee: Normal.     Left knee: Swelling and crepitus present. No erythema, ecchymosis or bony tenderness. Decreased range of motion. Tenderness present over the patellar tendon.     Right lower leg: No edema.     Left lower leg: No edema.     Comments: Decreased ROM, significant pain with ROM left knee.  Edema present to medial aspect.  Knee support in place.  Antalgic gait present.  Skin:    General: Skin is warm and dry.  Neurological:     Mental Status: She is alert and  oriented to person, place, and time.  Psychiatric:        Attention and Perception: Attention normal.        Mood and Affect: Mood normal.        Speech: Speech normal.        Behavior: Behavior normal. Behavior is cooperative.     Results for orders placed or performed in visit on 10/21/18  HgB A1c  Result Value Ref Range   Hgb A1c MFr Bld 5.8 (H) 4.8 - 5.6 %   Est. average glucose Bld gHb Est-mCnc 120 mg/dL      Assessment & Plan:   Problem List Items Addressed This Visit      Other   Left knee pain    Ongoing with worsening.  She is being followed by [facility] with recommendation for MRI if no improvement after steroid injection and recent Prednisone.  Recommend she follow-up with [facility] today, discussed their walk-in clinic, for further assessment.  MRI would be beneficial to assess ligaments and meniscus.  She plans on going there today for follow-up.            Follow up plan: Return in about 4 weeks (around 06/08/2019) for Left Knee Pain.

## 2019-05-12 ENCOUNTER — Encounter: Payer: Self-pay | Admitting: Nurse Practitioner

## 2019-05-12 DIAGNOSIS — M25562 Pain in left knee: Secondary | ICD-10-CM | POA: Diagnosis not present

## 2019-05-16 IMAGING — MG MM DIGITAL SCREENING BILAT W/ TOMO W/ CAD
8 of 15 series · 8 of 35 positions shown · non-contrast
Comparison: Previous exam(s).

CLINICAL DATA: Screening.

EXAM:
2D DIGITAL SCREENING BILATERAL MAMMOGRAM WITH CAD AND ADJUNCT TOMO

[R CC]
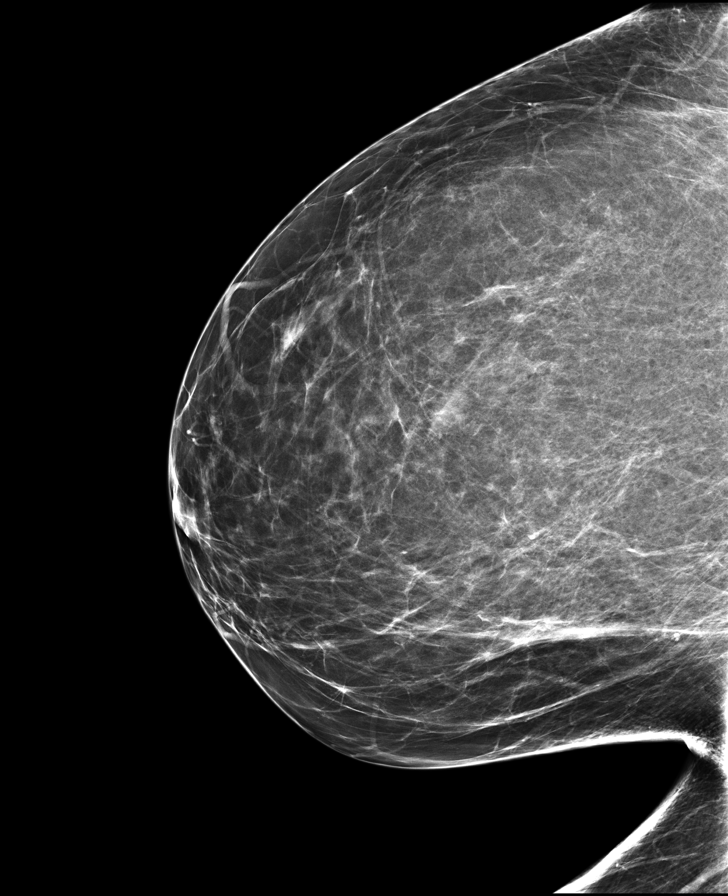

[L MLO]
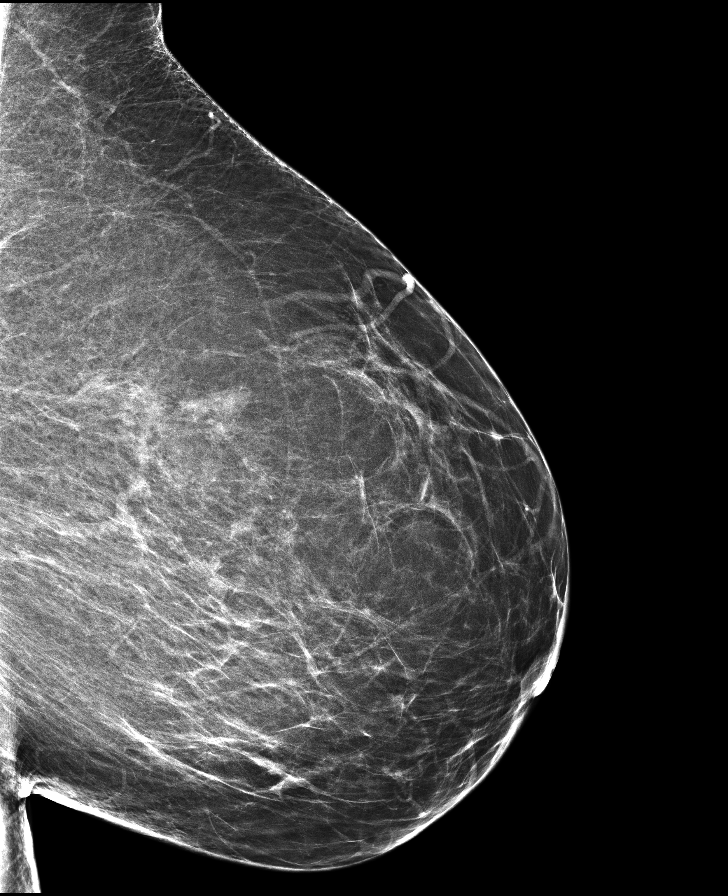

[L CC]
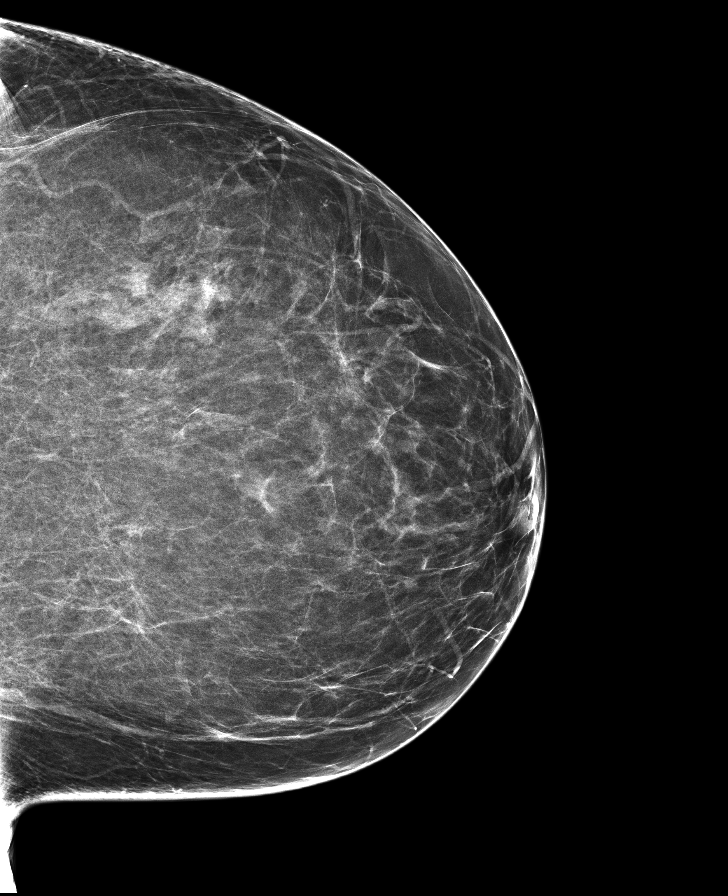

[L MLO synth-2D (1 of 2)]
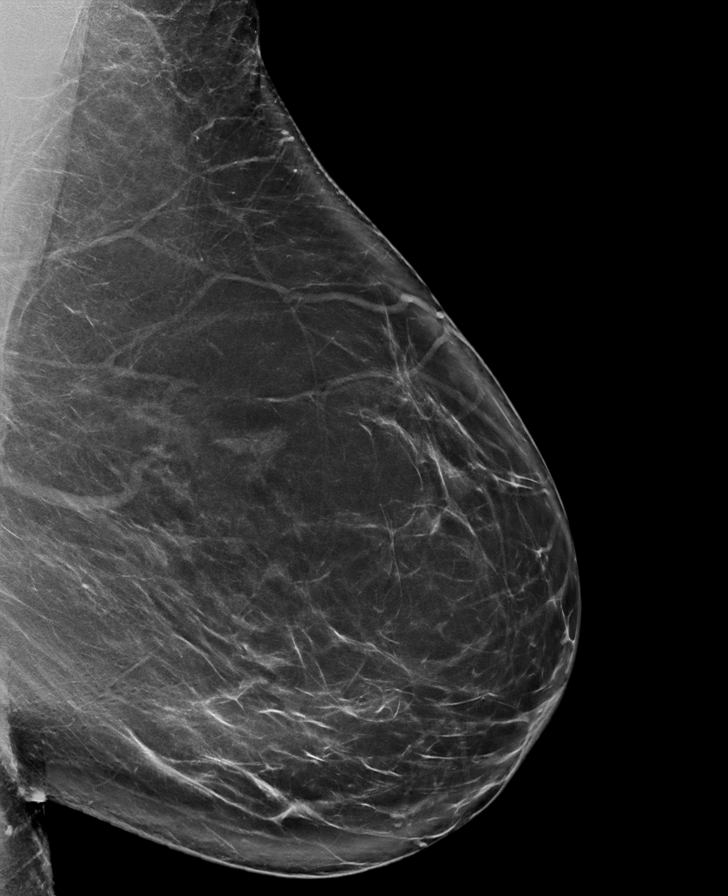

[R MLO synth-2D]
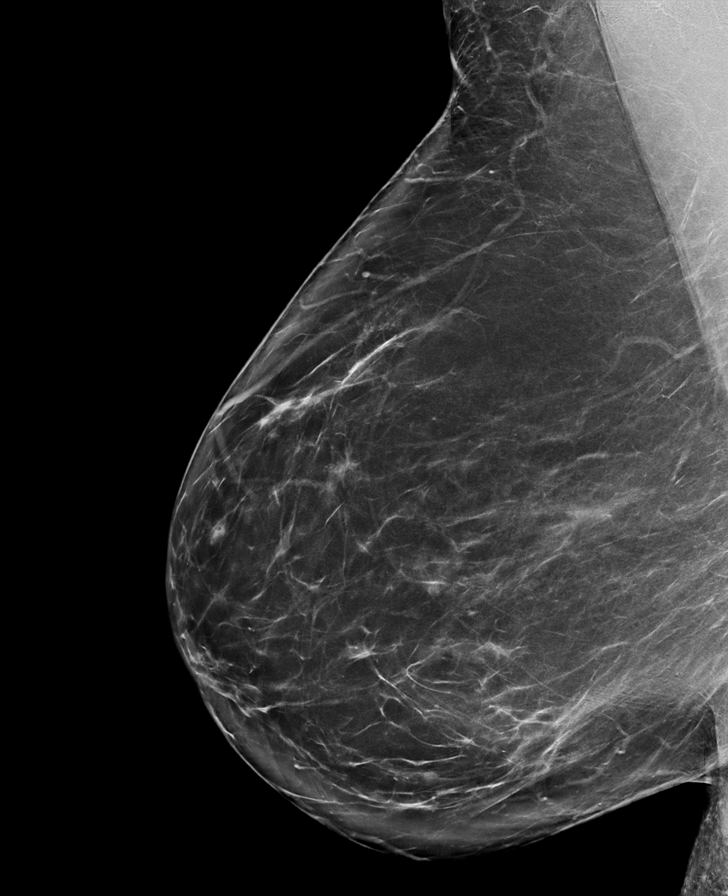

[R MLO]
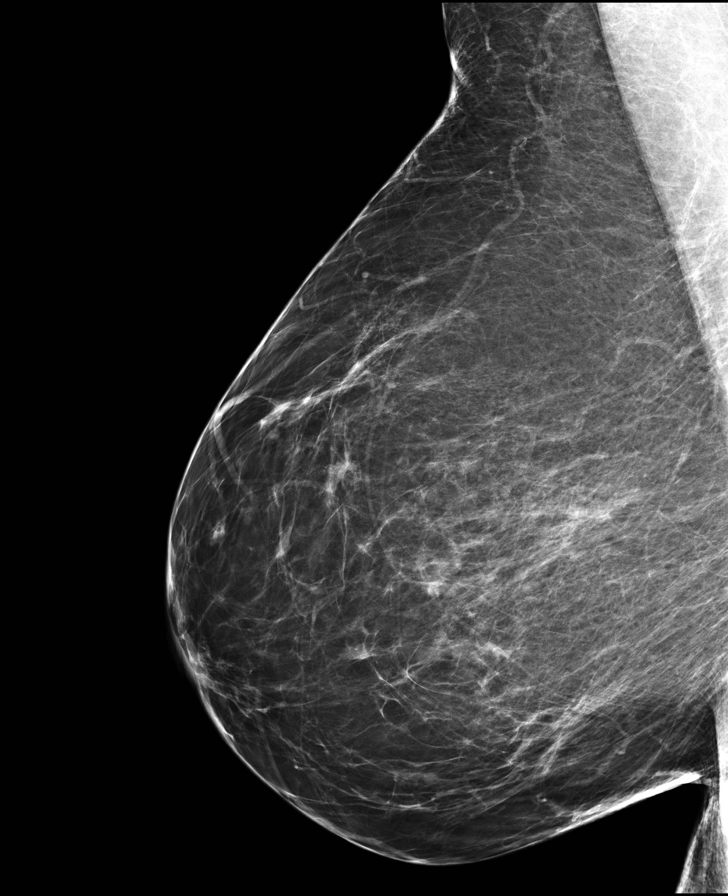

[L MLO synth-2D (2 of 2)]
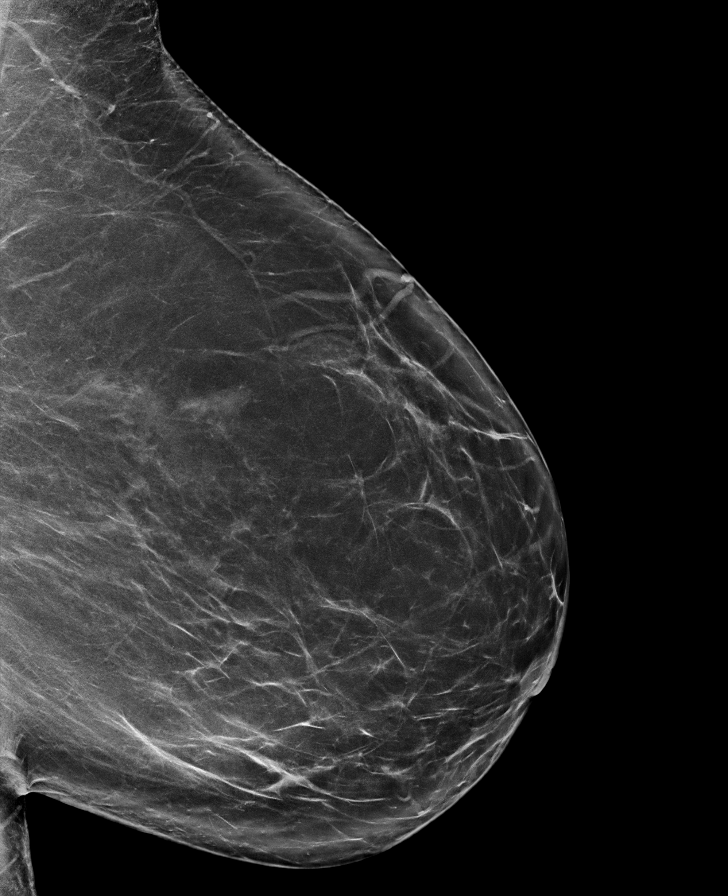

[R CC synth-2D]
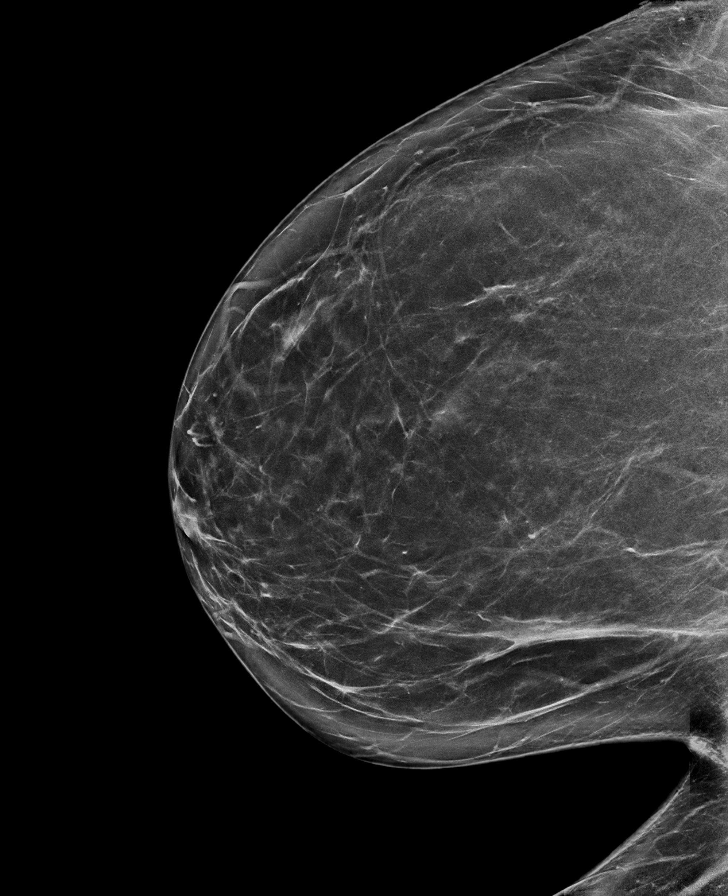

[8 of 35 positions shown; findings below may reference images not displayed]

ACR Breast Density Category b: There are scattered areas of
fibroglandular density.
FINDINGS: There are no findings suspicious for malignancy. Images were
processed with CAD.
IMPRESSION: No mammographic evidence of malignancy. A result letter of this
screening mammogram will be mailed directly to the patient.

RECOMMENDATION:
Screening mammogram in one year. (Code:97-6-RS4)

BI-RADS CATEGORY  1: Negative.

## 2019-05-25 DIAGNOSIS — M25562 Pain in left knee: Secondary | ICD-10-CM | POA: Diagnosis not present

## 2019-05-29 DIAGNOSIS — M25562 Pain in left knee: Secondary | ICD-10-CM | POA: Diagnosis not present

## 2019-05-30 ENCOUNTER — Encounter: Payer: Self-pay | Admitting: Nurse Practitioner

## 2019-06-01 ENCOUNTER — Encounter: Payer: Self-pay | Admitting: Nurse Practitioner

## 2019-06-08 ENCOUNTER — Ambulatory Visit: Payer: BC Managed Care – PPO | Admitting: Nurse Practitioner

## 2019-06-23 DIAGNOSIS — M1712 Unilateral primary osteoarthritis, left knee: Secondary | ICD-10-CM | POA: Diagnosis not present

## 2019-07-14 ENCOUNTER — Ambulatory Visit: Payer: BC Managed Care – PPO | Admitting: Nurse Practitioner

## 2019-07-14 ENCOUNTER — Other Ambulatory Visit: Payer: Self-pay

## 2019-07-14 ENCOUNTER — Encounter: Payer: Self-pay | Admitting: Nurse Practitioner

## 2019-07-14 VITALS — BP 120/77 | HR 60 | Temp 97.8°F | Wt 236.0 lb

## 2019-07-14 DIAGNOSIS — F3181 Bipolar II disorder: Secondary | ICD-10-CM

## 2019-07-14 DIAGNOSIS — R7303 Prediabetes: Secondary | ICD-10-CM

## 2019-07-14 DIAGNOSIS — E669 Obesity, unspecified: Secondary | ICD-10-CM

## 2019-07-14 DIAGNOSIS — E782 Mixed hyperlipidemia: Secondary | ICD-10-CM

## 2019-07-14 MED ORDER — BUPROPION HCL 100 MG PO TABS: 100.0000 mg | ORAL_TABLET | Freq: Every day | ORAL | 4 refills | Status: DC

## 2019-07-14 MED ORDER — RISPERIDONE 3 MG PO TABS: 3.0000 mg | ORAL_TABLET | Freq: Every day | ORAL | 4 refills | Status: DC

## 2019-07-14 NOTE — Progress Notes (Signed)
BP 120/77   Pulse 60   Temp 97.8 F (36.6 C) (Oral)   Wt 236 lb (107 kg)   LMP  (LMP Unknown)   SpO2 99%   BMI 36.96 kg/m    Subjective:    Patient ID: Gabriella Hensley, female    DOB: 12-07-1965, 54 y.o.   MRN: [id number]  HPI: Gabriella Hensley is a 54 y.o. female  Chief Complaint  Patient presents with  . Hyperlipidemia  . Prediabetes  . Mood   HYPERLIPIDEMIA Hyperlipidemia status: good compliance Aspirin:  no The 10-year ASCVD risk score Mikey Bussing DC Jr., et al., 2013) is: 2.8%   Values used to calculate the score:     Age: 20 years     Sex: Female     Is Non-Hispanic African American: Yes     Diabetic: No     Tobacco smoker: No     Systolic Blood Pressure: 123456 mmHg     Is BP treated: No     HDL Cholesterol: 48 mg/dL     Total Cholesterol: 230 mg/dL Chest pain:  no Coronary artery disease:  no Family history CAD:  no Family history early CAD:  no   PREDIABETES Last A1C in August 5.8% and is focusing diet. Polydipsia/polyuria: no Visual disturbance: no Chest pain: no Paresthesias: no  BIPOLAR DISORDER: Chronic, ongoing.  Continues on Wellbutrin and Risperdal with good control. Has been on Risperdal for 14 years.  Denies SI/HI. Mood status: stable Satisfied with current treatment?: yes Symptom severity: mild Duration of current treatment : chronic Side effects: no Medication compliance: good compliance Psychotherapy/counseling: none Depressed mood: no Anxious mood: no Anhedonia: no Significant weight loss or gain: no Insomnia: none Fatigue: no Feelings of worthlessness or guilt: no Impaired concentration/indecisiveness: no Suicidal ideations: no Hopelessness: no Crying spells: no Depression screen [facility] 2/9 07/14/2019 07/01/2018 12/30/2017 06/21/2017 12/19/2016  Decreased Interest 0 0 0 0 0  Down, Depressed, Hopeless 0 0 0 0 0  PHQ - 2 Score 0 0 0 0 0  Altered sleeping 0 0 0 0 0  Tired, decreased energy 0 0 0 0 0  Change in appetite 0 0 0 0 1  Feeling bad  or failure about yourself  0 0 0 0 0  Trouble concentrating 0 1 0 0 0  Moving slowly or fidgety/restless 0 0 0 0 0  Suicidal thoughts 0 0 0 0 0  PHQ-9 Score 0 1 0 0 1  Difficult doing work/chores - Not difficult at all - - -   Relevant past medical, surgical, family and social history reviewed and updated as indicated. Interim medical history since our last visit reviewed. Allergies and medications reviewed and updated.  Review of Systems  Constitutional: Negative for activity change, appetite change, diaphoresis, fatigue and fever.  Respiratory: Negative for cough, chest tightness and shortness of breath.   Cardiovascular: Negative for chest pain, palpitations and leg swelling.  Gastrointestinal: Negative.   Endocrine: Negative for cold intolerance, heat intolerance, polydipsia, polyphagia and polyuria.  Neurological: Negative.   Psychiatric/Behavioral: Negative.     Per HPI unless specifically indicated above     Objective:    BP 120/77   Pulse 60   Temp 97.8 F (36.6 C) (Oral)   Wt 236 lb (107 kg)   LMP  (LMP Unknown)   SpO2 99%   BMI 36.96 kg/m   Wt Readings from Last 3 Encounters:  07/14/19 236 lb (107 kg)  04/23/19 240 lb (108.9 kg)  12/03/18 238 lb (108 kg)    Physical Exam Vitals and nursing note reviewed.  Constitutional:      General: She is awake. She is not in acute distress.    Appearance: She is well-developed. She is obese. She is not ill-appearing.  HENT:     Head: Normocephalic.     Right Ear: Hearing normal.     Left Ear: Hearing normal.  Eyes:     General: Lids are normal.        Right eye: No discharge.        Left eye: No discharge.     Conjunctiva/sclera: Conjunctivae normal.     Pupils: Pupils are equal, round, and reactive to light.  Neck:     Vascular: No carotid bruit.  Cardiovascular:     Rate and Rhythm: Normal rate and regular rhythm.     Heart sounds: Normal heart sounds. No murmur. No gallop.   Pulmonary:     Effort: Pulmonary  effort is normal. No accessory muscle usage or respiratory distress.     Breath sounds: Normal breath sounds.  Abdominal:     General: Bowel sounds are normal.     Palpations: Abdomen is soft.  Musculoskeletal:     Cervical back: Normal range of motion and neck supple.     Right lower leg: No edema.     Left lower leg: No edema.  Skin:    General: Skin is warm and dry.  Neurological:     Mental Status: She is alert and oriented to person, place, and time.  Psychiatric:        Attention and Perception: Attention normal.        Mood and Affect: Mood normal.        Speech: Speech normal.        Behavior: Behavior normal. Behavior is cooperative.        Thought Content: Thought content normal.     Results for orders placed or performed in visit on 10/21/18  HgB A1c  Result Value Ref Range   Hgb A1c MFr Bld 5.8 (H) 4.8 - 5.6 %   Est. average glucose Bld gHb Est-mCnc 120 mg/dL      Assessment & Plan:   Problem List Items Addressed This Visit      Other   Hyperlipidemia    Chronic, ongoing.  No current medications as ASCVD 2.4%.  Continue diet focus.  Lipid panel today.      Relevant Orders   Lipid Panel w/o Chol/HDL Ratio   Bipolar 2 disorder (HCC) - Primary    Chronic, stable.  Denies SI/HI.  Has been stable on current regimen for years.  Continue current medication regimen and adjust as needed.  Refills sent.      Relevant Medications   buPROPion (WELLBUTRIN) 100 MG tablet   Other Relevant Orders   Basic metabolic panel   Obesity (BMI 35.0-39.9 without comorbidity)    Recommended eating smaller high protein, low fat meals more frequently and exercising 30 mins a day 5 times a week with a goal of 10-15lb weight loss in the next 3 months. Patient voiced their understanding and motivation to adhere to these recommendations.       Prediabetes    Ongoing, continues to focus on diet changes.  Will recheck A1C today and recommend continued focus on diabetic diet.  Initiate  medication as needed.      Relevant Orders   HgB A1c       Follow  up plan: Return in about 6 months (around 01/13/2020) for Annual physical.

## 2019-07-14 NOTE — Patient Instructions (Signed)
Preventing High Cholesterol Cholesterol is a white, waxy substance similar to fat that the human body needs to help build cells. The liver makes all the cholesterol that a person's body needs. Having high cholesterol (hypercholesterolemia) increases a person's risk for heart disease and stroke. Extra (excess) cholesterol comes from the food the person eats. High cholesterol can often be prevented with diet and lifestyle changes. If you already have high cholesterol, you can control it with diet and lifestyle changes and with medicine. How can high cholesterol affect me? If you have high cholesterol, deposits (plaques) may build up on the walls of your arteries. The arteries are the blood vessels that carry blood away from your heart. Plaques make the arteries narrower and stiffer. This can limit or block blood flow and cause blood clots to form. Blood clots:  Are tiny balls of cells that form in your blood.  Can move to the heart or brain, causing a heart attack or stroke. Plaques in arteries greatly increase your risk for heart attack and stroke.Making diet and lifestyle changes can reduce your risk for these conditions that may threaten your life. What can increase my risk? This condition is more likely to develop in people who:  Eat foods that are high in saturated fat or cholesterol. Saturated fat is mostly found in: ? Foods that contain animal fat, such as red meat and some dairy products. ? Certain fatty foods made from plants, such as tropical oils.  Are overweight.  Are not getting enough exercise.  Have a family history of high cholesterol. What actions can I take to prevent this? Nutrition   Eat less saturated fat.  Avoid trans fats (partially hydrogenated oils). These are often found in margarine and in some baked goods, fried foods, and snacks bought in packages.  Avoid precooked or cured meat, such as sausages or meat loaves.  Avoid foods and drinks that have added  sugars.  Eat more fruits, vegetables, and whole grains.  Choose healthy sources of protein, such as fish, poultry, lean cuts of red meat, beans, peas, lentils, and nuts.  Choose healthy sources of fat, such as: ? Nuts. ? Vegetable oils, especially olive oil. ? Fish that have healthy fats (omega-3 fatty acids), such as mackerel or salmon. The items listed above may not be a complete list of recommended foods and beverages. Contact a dietitian for more information. Lifestyle  Lose weight if you are overweight. Losing 5-10 lb (2.3-4.5 kg) can help prevent or control high cholesterol. It can also lower your risk for diabetes and high blood pressure. Ask your health care provider to help you with a diet and exercise plan to lose weight safely.  Do not use any products that contain nicotine or tobacco, such as cigarettes, e-cigarettes, and chewing tobacco. If you need help quitting, ask your health care provider.  Limit your alcohol intake. ? Do not drink alcohol if:  Your health care provider tells you not to drink.  You are pregnant, may be pregnant, or are planning to become pregnant. ? If you drink alcohol:  Limit how much you use to:  0-1 drink a day for women.  0-2 drinks a day for men.  Be aware of how much alcohol is in your drink. In the [place] one drink equals one 12 oz bottle of beer (355 mL), one 5 oz glass of wine (148 mL), or one 1 oz glass of hard liquor (44 mL). Activity   Get enough exercise. Each week, do at   least 150 minutes of exercise that takes a medium level of effort (moderate-intensity exercise). ? This is exercise that:  Makes your heart beat faster and makes you breathe harder than usual.  Allows you to still be able to talk. ? You could exercise in short sessions several times a day or longer sessions a few times a week. For example, on 5 days each week, you could walk fast or ride your bike 3 times a day for 10 minutes each time.  Do exercises as told  by your health care provider. Medicines  In addition to diet and lifestyle changes, your health care provider may recommend medicines to help lower cholesterol. This may be a medicine to lower the amount of cholesterol your liver makes. You may need medicine if: ? Diet and lifestyle changes do not lower your cholesterol enough. ? You have high cholesterol and other risk factors for heart disease or stroke.  Take over-the-counter and prescription medicines only as told by your health care provider. General information  Manage your risk factors for high cholesterol. Talk with your health care provider about all your risk factors and how to lower your risk.  Manage other conditions that you have, such as diabetes or high blood pressure (hypertension).  Have blood tests to check your cholesterol levels at regular points in time as told by your health care provider.  Keep all follow-up visits as told by your health care provider. This is important. Where to find more information  American Heart Association: www.heart.org  National Heart, Lung, and Blood Institute: www.nhlbi.nih.gov Summary  High cholesterol increases your risk for heart disease and stroke. By keeping your cholesterol level low, you can reduce your risk for these conditions.  High cholesterol can often be prevented with diet and lifestyle changes.  Work with your health care provider to manage your risk factors, and have your blood tested regularly. This information is not intended to replace advice given to you by your health care provider. Make sure you discuss any questions you have with your health care provider. Document Revised: 07/04/2018 Document Reviewed: 11/19/2015 Elsevier Patient Education  2020 [employer]  

## 2019-07-14 NOTE — Assessment & Plan Note (Signed)
Chronic, stable.  Denies SI/HI.  Has been stable on current regimen for years.  Continue current medication regimen and adjust as needed.  Refills sent.

## 2019-07-14 NOTE — Assessment & Plan Note (Signed)
Recommended eating smaller high protein, low fat meals more frequently and exercising 30 mins a day 5 times a week with a goal of 10-15lb weight loss in the next 3 months. Patient voiced their understanding and motivation to adhere to these recommendations.  

## 2019-07-14 NOTE — Assessment & Plan Note (Signed)
Ongoing, continues to focus on diet changes.  Will recheck A1C today and recommend continued focus on diabetic diet.  Initiate medication as needed.

## 2019-07-14 NOTE — Assessment & Plan Note (Signed)
Chronic, ongoing.  No current medications as ASCVD 2.4%.  Continue diet focus.  Lipid panel today.

## 2019-07-15 LAB — BASIC METABOLIC PANEL
BUN/Creatinine Ratio: 12 (ref 9–23)
BUN: 9 mg/dL (ref 6–24)
CO2: 23 mmol/L (ref 20–29)
Calcium: 9.9 mg/dL (ref 8.7–10.2)
Chloride: 101 mmol/L (ref 96–106)
Creatinine, Ser: 0.74 mg/dL (ref 0.57–1.00)
GFR calc Af Amer: 107 mL/min/{1.73_m2} (ref >59)
GFR calc non Af Amer: 93 mL/min/{1.73_m2} (ref >59)
Glucose: 79 mg/dL (ref 65–99)
Potassium: 4.4 mmol/L (ref 3.5–5.2)
Sodium: 139 mmol/L (ref 134–144)

## 2019-07-15 LAB — HEMOGLOBIN A1C
Est. average glucose Bld gHb Est-mCnc: 120 mg/dL
Hgb A1c MFr Bld: 5.8 % — ABNORMAL HIGH (ref 4.8–5.6)

## 2019-07-15 LAB — LIPID PANEL W/O CHOL/HDL RATIO
Cholesterol, Total: 233 mg/dL — ABNORMAL HIGH (ref 100–199)
HDL: 51 mg/dL (ref >39)
LDL Chol Calc (NIH): 168 mg/dL — ABNORMAL HIGH (ref 0–99)
Triglycerides: 81 mg/dL (ref 0–149)
VLDL Cholesterol Cal: 14 mg/dL (ref 5–40)

## 2019-07-15 NOTE — Progress Notes (Signed)
Contacted via MyChart The 10-year ASCVD risk score Mikey Bussing DC Jr., et al., 2013) is: 2.6%   Values used to calculate the score:     Age: 54 years     Sex: Female     Is Non-Hispanic African American: Yes     Diabetic: No     Tobacco smoker: No     Systolic Blood Pressure: 123456 mmHg     Is BP treated: No     HDL Cholesterol: 51 mg/dL     Total Cholesterol: 233 mg/dL

## 2019-07-16 ENCOUNTER — Encounter: Payer: Self-pay | Admitting: Nurse Practitioner

## 2019-07-21 DIAGNOSIS — M25562 Pain in left knee: Secondary | ICD-10-CM | POA: Diagnosis not present

## 2019-07-21 DIAGNOSIS — M25662 Stiffness of left knee, not elsewhere classified: Secondary | ICD-10-CM | POA: Diagnosis not present

## 2019-07-28 DIAGNOSIS — Z01818 Encounter for other preprocedural examination: Secondary | ICD-10-CM | POA: Diagnosis not present

## 2019-07-28 DIAGNOSIS — Z01812 Encounter for preprocedural laboratory examination: Secondary | ICD-10-CM | POA: Diagnosis not present

## 2019-07-28 DIAGNOSIS — M1712 Unilateral primary osteoarthritis, left knee: Secondary | ICD-10-CM | POA: Diagnosis not present

## 2019-07-28 DIAGNOSIS — Z20822 Contact with and (suspected) exposure to covid-19: Secondary | ICD-10-CM | POA: Diagnosis not present

## 2019-07-29 DIAGNOSIS — M1712 Unilateral primary osteoarthritis, left knee: Secondary | ICD-10-CM | POA: Diagnosis not present

## 2019-07-30 DIAGNOSIS — M25562 Pain in left knee: Secondary | ICD-10-CM | POA: Diagnosis not present

## 2019-07-30 DIAGNOSIS — M25762 Osteophyte, left knee: Secondary | ICD-10-CM | POA: Diagnosis not present

## 2019-07-30 DIAGNOSIS — E785 Hyperlipidemia, unspecified: Secondary | ICD-10-CM | POA: Diagnosis not present

## 2019-07-30 DIAGNOSIS — F329 Major depressive disorder, single episode, unspecified: Secondary | ICD-10-CM | POA: Diagnosis not present

## 2019-07-30 DIAGNOSIS — Z79899 Other long term (current) drug therapy: Secondary | ICD-10-CM | POA: Diagnosis not present

## 2019-07-30 DIAGNOSIS — D649 Anemia, unspecified: Secondary | ICD-10-CM | POA: Diagnosis not present

## 2019-07-30 DIAGNOSIS — G8918 Other acute postprocedural pain: Secondary | ICD-10-CM | POA: Diagnosis not present

## 2019-07-30 DIAGNOSIS — M1712 Unilateral primary osteoarthritis, left knee: Secondary | ICD-10-CM | POA: Diagnosis not present

## 2019-07-30 DIAGNOSIS — F419 Anxiety disorder, unspecified: Secondary | ICD-10-CM | POA: Diagnosis not present

## 2019-07-30 DIAGNOSIS — Z791 Long term (current) use of non-steroidal anti-inflammatories (NSAID): Secondary | ICD-10-CM | POA: Diagnosis not present

## 2019-07-30 DIAGNOSIS — Z91018 Allergy to other foods: Secondary | ICD-10-CM | POA: Diagnosis not present

## 2019-07-31 DIAGNOSIS — E785 Hyperlipidemia, unspecified: Secondary | ICD-10-CM | POA: Diagnosis not present

## 2019-07-31 DIAGNOSIS — M25762 Osteophyte, left knee: Secondary | ICD-10-CM | POA: Diagnosis not present

## 2019-07-31 DIAGNOSIS — F329 Major depressive disorder, single episode, unspecified: Secondary | ICD-10-CM | POA: Diagnosis not present

## 2019-07-31 DIAGNOSIS — M1712 Unilateral primary osteoarthritis, left knee: Secondary | ICD-10-CM | POA: Diagnosis not present

## 2019-07-31 DIAGNOSIS — Z79899 Other long term (current) drug therapy: Secondary | ICD-10-CM | POA: Diagnosis not present

## 2019-07-31 DIAGNOSIS — F419 Anxiety disorder, unspecified: Secondary | ICD-10-CM | POA: Diagnosis not present

## 2019-07-31 DIAGNOSIS — D649 Anemia, unspecified: Secondary | ICD-10-CM | POA: Diagnosis not present

## 2019-07-31 DIAGNOSIS — Z791 Long term (current) use of non-steroidal anti-inflammatories (NSAID): Secondary | ICD-10-CM | POA: Diagnosis not present

## 2019-07-31 DIAGNOSIS — Z91018 Allergy to other foods: Secondary | ICD-10-CM | POA: Diagnosis not present

## 2019-08-06 DIAGNOSIS — M25562 Pain in left knee: Secondary | ICD-10-CM | POA: Diagnosis not present

## 2019-08-06 DIAGNOSIS — Z96652 Presence of left artificial knee joint: Secondary | ICD-10-CM | POA: Diagnosis not present

## 2019-08-06 DIAGNOSIS — M25662 Stiffness of left knee, not elsewhere classified: Secondary | ICD-10-CM | POA: Diagnosis not present

## 2019-08-11 DIAGNOSIS — M25662 Stiffness of left knee, not elsewhere classified: Secondary | ICD-10-CM | POA: Diagnosis not present

## 2019-08-11 DIAGNOSIS — Z96652 Presence of left artificial knee joint: Secondary | ICD-10-CM | POA: Diagnosis not present

## 2019-08-11 DIAGNOSIS — M25562 Pain in left knee: Secondary | ICD-10-CM | POA: Diagnosis not present

## 2019-08-13 DIAGNOSIS — Z96652 Presence of left artificial knee joint: Secondary | ICD-10-CM | POA: Diagnosis not present

## 2019-08-13 DIAGNOSIS — M25662 Stiffness of left knee, not elsewhere classified: Secondary | ICD-10-CM | POA: Diagnosis not present

## 2019-08-13 DIAGNOSIS — M25562 Pain in left knee: Secondary | ICD-10-CM | POA: Diagnosis not present

## 2019-08-19 DIAGNOSIS — Z96652 Presence of left artificial knee joint: Secondary | ICD-10-CM | POA: Diagnosis not present

## 2019-08-19 DIAGNOSIS — M25562 Pain in left knee: Secondary | ICD-10-CM | POA: Diagnosis not present

## 2019-08-19 DIAGNOSIS — M25662 Stiffness of left knee, not elsewhere classified: Secondary | ICD-10-CM | POA: Diagnosis not present

## 2019-08-21 DIAGNOSIS — M25662 Stiffness of left knee, not elsewhere classified: Secondary | ICD-10-CM | POA: Diagnosis not present

## 2019-08-21 DIAGNOSIS — M25562 Pain in left knee: Secondary | ICD-10-CM | POA: Diagnosis not present

## 2019-08-21 DIAGNOSIS — Z96652 Presence of left artificial knee joint: Secondary | ICD-10-CM | POA: Diagnosis not present

## 2019-08-26 DIAGNOSIS — M25562 Pain in left knee: Secondary | ICD-10-CM | POA: Diagnosis not present

## 2019-08-26 DIAGNOSIS — Z96652 Presence of left artificial knee joint: Secondary | ICD-10-CM | POA: Diagnosis not present

## 2019-08-26 DIAGNOSIS — M25662 Stiffness of left knee, not elsewhere classified: Secondary | ICD-10-CM | POA: Diagnosis not present

## 2019-09-02 DIAGNOSIS — M25662 Stiffness of left knee, not elsewhere classified: Secondary | ICD-10-CM | POA: Diagnosis not present

## 2019-09-02 DIAGNOSIS — Z96652 Presence of left artificial knee joint: Secondary | ICD-10-CM | POA: Diagnosis not present

## 2019-09-02 DIAGNOSIS — M25562 Pain in left knee: Secondary | ICD-10-CM | POA: Diagnosis not present

## 2019-09-04 DIAGNOSIS — Z96652 Presence of left artificial knee joint: Secondary | ICD-10-CM | POA: Diagnosis not present

## 2019-09-04 DIAGNOSIS — M25562 Pain in left knee: Secondary | ICD-10-CM | POA: Diagnosis not present

## 2019-09-04 DIAGNOSIS — M25662 Stiffness of left knee, not elsewhere classified: Secondary | ICD-10-CM | POA: Diagnosis not present

## 2019-09-07 DIAGNOSIS — M25562 Pain in left knee: Secondary | ICD-10-CM | POA: Diagnosis not present

## 2019-09-07 DIAGNOSIS — M25662 Stiffness of left knee, not elsewhere classified: Secondary | ICD-10-CM | POA: Diagnosis not present

## 2019-09-07 DIAGNOSIS — Z96652 Presence of left artificial knee joint: Secondary | ICD-10-CM | POA: Diagnosis not present

## 2019-09-08 DIAGNOSIS — Z96652 Presence of left artificial knee joint: Secondary | ICD-10-CM | POA: Diagnosis not present

## 2019-09-11 DIAGNOSIS — Z96652 Presence of left artificial knee joint: Secondary | ICD-10-CM | POA: Diagnosis not present

## 2019-09-11 DIAGNOSIS — M25662 Stiffness of left knee, not elsewhere classified: Secondary | ICD-10-CM | POA: Diagnosis not present

## 2019-09-11 DIAGNOSIS — M25562 Pain in left knee: Secondary | ICD-10-CM | POA: Diagnosis not present

## 2019-09-14 DIAGNOSIS — Z96652 Presence of left artificial knee joint: Secondary | ICD-10-CM | POA: Diagnosis not present

## 2019-09-14 DIAGNOSIS — M25662 Stiffness of left knee, not elsewhere classified: Secondary | ICD-10-CM | POA: Diagnosis not present

## 2019-09-14 DIAGNOSIS — M25562 Pain in left knee: Secondary | ICD-10-CM | POA: Diagnosis not present

## 2019-09-17 DIAGNOSIS — M25662 Stiffness of left knee, not elsewhere classified: Secondary | ICD-10-CM | POA: Diagnosis not present

## 2019-09-17 DIAGNOSIS — Z96652 Presence of left artificial knee joint: Secondary | ICD-10-CM | POA: Diagnosis not present

## 2019-09-17 DIAGNOSIS — M25562 Pain in left knee: Secondary | ICD-10-CM | POA: Diagnosis not present

## 2019-09-21 DIAGNOSIS — M25662 Stiffness of left knee, not elsewhere classified: Secondary | ICD-10-CM | POA: Diagnosis not present

## 2019-09-21 DIAGNOSIS — Z96652 Presence of left artificial knee joint: Secondary | ICD-10-CM | POA: Diagnosis not present

## 2019-09-21 DIAGNOSIS — M25562 Pain in left knee: Secondary | ICD-10-CM | POA: Diagnosis not present

## 2019-09-24 DIAGNOSIS — M25562 Pain in left knee: Secondary | ICD-10-CM | POA: Diagnosis not present

## 2019-09-24 DIAGNOSIS — Z96652 Presence of left artificial knee joint: Secondary | ICD-10-CM | POA: Diagnosis not present

## 2019-09-24 DIAGNOSIS — M25662 Stiffness of left knee, not elsewhere classified: Secondary | ICD-10-CM | POA: Diagnosis not present

## 2019-09-30 DIAGNOSIS — M25662 Stiffness of left knee, not elsewhere classified: Secondary | ICD-10-CM | POA: Diagnosis not present

## 2019-09-30 DIAGNOSIS — M25562 Pain in left knee: Secondary | ICD-10-CM | POA: Diagnosis not present

## 2019-09-30 DIAGNOSIS — Z96652 Presence of left artificial knee joint: Secondary | ICD-10-CM | POA: Diagnosis not present

## 2019-10-02 DIAGNOSIS — M25662 Stiffness of left knee, not elsewhere classified: Secondary | ICD-10-CM | POA: Diagnosis not present

## 2019-10-02 DIAGNOSIS — Z96652 Presence of left artificial knee joint: Secondary | ICD-10-CM | POA: Diagnosis not present

## 2019-10-02 DIAGNOSIS — M25562 Pain in left knee: Secondary | ICD-10-CM | POA: Diagnosis not present

## 2019-10-07 DIAGNOSIS — Z96652 Presence of left artificial knee joint: Secondary | ICD-10-CM | POA: Diagnosis not present

## 2019-10-07 DIAGNOSIS — M25562 Pain in left knee: Secondary | ICD-10-CM | POA: Diagnosis not present

## 2019-10-07 DIAGNOSIS — M25662 Stiffness of left knee, not elsewhere classified: Secondary | ICD-10-CM | POA: Diagnosis not present

## 2019-10-15 DIAGNOSIS — M25662 Stiffness of left knee, not elsewhere classified: Secondary | ICD-10-CM | POA: Diagnosis not present

## 2019-10-15 DIAGNOSIS — M25562 Pain in left knee: Secondary | ICD-10-CM | POA: Diagnosis not present

## 2019-10-15 DIAGNOSIS — Z96652 Presence of left artificial knee joint: Secondary | ICD-10-CM | POA: Diagnosis not present

## 2019-10-15 DIAGNOSIS — R600 Localized edema: Secondary | ICD-10-CM | POA: Diagnosis not present

## 2019-10-20 DIAGNOSIS — M25562 Pain in left knee: Secondary | ICD-10-CM | POA: Diagnosis not present

## 2019-10-20 DIAGNOSIS — M25662 Stiffness of left knee, not elsewhere classified: Secondary | ICD-10-CM | POA: Diagnosis not present

## 2019-10-20 DIAGNOSIS — Z96652 Presence of left artificial knee joint: Secondary | ICD-10-CM | POA: Diagnosis not present

## 2020-02-25 ENCOUNTER — Other Ambulatory Visit: Payer: Self-pay | Admitting: Nurse Practitioner

## 2020-02-25 DIAGNOSIS — Z1231 Encounter for screening mammogram for malignant neoplasm of breast: Secondary | ICD-10-CM

## 2020-03-19 ENCOUNTER — Encounter: Payer: Self-pay | Admitting: Nurse Practitioner

## 2020-03-21 ENCOUNTER — Ambulatory Visit (INDEPENDENT_AMBULATORY_CARE_PROVIDER_SITE_OTHER): Payer: BC Managed Care – PPO | Admitting: Nurse Practitioner

## 2020-03-21 ENCOUNTER — Encounter: Payer: Self-pay | Admitting: Nurse Practitioner

## 2020-03-21 ENCOUNTER — Other Ambulatory Visit: Payer: Self-pay

## 2020-03-21 VITALS — BP 126/80 | HR 54 | Temp 97.6°F | Ht 67.48 in | Wt 240.4 lb

## 2020-03-21 DIAGNOSIS — Z131 Encounter for screening for diabetes mellitus: Secondary | ICD-10-CM | POA: Diagnosis not present

## 2020-03-21 DIAGNOSIS — Z0000 Encounter for general adult medical examination without abnormal findings: Secondary | ICD-10-CM | POA: Diagnosis not present

## 2020-03-21 DIAGNOSIS — Z13220 Encounter for screening for lipoid disorders: Secondary | ICD-10-CM | POA: Diagnosis not present

## 2020-03-21 DIAGNOSIS — Z1329 Encounter for screening for other suspected endocrine disorder: Secondary | ICD-10-CM | POA: Diagnosis not present

## 2020-03-21 DIAGNOSIS — F3181 Bipolar II disorder: Secondary | ICD-10-CM

## 2020-03-21 DIAGNOSIS — Z1159 Encounter for screening for other viral diseases: Secondary | ICD-10-CM

## 2020-03-21 MED ORDER — BUPROPION HCL 100 MG PO TABS: 100.0000 mg | ORAL_TABLET | Freq: Every day | ORAL | 4 refills | Status: DC

## 2020-03-21 MED ORDER — RISPERIDONE 3 MG PO TABS
3.0000 mg | ORAL_TABLET | Freq: Every day | ORAL | 4 refills | Status: DC
Start: 2020-03-21 — End: 2021-04-13

## 2020-03-21 NOTE — Patient Instructions (Addendum)
Prediabetes Eating Plan Prediabetes is a condition that causes blood sugar (glucose) levels to be higher than normal. This increases the risk for developing diabetes. In order to prevent diabetes from developing, your health care provider may recommend a diet and other lifestyle changes to help you:  Control your blood glucose levels.  Improve your cholesterol levels.  Manage your blood pressure. Your health care provider may recommend working with a diet and nutrition specialist (dietitian) to make a meal plan that is best for you. What are tips for following this plan? Lifestyle  Set weight loss goals with the help of your health care team. It is recommended that most people with prediabetes lose 7% of their current body weight.  Exercise for at least 30 minutes at least 5 days a week.  Attend a support group or seek ongoing support from a mental health counselor.  Take over-the-counter and prescription medicines only as told by your health care provider. Reading food labels  Read food labels to check the amount of fat, salt (sodium), and sugar in prepackaged foods. Avoid foods that have: ? Saturated fats. ? Trans fats. ? Added sugars.  Avoid foods that have more than 300 milligrams (mg) of sodium per serving. Limit your daily sodium intake to less than 2,300 mg each day. Shopping  Avoid buying pre-made and processed foods. Cooking  Cook with olive oil. Do not use butter, lard, or ghee.  Bake, broil, grill, or boil foods. Avoid frying. Meal planning   Work with your dietitian to develop an eating plan that is right for you. This may include: ? Tracking how many calories you take in. Use a food diary, notebook, or mobile application to track what you eat at each meal. ? Using the glycemic index (GI) to plan your meals. The index tells you how quickly a food will raise your blood glucose. Choose low-GI foods. These foods take a longer time to raise blood glucose.  Consider  following a Mediterranean diet. This diet includes: ? Several servings each day of fresh fruits and vegetables. ? Eating fish at least twice a week. ? Several servings each day of whole grains, beans, nuts, and seeds. ? Using olive oil instead of other fats. ? Moderate alcohol consumption. ? Eating small amounts of red meat and whole-fat dairy.  If you have high blood pressure, you may need to limit your sodium intake or follow a diet such as the DASH eating plan. DASH is an eating plan that aims to lower high blood pressure. What foods are recommended? The items listed below may not be a complete list. Talk with your dietitian about what dietary choices are best for you. Grains Whole grains, such as whole-wheat or whole-grain breads, crackers, cereals, and pasta. Unsweetened oatmeal. Bulgur. Barley. Quinoa. Brown rice. Corn or whole-wheat flour tortillas or taco shells. Vegetables Lettuce. Spinach. Peas. Beets. Cauliflower. Cabbage. Broccoli. Carrots. Tomatoes. Squash. Eggplant. Herbs. Peppers. Onions. Cucumbers. Brussels sprouts. Fruits Berries. Bananas. Apples. Oranges. Grapes. Papaya. Mango. Pomegranate. Kiwi. Grapefruit. Cherries. Meats and other protein foods Seafood. Poultry without skin. Lean cuts of pork and beef. Tofu. Eggs. Nuts. Beans. Dairy Low-fat or fat-free dairy products, such as yogurt, cottage cheese, and cheese. Beverages Water. Tea. Coffee. Sugar-free or diet soda. Seltzer water. Lowfat or no-fat milk. Milk alternatives, such as soy or almond milk. Fats and oils Olive oil. Canola oil. Sunflower oil. Grapeseed oil. Avocado. Walnuts. Sweets and desserts Sugar-free or low-fat pudding. Sugar-free or low-fat ice cream and other frozen treats.  Seasoning and other foods Herbs. Sodium-free spices. Mustard. Relish. Low-fat, low-sugar ketchup. Low-fat, low-sugar barbecue sauce. Low-fat or fat-free mayonnaise. What foods are not recommended? The items listed below may not be a  complete list. Talk with your dietitian about what dietary choices are best for you. Grains Refined white flour and flour products, such as bread, pasta, snack foods, and cereals. Vegetables Canned vegetables. Frozen vegetables with butter or cream sauce. Fruits Fruits canned with syrup. Meats and other protein foods Fatty cuts of meat. Poultry with skin. Breaded or fried meat. Processed meats. Dairy Full-fat yogurt, cheese, or milk. Beverages Sweetened drinks, such as sweet iced tea and soda. Fats and oils Butter. Lard. Ghee. Sweets and desserts Baked goods, such as cake, cupcakes, pastries, cookies, and cheesecake. Seasoning and other foods Spice mixes with added salt. Ketchup. Barbecue sauce. Mayonnaise. Summary  To prevent diabetes from developing, you may need to make diet and other lifestyle changes to help control blood sugar, improve cholesterol levels, and manage your blood pressure.  Set weight loss goals with the help of your health care team. It is recommended that most people with prediabetes lose 7 percent of their current body weight.  Consider following a Mediterranean diet that includes plenty of fresh fruits and vegetables, whole grains, beans, nuts, seeds, fish, lean meat, low-fat dairy, and healthy oils. This information is not intended to replace advice given to you by your health care provider. Make sure you discuss any questions you have with your health care provider. Document Revised: 07/04/2018 Document Reviewed: 05/16/2016 Elsevier Patient Education  2020 [place] Following a healthy eating pattern may help you to achieve and maintain a healthy body weight, reduce the risk of chronic disease, and live a long and productive life. It is important to follow a healthy eating pattern at an appropriate calorie level for your body. Your nutritional needs should be met primarily through food by choosing a variety of nutrient-rich  foods. What are tips for following this plan? Reading food labels  Read labels and choose the following: ? Reduced or low sodium. ? Juices with 100% fruit juice. ? Foods with low saturated fats and high polyunsaturated and monounsaturated fats. ? Foods with whole grains, such as whole wheat, cracked wheat, brown rice, and wild rice. ? Whole grains that are fortified with folic acid. This is recommended for women who are pregnant or who want to become pregnant.  Read labels and avoid the following: ? Foods with a lot of added sugars. These include foods that contain brown sugar, corn sweetener, corn syrup, dextrose, fructose, glucose, high-fructose corn syrup, honey, invert sugar, lactose, malt syrup, maltose, molasses, raw sugar, sucrose, trehalose, or turbinado sugar.  Do not eat more than the following amounts of added sugar per day:  6 teaspoons (25 g) for women.  9 teaspoons (38 g) for men. ? Foods that contain processed or refined starches and grains. ? Refined grain products, such as white flour, degermed cornmeal, white bread, and white rice. Shopping  Choose nutrient-rich snacks, such as vegetables, whole fruits, and nuts. Avoid high-calorie and high-sugar snacks, such as potato chips, fruit snacks, and candy.  Use oil-based dressings and spreads on foods instead of solid fats such as butter, stick margarine, or cream cheese.  Limit pre-made sauces, mixes, and "instant" products such as flavored rice, instant noodles, and ready-made pasta.  Try more plant-protein sources, such as tofu, tempeh, black beans, edamame, lentils, nuts, and seeds.  Explore eating  plans such as the Mediterranean diet or vegetarian diet. Cooking  Use oil to saut or stir-fry foods instead of solid fats such as butter, stick margarine, or lard.  Try baking, boiling, grilling, or broiling instead of frying.  Remove the fatty part of meats before cooking.  Steam vegetables in water or broth. Meal  planning   At meals, imagine dividing your plate into fourths: ? One-half of your plate is fruits and vegetables. ? One-fourth of your plate is whole grains. ? One-fourth of your plate is protein, especially lean meats, poultry, eggs, tofu, beans, or nuts.  Include low-fat dairy as part of your daily diet. Lifestyle  Choose healthy options in all settings, including home, work, school, restaurants, or stores.  Prepare your food safely: ? Wash your hands after handling raw meats. ? Keep food preparation surfaces clean by regularly washing with hot, soapy water. ? Keep raw meats separate from ready-to-eat foods, such as fruits and vegetables. ? Cook seafood, meat, poultry, and eggs to the recommended internal temperature. ? Store foods at safe temperatures. In general:  Keep cold foods at 78F (4.4C) or below.  Keep hot foods at 178F (60C) or above.  Keep your freezer at [facility] (-17.8C) or below.  Foods are no longer safe to eat when they have been between the temperatures of 40-178F (4.4-60C) for more than 2 hours. What foods should I eat? Fruits Aim to eat 2 cup-equivalents of fresh, canned (in natural juice), or frozen fruits each day. Examples of 1 cup-equivalent of fruit include 1 small apple, 8 large strawberries, 1 cup canned fruit,  cup dried fruit, or 1 cup 100% juice. Vegetables Aim to eat 2-3 cup-equivalents of fresh and frozen vegetables each day, including different varieties and colors. Examples of 1 cup-equivalent of vegetables include 2 medium carrots, 2 cups raw, leafy greens, 1 cup chopped vegetable (raw or cooked), or 1 medium baked potato. Grains Aim to eat 6 ounce-equivalents of whole grains each day. Examples of 1 ounce-equivalent of grains include 1 slice of bread, 1 cup ready-to-eat cereal, 3 cups popcorn, or  cup cooked rice, pasta, or cereal. Meats and other proteins Aim to eat 5-6 ounce-equivalents of protein each day. Examples of 1 ounce-equivalent  of protein include 1 egg, 1/2 cup nuts or seeds, or 1 tablespoon (16 g) peanut butter. A cut of meat or fish that is the size of a deck of cards is about 3-4 ounce-equivalents.  Of the protein you eat each week, try to have at least 8 ounces come from seafood. This includes salmon, trout, herring, and anchovies. Dairy Aim to eat 3 cup-equivalents of fat-free or low-fat dairy each day. Examples of 1 cup-equivalent of dairy include 1 cup (240 mL) milk, 8 ounces (250 g) yogurt, 1 ounces (44 g) natural cheese, or 1 cup (240 mL) fortified soy milk. Fats and oils  Aim for about 5 teaspoons (21 g) per day. Choose monounsaturated fats, such as canola and olive oils, avocados, peanut butter, and most nuts, or polyunsaturated fats, such as sunflower, corn, and soybean oils, walnuts, pine nuts, sesame seeds, sunflower seeds, and flaxseed. Beverages  Aim for six 8-oz glasses of water per day. Limit coffee to three to five 8-oz cups per day.  Limit caffeinated beverages that have added calories, such as soda and energy drinks.  Limit alcohol intake to no more than 1 drink a day for nonpregnant women and 2 drinks a day for men. One drink equals 12 oz of beer (355  mL), 5 oz of wine (148 mL), or 1 oz of hard liquor (44 mL). Seasoning and other foods  Avoid adding excess amounts of salt to your foods. Try flavoring foods with herbs and spices instead of salt.  Avoid adding sugar to foods.  Try using oil-based dressings, sauces, and spreads instead of solid fats. This information is based on general [place] nutrition guidelines. For more information, visit [employer].es. Exact amounts may vary based on your nutrition needs. Summary  A healthy eating plan may help you to maintain a healthy weight, reduce the risk of chronic diseases, and stay active throughout your life.  Plan your meals. Make sure you eat the right portions of a variety of nutrient-rich foods.  Try baking, boiling, grilling, or  broiling instead of frying.  Choose healthy options in all settings, including home, work, school, restaurants, or stores. This information is not intended to replace advice given to you by your health care provider. Make sure you discuss any questions you have with your health care provider. Document Revised: 06/24/2017 Document Reviewed: 06/24/2017 Elsevier Patient Education  [place].

## 2020-03-21 NOTE — Progress Notes (Signed)
BP 126/80 (BP Location: Left Arm)   Pulse (!) 54   Temp 97.6 F (36.4 C)   Ht 5' 7.48" (1.714 m)   Wt 240 lb 6 oz (109 kg)   LMP  (LMP Unknown)   SpO2 98%   BMI 37.11 kg/m    Subjective:    Patient ID: Gabriella Hensley, female    DOB: 1965/04/18, 54 y.o.   MRN: [id number]  HPI: Gabriella Hensley is a 54 y.o. female presenting on 03/21/2020 for comprehensive medical examination. Current medical complaints include:none  She currently lives with: significant other Menopausal Symptoms: no  Depression Screen done today and results listed below:  Depression screen [facility] 2/9 07/14/2019 07/01/2018 12/30/2017 06/21/2017 12/19/2016  Decreased Interest 0 0 0 0 0  Down, Depressed, Hopeless 0 0 0 0 0  PHQ - 2 Score 0 0 0 0 0  Altered sleeping 0 0 0 0 0  Tired, decreased energy 0 0 0 0 0  Change in appetite 0 0 0 0 1  Feeling bad or failure about yourself  0 0 0 0 0  Trouble concentrating 0 1 0 0 0  Moving slowly or fidgety/restless 0 0 0 0 0  Suicidal thoughts 0 0 0 0 0  PHQ-9 Score 0 1 0 0 1  Difficult doing work/chores - Not difficult at all - - -    The patient does not have a history of falls. I did not complete a risk assessment for falls. A plan of care for falls was not documented.   Past Medical History:  Past Medical History:  Diagnosis Date  . Bipolar 1 disorder ([place])   . Depression   . Hypertension     Surgical History:  Past Surgical History:  Procedure Laterality Date  . ABDOMINAL HYSTERECTOMY    . APPENDECTOMY    . COLONOSCOPY WITH PROPOFOL N/A 02/14/2016   Procedure: COLONOSCOPY WITH PROPOFOL;  Surgeon: Lucilla Lame, MD;  Location: [facility] ENDOSCOPY;  Service: Endoscopy;  Laterality: N/A;  . JOINT REPLACEMENT Left    Partial     Medications:  Current Outpatient Medications on File Prior to Visit  Medication Sig  . meloxicam (MOBIC) 15 MG tablet meloxicam 15 mg tablet  TAKE 1 TABLET BY MOUTH ONCE DAILY   No current facility-administered medications on file prior to  visit.    Allergies:  Allergies  Allergen Reactions  . Mushroom Extract Complex     Social History:  Social History   Socioeconomic History  . Marital status: Married    Spouse name: Not on file  . Number of children: Not on file  . Years of education: Not on file  . Highest education level: Not on file  Occupational History  . Not on file  Tobacco Use  . Smoking status: Never Smoker  . Smokeless tobacco: Never Used  Vaping Use  . Vaping Use: Never used  Substance and Sexual Activity  . Alcohol use: No  . Drug use: No  . Sexual activity: Yes  Other Topics Concern  . Not on file  Social History Narrative  . Not on file   Social Determinants of Health   Financial Resource Strain: Not on file  Food Insecurity: Not on file  Transportation Needs: Not on file  Physical Activity: Not on file  Stress: Not on file  Social Connections: Not on file  Intimate Partner Violence: Not on file   Social History   Tobacco Use  Smoking Status Never Smoker  Smokeless Tobacco Never Used   Social History   Substance and Sexual Activity  Alcohol Use No    Family History:  Family History  Problem Relation Age of Onset  . Hypertension Mother   . Cancer Mother        ovarian  . Ovarian cancer Mother 33  . Hypertension Son   . Heart disease Maternal Grandfather   . Breast cancer Maternal Aunt 30    Past medical history, surgical history, medications, allergies, family history and social history reviewed with patient today and changes made to appropriate areas of the chart.   Review of Systems - negative All other ROS negative except what is listed above and in the HPI.      Objective:    BP 126/80 (BP Location: Left Arm)   Pulse (!) 54   Temp 97.6 F (36.4 C)   Ht 5' 7.48" (1.714 m)   Wt 240 lb 6 oz (109 kg)   LMP  (LMP Unknown)   SpO2 98%   BMI 37.11 kg/m   Wt Readings from Last 3 Encounters:  03/21/20 240 lb 6 oz (109 kg)  07/14/19 236 lb (107 kg)   04/23/19 240 lb (108.9 kg)    Physical Exam Vitals and nursing note reviewed.  Constitutional:      General: She is awake. She is not in acute distress.    Appearance: She is well-developed and well-groomed. She is obese. She is not ill-appearing.  HENT:     Head: Normocephalic and atraumatic.     Right Ear: Hearing, tympanic membrane, ear canal and external ear normal. No drainage.     Left Ear: Hearing, tympanic membrane, ear canal and external ear normal. No drainage.     Nose: Nose normal.     Right Sinus: No maxillary sinus tenderness or frontal sinus tenderness.     Left Sinus: No maxillary sinus tenderness or frontal sinus tenderness.     Mouth/Throat:     Mouth: Mucous membranes are moist.     Pharynx: Oropharynx is clear. Uvula midline. No pharyngeal swelling, oropharyngeal exudate or posterior oropharyngeal erythema.  Eyes:     General: Lids are normal.        Right eye: No discharge.        Left eye: No discharge.     Extraocular Movements: Extraocular movements intact.     Conjunctiva/sclera: Conjunctivae normal.     Pupils: Pupils are equal, round, and reactive to light.     Visual Fields: Right eye visual fields normal and left eye visual fields normal.  Neck:     Thyroid: No thyromegaly.     Vascular: No carotid bruit.     Trachea: Trachea normal.  Cardiovascular:     Rate and Rhythm: Normal rate and regular rhythm.     Heart sounds: Normal heart sounds. No murmur heard. No gallop.   Pulmonary:     Effort: Pulmonary effort is normal. No accessory muscle usage or respiratory distress.     Breath sounds: Normal breath sounds.  Chest:  Breasts:     Right: Normal. No axillary adenopathy or supraclavicular adenopathy.     Left: Normal. No axillary adenopathy or supraclavicular adenopathy.    Abdominal:     General: Bowel sounds are normal.     Palpations: Abdomen is soft. There is no hepatomegaly or splenomegaly.     Tenderness: There is no abdominal  tenderness.  Musculoskeletal:        General: Normal range  of motion.     Cervical back: Normal range of motion and neck supple.     Right lower leg: No edema.     Left lower leg: No edema.  Lymphadenopathy:     Head:     Right side of head: No submental, submandibular, tonsillar, preauricular or posterior auricular adenopathy.     Left side of head: No submental, submandibular, tonsillar, preauricular or posterior auricular adenopathy.     Cervical: No cervical adenopathy.     Upper Body:     Right upper body: No supraclavicular, axillary or pectoral adenopathy.     Left upper body: No supraclavicular, axillary or pectoral adenopathy.  Skin:    General: Skin is warm and dry.     Capillary Refill: Capillary refill takes less than 2 seconds.     Findings: No rash.  Neurological:     Mental Status: She is alert and oriented to person, place, and time.     Cranial Nerves: Cranial nerves are intact.     Gait: Gait is intact.     Deep Tendon Reflexes: Reflexes are normal and symmetric.     Reflex Scores:      Brachioradialis reflexes are 2+ on the right side and 2+ on the left side.      Patellar reflexes are 2+ on the right side and 2+ on the left side. Psychiatric:        Attention and Perception: Attention normal.        Mood and Affect: Mood normal.        Speech: Speech normal.        Behavior: Behavior normal. Behavior is cooperative.        Thought Content: Thought content normal.        Judgment: Judgment normal.    Results for orders placed or performed in visit on 07/14/19  Lipid Panel w/o Chol/HDL Ratio  Result Value Ref Range   Cholesterol, Total 233 (H) 100 - 199 mg/dL   Triglycerides 81 0 - 149 mg/dL   HDL 51 >39 mg/dL   VLDL Cholesterol Cal 14 5 - 40 mg/dL   LDL Chol Calc (NIH) 168 (H) 0 - 99 mg/dL  Basic metabolic panel  Result Value Ref Range   Glucose 79 65 - 99 mg/dL   BUN 9 6 - 24 mg/dL   Creatinine, Ser 0.74 0.57 - 1.00 mg/dL   GFR calc non Af Amer 93  >59 mL/min/1.73   GFR calc Af Amer 107 >59 mL/min/1.73   BUN/Creatinine Ratio 12 9 - 23   Sodium 139 134 - 144 mmol/L   Potassium 4.4 3.5 - 5.2 mmol/L   Chloride 101 96 - 106 mmol/L   CO2 23 20 - 29 mmol/L   Calcium 9.9 8.7 - 10.2 mg/dL  HgB A1c  Result Value Ref Range   Hgb A1c MFr Bld 5.8 (H) 4.8 - 5.6 %   Est. average glucose Bld gHb Est-mCnc 120 mg/dL      Assessment & Plan:   Problem List Items Addressed This Visit      Other   Bipolar 2 disorder ([place])   Relevant Medications   buPROPion (WELLBUTRIN) 100 MG tablet    Other Visit Diagnoses    Encounter for annual physical exam    -  Primary   Annual labs today to include CBC, CMP, TSH ,lipid, A1c   Relevant Orders   Comprehensive metabolic panel   CBC with Differential/Platelet   Diabetes mellitus screening  A1c on labs today   Relevant Orders   HgB A1c   Thyroid disorder screen       TSH on labs today   Relevant Orders   TSH   Screening cholesterol level       Lipid panel on labs today   Relevant Orders   Lipid Panel w/o Chol/HDL Ratio   Need for hepatitis C screening test       Hep C screening on labs today   Relevant Orders   Hepatitis C antibody       Follow up plan: Return in about 6 months (around 09/19/2020) for MOOD, A1C check.   LABORATORY TESTING:  - Pap smear: had hysterectomy  IMMUNIZATIONS:   - Tdap: Tetanus vaccination status reviewed: last tetanus booster within 10 years. - Influenza: Up to date - Pneumovax: Not applicable - Prevnar: Not applicable - HPV: Not applicable - Zostavax vaccine: is going to check with insurance - Covid vaccines -- did not obtain, does not want to  SCREENING: -Mammogram: to be performed 04/07/2020  - Colonoscopy: Up to date  - Bone Density: Not applicable  -Hearing Test: Not applicable  -Spirometry: Not applicable   PATIENT COUNSELING:   Advised to take 1 mg of folate supplement per day if capable of pregnancy.   Sexuality: Discussed sexually  transmitted diseases, partner selection, use of condoms, avoidance of unintended pregnancy  and contraceptive alternatives.   Advised to avoid cigarette smoking.  I discussed with the patient that most people either abstain from alcohol or drink within safe limits (<=14/week and <=4 drinks/occasion for males, <=7/weeks and <= 3 drinks/occasion for females) and that the risk for alcohol disorders and other health effects rises proportionally with the number of drinks per week and how often a drinker exceeds daily limits.  Discussed cessation/primary prevention of drug use and availability of treatment for abuse.   Diet: Encouraged to adjust caloric intake to maintain  or achieve ideal body weight, to reduce intake of dietary saturated fat and total fat, to limit sodium intake by avoiding high sodium foods and not adding table salt, and to maintain adequate dietary potassium and calcium preferably from fresh fruits, vegetables, and low-fat dairy products.    Stressed the importance of regular exercise  Injury prevention: Discussed safety belts, safety helmets, smoke detector, smoking near bedding or upholstery.   Dental health: Discussed importance of regular tooth brushing, flossing, and dental visits.    NEXT PREVENTATIVE PHYSICAL DUE IN 1 YEAR. Return in about 6 months (around 09/19/2020) for MOOD, A1C check.

## 2020-03-21 NOTE — Addendum Note (Signed)
Addended by: Aura Dials T on: 03/21/2020 01:56 PM   Modules accepted: Orders

## 2020-03-23 ENCOUNTER — Other Ambulatory Visit: Payer: Self-pay

## 2020-03-23 ENCOUNTER — Other Ambulatory Visit: Payer: BC Managed Care – PPO

## 2020-03-23 DIAGNOSIS — Z1329 Encounter for screening for other suspected endocrine disorder: Secondary | ICD-10-CM | POA: Diagnosis not present

## 2020-03-23 DIAGNOSIS — Z13220 Encounter for screening for lipoid disorders: Secondary | ICD-10-CM | POA: Diagnosis not present

## 2020-03-23 DIAGNOSIS — Z0000 Encounter for general adult medical examination without abnormal findings: Secondary | ICD-10-CM

## 2020-03-23 DIAGNOSIS — Z1159 Encounter for screening for other viral diseases: Secondary | ICD-10-CM | POA: Diagnosis not present

## 2020-03-23 DIAGNOSIS — Z131 Encounter for screening for diabetes mellitus: Secondary | ICD-10-CM | POA: Diagnosis not present

## 2020-03-24 LAB — LIPID PANEL W/O CHOL/HDL RATIO
Cholesterol, Total: 225 mg/dL — ABNORMAL HIGH (ref 100–199)
HDL: 47 mg/dL (ref >39)
LDL Chol Calc (NIH): 158 mg/dL — ABNORMAL HIGH (ref 0–99)
Triglycerides: 114 mg/dL (ref 0–149)
VLDL Cholesterol Cal: 20 mg/dL (ref 5–40)

## 2020-03-24 LAB — COMPREHENSIVE METABOLIC PANEL
ALT: 14 IU/L (ref 0–32)
AST: 15 IU/L (ref 0–40)
Albumin/Globulin Ratio: 1.6 (ref 1.2–2.2)
Albumin: 4.2 g/dL (ref 3.8–4.9)
Alkaline Phosphatase: 117 IU/L (ref 44–121)
BUN/Creatinine Ratio: 11 (ref 9–23)
BUN: 9 mg/dL (ref 6–24)
Bilirubin Total: 0.4 mg/dL (ref 0.0–1.2)
CO2: 22 mmol/L (ref 20–29)
Calcium: 9.2 mg/dL (ref 8.7–10.2)
Chloride: 104 mmol/L (ref 96–106)
Creatinine, Ser: 0.79 mg/dL (ref 0.57–1.00)
GFR calc Af Amer: 98 mL/min/{1.73_m2} (ref >59)
GFR calc non Af Amer: 85 mL/min/{1.73_m2} (ref >59)
Globulin, Total: 2.6 g/dL (ref 1.5–4.5)
Glucose: 101 mg/dL — ABNORMAL HIGH (ref 65–99)
Potassium: 4.4 mmol/L (ref 3.5–5.2)
Sodium: 140 mmol/L (ref 134–144)
Total Protein: 6.8 g/dL (ref 6.0–8.5)

## 2020-03-24 LAB — CBC WITH DIFFERENTIAL/PLATELET
Basophils Absolute: 0 10*3/uL (ref 0.0–0.2)
Basos: 0 %
EOS (ABSOLUTE): 0.1 10*3/uL (ref 0.0–0.4)
Eos: 2 %
Hematocrit: 38.2 % (ref 34.0–46.6)
Hemoglobin: 12.5 g/dL (ref 11.1–15.9)
Immature Grans (Abs): 0 10*3/uL (ref 0.0–0.1)
Immature Granulocytes: 0 %
Lymphocytes Absolute: 2.1 10*3/uL (ref 0.7–3.1)
Lymphs: 42 %
MCH: 28.3 pg (ref 26.6–33.0)
MCHC: 32.7 g/dL (ref 31.5–35.7)
MCV: 87 fL (ref 79–97)
Monocytes Absolute: 0.4 10*3/uL (ref 0.1–0.9)
Monocytes: 8 %
Neutrophils Absolute: 2.4 10*3/uL (ref 1.4–7.0)
Neutrophils: 48 %
Platelets: 227 10*3/uL (ref 150–450)
RBC: 4.41 x10E6/uL (ref 3.77–5.28)
RDW: 12.6 % (ref 11.7–15.4)
WBC: 5 10*3/uL (ref 3.4–10.8)

## 2020-03-24 LAB — HEMOGLOBIN A1C
Est. average glucose Bld gHb Est-mCnc: 131 mg/dL
Hgb A1c MFr Bld: 6.2 % — ABNORMAL HIGH (ref 4.8–5.6)

## 2020-03-24 LAB — HEPATITIS C ANTIBODY: Hep C Virus Ab: <0.1 s/co ratio (ref 0.0–0.9)

## 2020-03-24 LAB — TSH: TSH: 2.23 u[IU]/mL (ref 0.450–4.500)

## 2020-03-24 NOTE — Progress Notes (Signed)
Contacted via MyChart The 10-year ASCVD risk score Denman George DC Jr., et al., 2013) is: 3.6%   Values used to calculate the score:     Age: 54 years     Sex: Female     Is Non-Hispanic African American: Yes     Diabetic: No     Tobacco smoker: No     Systolic Blood Pressure: 126 mmHg     Is BP treated: No     HDL Cholesterol: 47 mg/dL     Total Cholesterol: 225 mg/dL   Good morning Gabriella Hensley, your labs have returned and I will walk through them with you. - TSH is thyroid lab and this is normal, no interventions needed - Hep C testing is negative:) - Kidney and liver function are normal on the CMP - CBC shows no anemia, all normal - Your A1c has trended up.  The A1C is the diabetes testing we talked about, this looks at your blood sugars over the past 3 months and turns the average into a number.  Your number is 6.2%, meaning you are prediabetic.  Any number 5.7 to 6.4 is considered prediabetes and any number 6.5 or greater is considered diabetes.   I would recommend heavy focus on decreasing foods high in sugar and your intake of things like bread products, pasta, and rice.  The American Diabetes Association online has a large amount of information on diet changes to make.  We will recheck this number in 3 months to ensure you are not continuing to trend upwards and move into diabetes.   - Your cholesterol is still high, but continued recommendations to make lifestyle changes. Your LDL is above normal. The LDL is the bad cholesterol. Over time and in combination with inflammation and other factors, this contributes to plaque which in turn may lead to stroke and/or heart attack down the road. Sometimes high LDL is primarily genetic, and people might be eating all the right foods but still have high numbers. Other times, there is room for improvement in one's diet and eating healthier can bring this number down and potentially reduce one's risk of heart attack and/or stroke.   To reduce your LDL,  Remember - more fruits and vegetables, more fish, and limit red meat and dairy products. More soy, nuts, beans, barley, lentils, oats and plant sterol ester enriched margarine instead of butter. I also encourage eliminating sugar and processed food. Remember, shop on the outside of the grocery store and visit your [employer]. If you would like to talk with me about dietary changes for your cholesterol, please let me know. We should recheck your cholesterol in 6 months. Any questions? Keep being awesome!!  Thank you for allowing me to participate in your care. Kindest regards, Gabriella Hensley

## 2020-04-07 ENCOUNTER — Other Ambulatory Visit: Payer: Self-pay

## 2020-04-07 ENCOUNTER — Ambulatory Visit
Admission: RE | Admit: 2020-04-07 | Discharge: 2020-04-07 | Disposition: A | Discharge status: Home / Self Care | Payer: BC Managed Care – PPO | Source: Ambulatory Visit | Attending: Nurse Practitioner | Admitting: Nurse Practitioner

## 2020-04-07 DIAGNOSIS — Z1231 Encounter for screening mammogram for malignant neoplasm of breast: Secondary | ICD-10-CM

## 2020-04-08 NOTE — Progress Notes (Signed)
Contacted via MyChart   Normal mammogram reading, can repeat in one year!!

## 2020-05-10 DIAGNOSIS — H524 Presbyopia: Secondary | ICD-10-CM | POA: Diagnosis not present

## 2020-05-24 HISTORY — PX: FOOT SURGERY: SHX648

## 2020-07-20 ENCOUNTER — Other Ambulatory Visit: Payer: Self-pay | Admitting: Nurse Practitioner

## 2020-07-20 DIAGNOSIS — F3181 Bipolar II disorder: Secondary | ICD-10-CM

## 2020-07-25 ENCOUNTER — Other Ambulatory Visit: Payer: Self-pay | Admitting: Nurse Practitioner

## 2020-07-25 DIAGNOSIS — F3181 Bipolar II disorder: Secondary | ICD-10-CM

## 2020-07-27 ENCOUNTER — Other Ambulatory Visit: Payer: Self-pay | Admitting: Nurse Practitioner

## 2020-07-27 DIAGNOSIS — F3181 Bipolar II disorder: Secondary | ICD-10-CM

## 2020-09-20 ENCOUNTER — Encounter: Payer: Self-pay | Admitting: Nurse Practitioner

## 2020-09-20 ENCOUNTER — Other Ambulatory Visit: Payer: Self-pay

## 2020-09-20 ENCOUNTER — Ambulatory Visit: Payer: BC Managed Care – PPO | Admitting: Nurse Practitioner

## 2020-09-20 VITALS — BP 114/74 | HR 63 | Temp 99.3°F | Wt 250.8 lb

## 2020-09-20 DIAGNOSIS — E782 Mixed hyperlipidemia: Secondary | ICD-10-CM

## 2020-09-20 DIAGNOSIS — E669 Obesity, unspecified: Secondary | ICD-10-CM | POA: Diagnosis not present

## 2020-09-20 DIAGNOSIS — F3181 Bipolar II disorder: Secondary | ICD-10-CM

## 2020-09-20 DIAGNOSIS — R7303 Prediabetes: Secondary | ICD-10-CM

## 2020-09-20 LAB — BAYER DCA HB A1C WAIVED: HB A1C (BAYER DCA - WAIVED): 5.8 % (ref <7.0)

## 2020-09-20 LAB — MICROALBUMIN, URINE WAIVED
Creatinine, Urine Waived: 200 mg/dL (ref 10–300)
Microalb, Ur Waived: 10 mg/L (ref 0–19)
Microalb/Creat Ratio: <30 mg/g (ref <30)

## 2020-09-20 NOTE — Assessment & Plan Note (Signed)
Chronic, stable.  Denies SI/HI.  Has been stable on current regimen for years.  Continue current medication regimen and adjust as needed.  Refills up to date.

## 2020-09-20 NOTE — Progress Notes (Signed)
BP 114/74   Pulse 63   Temp 99.3 F (37.4 C) (Oral)   Wt 250 lb 12.8 oz (113.8 kg)   LMP  (LMP Unknown)   SpO2 96%   BMI 38.72 kg/m    Subjective:    Patient ID: Gabriella Hensley, female    DOB: January 25, 1966, 55 y.o.   MRN: [id number]  HPI: Gabriella Hensley is a 54 y.o. female  Chief Complaint  Patient presents with   Mood    A1c Check   HYPERLIPIDEMIA No current medications, last LDL 158.   Hyperlipidemia status: good compliance Aspirin:  no The 10-year ASCVD risk score Gabriella Bussing DC Jr., et al., 2013) is: 2.6%   Values used to calculate the score:     Age: 38 years     Sex: Female     Is Non-Hispanic African American: Yes     Diabetic: No     Tobacco smoker: No     Systolic Blood Pressure: 182 mmHg     Is BP treated: No     HDL Cholesterol: 47 mg/dL     Total Cholesterol: 225 mg/dL Chest pain:  no Coronary artery disease:  no Family history CAD:  no Family history early CAD:  no    PREDIABETES Last A1C in September 2021 = 6.2%, has gained 20 lbs due to foot injury over past months. Polydipsia/polyuria: no Visual disturbance: no Chest pain: no Paresthesias: no  BIPOLAR DISORDER: Continues on Wellbutrin and Risperdal with good control. Has been on Risperdal for 15 years.  Denies SI/HI. Mood status: stable Satisfied with current treatment?: yes Symptom severity: mild Duration of current treatment : chronic Side effects: no Medication compliance: good compliance Psychotherapy/counseling: none Depressed mood: no Anxious mood: no Anhedonia: no Significant weight loss or gain: no Insomnia: none Fatigue: no Feelings of worthlessness or guilt: no Impaired concentration/indecisiveness: no Suicidal ideations: no Hopelessness: no Crying spells: no Depression screen [facility] 2/9 09/20/2020 07/14/2019 07/01/2018 12/30/2017 06/21/2017  Decreased Interest 0 0 0 0 0  Down, Depressed, Hopeless 0 0 0 0 0  PHQ - 2 Score 0 0 0 0 0  Altered sleeping 0 0 0 0 0  Tired, decreased energy 1  0 0 0 0  Change in appetite 1 0 0 0 0  Feeling bad or failure about yourself  0 0 0 0 0  Trouble concentrating 0 0 1 0 0  Moving slowly or fidgety/restless 0 0 0 0 0  Suicidal thoughts 0 0 0 0 0  PHQ-9 Score 2 0 1 0 0  Difficult doing work/chores - - Not difficult at all - -   Relevant past medical, surgical, family and social history reviewed and updated as indicated. Interim medical history since our last visit reviewed. Allergies and medications reviewed and updated.  Review of Systems  Constitutional:  Negative for activity change, appetite change, diaphoresis, fatigue and fever.  Respiratory:  Negative for cough, chest tightness and shortness of breath.   Cardiovascular:  Negative for chest pain, palpitations and leg swelling.  Gastrointestinal: Negative.   Endocrine: Negative for cold intolerance, heat intolerance, polydipsia, polyphagia and polyuria.  Neurological: Negative.   Psychiatric/Behavioral: Negative.     Per HPI unless specifically indicated above     Objective:    BP 114/74   Pulse 63   Temp 99.3 F (37.4 C) (Oral)   Wt 250 lb 12.8 oz (113.8 kg)   LMP  (LMP Unknown)   SpO2 96%   BMI 38.72 kg/m  Wt Readings from Last 3 Encounters:  09/20/20 250 lb 12.8 oz (113.8 kg)  03/21/20 240 lb 6 oz (109 kg)  07/14/19 236 lb (107 kg)    Physical Exam Vitals and nursing note reviewed.  Constitutional:      General: She is awake. She is not in acute distress.    Appearance: She is well-developed. She is obese. She is not ill-appearing.  HENT:     Head: Normocephalic.     Right Ear: Hearing normal.     Left Ear: Hearing normal.  Eyes:     General: Lids are normal.        Right eye: No discharge.        Left eye: No discharge.     Conjunctiva/sclera: Conjunctivae normal.     Pupils: Pupils are equal, round, and reactive to light.  Neck:     Vascular: No carotid bruit.  Cardiovascular:     Rate and Rhythm: Normal rate and regular rhythm.     Heart sounds:  Normal heart sounds. No murmur heard.   No gallop.  Pulmonary:     Effort: Pulmonary effort is normal. No accessory muscle usage or respiratory distress.     Breath sounds: Normal breath sounds.  Abdominal:     General: Bowel sounds are normal.     Palpations: Abdomen is soft.  Musculoskeletal:     Cervical back: Normal range of motion and neck supple.     Right lower leg: No edema.     Left lower leg: No edema.     Comments: Boot on left foot due to recent surgery.  Skin:    General: Skin is warm and dry.  Neurological:     Mental Status: She is alert and oriented to person, place, and time.  Psychiatric:        Attention and Perception: Attention normal.        Mood and Affect: Mood normal.        Speech: Speech normal.        Behavior: Behavior normal. Behavior is cooperative.        Thought Content: Thought content normal.   Results for orders placed or performed in visit on 03/23/20  TSH  Result Value Ref Range   TSH 2.230 0.450 - 4.500 uIU/mL  Lipid Panel w/o Chol/HDL Ratio  Result Value Ref Range   Cholesterol, Total 225 (H) 100 - 199 mg/dL   Triglycerides 114 0 - 149 mg/dL   HDL 47 >39 mg/dL   VLDL Cholesterol Cal 20 5 - 40 mg/dL   LDL Chol Calc (NIH) 158 (H) 0 - 99 mg/dL  HgB A1c  Result Value Ref Range   Hgb A1c MFr Bld 6.2 (H) 4.8 - 5.6 %   Est. average glucose Bld gHb Est-mCnc 131 mg/dL  Hepatitis C antibody  Result Value Ref Range   Hep C Virus Ab <0.1 0.0 - 0.9 s/co ratio  Comprehensive metabolic panel  Result Value Ref Range   Glucose 101 (H) 65 - 99 mg/dL   BUN 9 6 - 24 mg/dL   Creatinine, Ser 0.79 0.57 - 1.00 mg/dL   GFR calc non Af Amer 85 >59 mL/min/1.73   GFR calc Af Amer 98 >59 mL/min/1.73   BUN/Creatinine Ratio 11 9 - 23   Sodium 140 134 - 144 mmol/L   Potassium 4.4 3.5 - 5.2 mmol/L   Chloride 104 96 - 106 mmol/L   CO2 22 20 - 29 mmol/L   Calcium  9.2 8.7 - 10.2 mg/dL   Total Protein 6.8 6.0 - 8.5 g/dL   Albumin 4.2 3.8 - 4.9 g/dL    Globulin, Total 2.6 1.5 - 4.5 g/dL   Albumin/Globulin Ratio 1.6 1.2 - 2.2   Bilirubin Total 0.4 0.0 - 1.2 mg/dL   Alkaline Phosphatase 117 44 - 121 IU/L   AST 15 0 - 40 IU/L   ALT 14 0 - 32 IU/L  CBC with Differential/Platelet  Result Value Ref Range   WBC 5.0 3.4 - 10.8 x10E3/uL   RBC 4.41 3.77 - 5.28 x10E6/uL   Hemoglobin 12.5 11.1 - 15.9 g/dL   Hematocrit 38.2 34.0 - 46.6 %   MCV 87 79 - 97 fL   MCH 28.3 26.6 - 33.0 pg   MCHC 32.7 31.5 - 35.7 g/dL   RDW 12.6 11.7 - 15.4 %   Platelets 227 150 - 450 x10E3/uL   Neutrophils 48 Not Estab. %   Lymphs 42 Not Estab. %   Monocytes 8 Not Estab. %   Eos 2 Not Estab. %   Basos 0 Not Estab. %   Neutrophils Absolute 2.4 1.4 - 7.0 x10E3/uL   Lymphocytes Absolute 2.1 0.7 - 3.1 x10E3/uL   Monocytes Absolute 0.4 0.1 - 0.9 x10E3/uL   EOS (ABSOLUTE) 0.1 0.0 - 0.4 x10E3/uL   Basophils Absolute 0.0 0.0 - 0.2 x10E3/uL   Immature Granulocytes 0 Not Estab. %   Immature Grans (Abs) 0.0 0.0 - 0.1 x10E3/uL      Assessment & Plan:   Problem List Items Addressed This Visit       Other   Hyperlipidemia    Chronic, ongoing.  No current medications as ASCVD 2.6%.  Continue diet focus.  Lipid panel today.       Relevant Orders   Comprehensive metabolic panel   Lipid Panel w/o Chol/HDL Ratio   Bipolar 2 disorder (HCC) - Primary    Chronic, stable.  Denies SI/HI.  Has been stable on current regimen for years.  Continue current medication regimen and adjust as needed.  Refills up to date.       Obesity (BMI 35.0-39.9 without comorbidity)    BMI 38.72.  Recommended eating smaller high protein, low fat meals more frequently and exercising 30 mins a day 5 times a week with a goal of 10-15lb weight loss in the next 3 months. Patient voiced their understanding and motivation to adhere to these recommendations.        Prediabetes    Ongoing, continues to focus on diet changes.  Will recheck A1C today and recommend continued focus on diabetic diet.   Initiate medication as needed.       Relevant Orders   Bayer DCA Hb A1c Waived   Microalbumin, Urine Waived   TSH     Follow up plan: Return in about 6 months (around 03/22/2021) for Annual physical.

## 2020-09-20 NOTE — Assessment & Plan Note (Signed)
Ongoing, continues to focus on diet changes.  Will recheck A1C today and recommend continued focus on diabetic diet.  Initiate medication as needed.

## 2020-09-20 NOTE — Patient Instructions (Addendum)
I recommend to start taking Vitamin B12 1000 MCG daily and a multivitamin.  Prediabetes Eating Plan Prediabetes is a condition that causes blood sugar (glucose) levels to be higher than normal. This increases the risk for developing type 2 diabetes (type 2 diabetes mellitus). Working with a health care provider or nutrition specialist (dietitian) to make diet and lifestyle changes can help prevent the onset of diabetes. These changes may help you: Control your blood glucose levels. Improve your cholesterol levels. Manage your blood pressure. What are tips for following this plan? Reading food labels Read food labels to check the amount of fat, salt (sodium), and sugar in prepackaged foods. Avoid foods that have: Saturated fats. Trans fats. Added sugars. Avoid foods that have more than 300 milligrams (mg) of sodium per serving. Limit your sodium intake to less than 2,300 mg each day. Shopping Avoid buying pre-made and processed foods. Avoid buying drinks with added sugar. Cooking Cook with olive oil. Do not use butter, lard, or ghee. Bake, broil, grill, steam, or boil foods. Avoid frying. Meal planning  Work with your dietitian to create an eating plan that is right for you. This may include tracking how many calories you take in each day. Use a food diary, notebook, or mobile application to track what you eat at each meal. Consider following a Mediterranean diet. This includes: Eating several servings of fresh fruits and vegetables each day. Eating fish at least twice a week. Eating one serving each day of whole grains, beans, nuts, and seeds. Using olive oil instead of other fats. Limiting alcohol. Limiting red meat. Using nonfat or low-fat dairy products. Consider following a plant-based diet. This includes dietary choices that focus on eating mostly vegetables and fruit, grains, beans, nuts, and seeds. If you have high blood pressure, you may need to limit your sodium intake or  follow a diet such as the DASH (Dietary Approaches to Stop Hypertension) eating plan. The DASH diet aims to lower high blood pressure.  Lifestyle Set weight loss goals with help from your health care team. It is recommended that most people with prediabetes lose 7% of their body weight. Exercise for at least 30 minutes 5 or more days a week. Attend a support group or seek support from a mental health counselor. Take over-the-counter and prescription medicines only as told by your health care provider. What foods are recommended? Fruits Berries. Bananas. Apples. Oranges. Grapes. Papaya. Mango. Pomegranate. Kiwi.Grapefruit. Cherries. Vegetables Lettuce. Spinach. Peas. Beets. Cauliflower. Cabbage. Broccoli. Carrots.Tomatoes. Squash. Eggplant. Herbs. Peppers. Onions. Cucumbers. Brussels sprouts. Grains Whole grains, such as whole-wheat or whole-grain breads, crackers, cereals, and pasta. Unsweetened oatmeal. Bulgur. Barley. Quinoa. Brown rice. Corn orwhole-wheat flour tortillas or taco shells. Meats and other proteins Seafood. Poultry without skin. Lean cuts of pork and beef. Tofu. Eggs. Nuts.Beans. Dairy Low-fat or fat-free dairy products, such as yogurt, cottage cheese, and cheese. Beverages Water. Tea. Coffee. Sugar-free or diet soda. Seltzer water. Low-fat or nonfatmilk. Milk alternatives, such as soy or almond milk. Fats and oils Olive oil. Canola oil. Sunflower oil. Grapeseed oil. Avocado. Walnuts. Sweets and desserts Sugar-free or low-fat pudding. Sugar-free or low-fat ice cream and other frozentreats. Seasonings and condiments Herbs. Sodium-free spices. Mustard. Relish. Low-salt, low-sugar ketchup.Low-salt, low-sugar barbecue sauce. Low-fat or fat-free mayonnaise. The items listed above may not be a complete list of recommended foods and beverages. Contact a dietitian for more information. What foods are not recommended? Fruits Fruits canned with syrup. Vegetables Canned  vegetables. Frozen vegetables with butter or  cream sauce. Grains Refined white flour and flour products, such as bread, pasta, snack foods, andcereals. Meats and other proteins Fatty cuts of meat. Poultry with skin. Breaded or fried meat. Processed meats. Dairy Full-fat yogurt, cheese, or milk. Beverages Sweetened drinks, such as iced tea and soda. Fats and oils Butter. Lard. Ghee. Sweets and desserts Baked goods, such as cake, cupcakes, pastries, cookies, and cheesecake. Seasonings and condiments Spice mixes with added salt. Ketchup. Barbecue sauce. Mayonnaise. The items listed above may not be a complete list of foods and beverages that are not recommended. Contact a dietitian for more information. Where to find more information American Diabetes Association: www.diabetes.org Summary You may need to make diet and lifestyle changes to help prevent the onset of diabetes. These changes can help you control blood sugar, improve cholesterol levels, and manage blood pressure. Set weight loss goals with help from your health care team. It is recommended that most people with prediabetes lose 7% of their body weight. Consider following a Mediterranean diet. This includes eating plenty of fresh fruits and vegetables, whole grains, beans, nuts, seeds, fish, and low-fat dairy, and using olive oil instead of other fats. This information is not intended to replace advice given to you by your health care provider. Make sure you discuss any questions you have with your healthcare provider. Document Revised: 06/11/2019 Document Reviewed: 06/11/2019 Elsevier Patient Education  [place].

## 2020-09-20 NOTE — Assessment & Plan Note (Signed)
BMI 38.72.  Recommended eating smaller high protein, low fat meals more frequently and exercising 30 mins a day 5 times a week with a goal of 10-15lb weight loss in the next 3 months. Patient voiced their understanding and motivation to adhere to these recommendations.

## 2020-09-20 NOTE — Assessment & Plan Note (Signed)
Chronic, ongoing.  No current medications as ASCVD 2.6%.  Continue diet focus.  Lipid panel today.

## 2020-09-21 LAB — COMPREHENSIVE METABOLIC PANEL
ALT: 15 IU/L (ref 0–32)
AST: 18 IU/L (ref 0–40)
Albumin/Globulin Ratio: 1.4 (ref 1.2–2.2)
Albumin: 4.4 g/dL (ref 3.8–4.9)
Alkaline Phosphatase: 126 IU/L — ABNORMAL HIGH (ref 44–121)
BUN/Creatinine Ratio: 14 (ref 9–23)
BUN: 11 mg/dL (ref 6–24)
Bilirubin Total: 0.5 mg/dL (ref 0.0–1.2)
CO2: 22 mmol/L (ref 20–29)
Calcium: 9.5 mg/dL (ref 8.7–10.2)
Chloride: 103 mmol/L (ref 96–106)
Creatinine, Ser: 0.79 mg/dL (ref 0.57–1.00)
Globulin, Total: 3.2 g/dL (ref 1.5–4.5)
Glucose: 93 mg/dL (ref 65–99)
Potassium: 4.3 mmol/L (ref 3.5–5.2)
Sodium: 141 mmol/L (ref 134–144)
Total Protein: 7.6 g/dL (ref 6.0–8.5)
eGFR: 89 mL/min/{1.73_m2} (ref >59)

## 2020-09-21 LAB — LIPID PANEL W/O CHOL/HDL RATIO
Cholesterol, Total: 236 mg/dL — ABNORMAL HIGH (ref 100–199)
HDL: 52 mg/dL (ref >39)
LDL Chol Calc (NIH): 165 mg/dL — ABNORMAL HIGH (ref 0–99)
Triglycerides: 105 mg/dL (ref 0–149)
VLDL Cholesterol Cal: 19 mg/dL (ref 5–40)

## 2020-09-21 LAB — TSH: TSH: 2.4 u[IU]/mL (ref 0.450–4.500)

## 2020-09-21 NOTE — Progress Notes (Signed)
Contacted via MyChart The 10-year ASCVD risk score Mikey Bussing DC Jr., et al., 2013) is: 2.4%   Values used to calculate the score:     Age: 55 years     Sex: Female     Is Non-Hispanic African American: Yes     Diabetic: No     Tobacco smoker: No     Systolic Blood Pressure: 111 mmHg     Is BP treated: No     HDL Cholesterol: 52 mg/dL     Total Cholesterol: 236 mg/dL   Good morning Blaklee, your labs have returned: - Kidney function, creatinine and eGFR, is normal.  Liver function, AST and ALT, is normal.  Alkaline phosphatase is mildly elevated, this can be related to gall bladder, we will monitor and ensure it does not trend up in future. - Thyroid is normal. - A1c is 5.8% and remains in prediabetic range.  Continue focus on diet and regular activity. - Your cholesterol is still high, but continued recommendations to make lifestyle changes. Your LDL is above normal. The LDL is the bad cholesterol. Over time and in combination with inflammation and other factors, this contributes to plaque which in turn may lead to stroke and/or heart attack down the road. Sometimes high LDL is primarily genetic, and people might be eating all the right foods but still have high numbers. Other times, there is room for improvement in one's diet and eating healthier can bring this number down and potentially reduce one's risk of heart attack and/or stroke.   To reduce your LDL, Remember - more fruits and vegetables, more fish, and limit red meat and dairy products. More soy, nuts, beans, barley, lentils, oats and plant sterol ester enriched margarine instead of butter. I also encourage eliminating sugar and processed food. Remember, shop on the outside of the grocery store and visit your [employer]. If you would like to talk with me about dietary changes plus or minus medications for your cholesterol, please let me know. We should recheck your cholesterol in 6 months.  Any questions? Keep being awesome!!   Thank you for allowing me to participate in your care.  I appreciate you. Kindest regards, Yamilka Lopiccolo

## 2020-11-27 DIAGNOSIS — Z96652 Presence of left artificial knee joint: Secondary | ICD-10-CM | POA: Diagnosis not present

## 2020-11-27 DIAGNOSIS — M1712 Unilateral primary osteoarthritis, left knee: Secondary | ICD-10-CM | POA: Diagnosis not present

## 2020-11-27 DIAGNOSIS — S8992XA Unspecified injury of left lower leg, initial encounter: Secondary | ICD-10-CM | POA: Diagnosis not present

## 2020-11-27 DIAGNOSIS — S8392XA Sprain of unspecified site of left knee, initial encounter: Secondary | ICD-10-CM | POA: Diagnosis not present

## 2020-12-08 DIAGNOSIS — S83402A Sprain of unspecified collateral ligament of left knee, initial encounter: Secondary | ICD-10-CM | POA: Diagnosis not present

## 2021-03-23 ENCOUNTER — Encounter: Payer: Self-pay | Admitting: Nurse Practitioner

## 2021-04-13 ENCOUNTER — Other Ambulatory Visit: Payer: Self-pay | Admitting: Nurse Practitioner

## 2021-04-13 DIAGNOSIS — F3181 Bipolar II disorder: Secondary | ICD-10-CM

## 2021-04-13 NOTE — Telephone Encounter (Signed)
Requested medications are due for refill today.  yes  Requested medications are on the active medications list.  yes  Last refill. 03/21/2020  Future visit scheduled.   no  Notes to clinic.  Medication not delegated. Labs are expired/ missing. Due for office visit.    Requested Prescriptions  Pending Prescriptions Disp Refills   risperiDONE (RISPERDAL) 3 MG tablet [Pharmacy Med Name: risperiDONE 3 MG Oral Tablet] 30 tablet 0    Sig: Take 1 tablet by mouth once daily     Not Delegated - Psychiatry:  Antipsychotics - Second Generation (Atypical) - risperidone Failed - 04/13/2021  2:51 AM      Failed - This refill cannot be delegated      Failed - Prolactin Level (serum) in normal range and within 180 days    No results found for: PROLACTIN, TOTPROLACTIN, LABPROL        Failed - ALT in normal range and within 180 days    ALT  Date Value Ref Range Status  09/20/2020 15 0 - 32 IU/L Final   SGPT (ALT)  Date Value Ref Range Status  06/06/2011 23 U/L Final    Comment:    12-78 NOTE: NEW REFERENCE RANGE 02/16/2011           Failed - AST in normal range and within 180 days    AST  Date Value Ref Range Status  09/20/2020 18 0 - 40 IU/L Final   SGOT(AST)  Date Value Ref Range Status  06/06/2011 18 15 - 37 Unit/L Final          Failed - Valid encounter within last 6 months    Recent Outpatient Visits           6 months ago Bipolar 2 disorder ([place])   [place], Henrine Screws T, NP   1 year ago Encounter for annual physical exam   [place], Henrine Screws T, NP   1 year ago Bipolar 2 disorder ([place])   [place], [place] T, NP   1 year ago Acute pain of left knee   [place], [place] T, NP   1 year ago Acute pain of left knee   [facility] [place], Megan P, DO              Signed Prescriptions Disp Refills   buPROPion (WELLBUTRIN) 100 MG tablet 30 tablet 0    Sig: Take 1  tablet by mouth once daily     Psychiatry: Antidepressants - bupropion Failed - 04/13/2021  2:51 AM      Failed - Valid encounter within last 6 months    Recent Outpatient Visits           6 months ago Bipolar 2 disorder ([place])   [place], [place]unn T, NP   1 year ago Encounter for annual physical exam   [place], Henrine Screws T, NP   1 year ago Bipolar 2 disorder ([place])   [place], [place] T, NP   1 year ago Acute pain of left knee   [facility] [place], Guin T, NP   1 year ago Acute pain of left knee   [facility], Megan P, DO              Passed - Last BP in normal range    BP Readings from Last 1 Encounters:  09/20/20 114/74

## 2021-04-13 NOTE — Telephone Encounter (Signed)
Lmom for patient to call back to schedule follow up appointment with provider for refill.

## 2021-04-13 NOTE — Telephone Encounter (Signed)
Requested Prescriptions  Pending Prescriptions Disp Refills   buPROPion (WELLBUTRIN) 100 MG tablet [Pharmacy Med Name: buPROPion HCl 100 MG Oral Tablet] 30 tablet 0    Sig: Take 1 tablet by mouth once daily     Psychiatry: Antidepressants - bupropion Failed - 04/13/2021  2:51 AM      Failed - Valid encounter within last 6 months    Recent Outpatient Visits          6 months ago Bipolar 2 disorder ([place])   [place], [place] T, NP   1 year ago Encounter for annual physical exam   [place], Henrine Screws T, NP   1 year ago Bipolar 2 disorder ([place])   [place], Jolene T, NP   1 year ago Acute pain of left knee   [place], [place] T, NP   1 year ago Acute pain of left knee   [facility], Megan P, DO             Passed - Last BP in normal range    BP Readings from Last 1 Encounters:  09/20/20 114/74          risperiDONE (RISPERDAL) 3 MG tablet [Pharmacy Med Name: risperiDONE 3 MG Oral Tablet] 30 tablet 0    Sig: Take 1 tablet by mouth once daily     Not Delegated - Psychiatry:  Antipsychotics - Second Generation (Atypical) - risperidone Failed - 04/13/2021  2:51 AM      Failed - This refill cannot be delegated      Failed - Prolactin Level (serum) in normal range and within 180 days    No results found for: PROLACTIN, TOTPROLACTIN, LABPROL       Failed - ALT in normal range and within 180 days    ALT  Date Value Ref Range Status  09/20/2020 15 0 - 32 IU/L Final   SGPT (ALT)  Date Value Ref Range Status  06/06/2011 23 U/L Final    Comment:    12-78 NOTE: NEW REFERENCE RANGE 02/16/2011          Failed - AST in normal range and within 180 days    AST  Date Value Ref Range Status  09/20/2020 18 0 - 40 IU/L Final   SGOT(AST)  Date Value Ref Range Status  06/06/2011 18 15 - 37 Unit/L Final         Failed - Valid encounter within last 6 months    Recent  Outpatient Visits          6 months ago Bipolar 2 disorder ([place])   [place], Henrine Screws T, NP   1 year ago Encounter for annual physical exam   [place], Henrine Screws T, NP   1 year ago Bipolar 2 disorder ([place])   [place], Jolene T, NP   1 year ago Acute pain of left knee   [place], [place] T, NP   1 year ago Acute pain of left knee   [place], [place], DO             Patient will need an office visit for further refills. Courtesy refill.

## 2021-04-17 ENCOUNTER — Encounter: Payer: Self-pay | Admitting: Nurse Practitioner

## 2021-04-17 ENCOUNTER — Telehealth (INDEPENDENT_AMBULATORY_CARE_PROVIDER_SITE_OTHER): Payer: Self-pay | Admitting: Nurse Practitioner

## 2021-04-17 DIAGNOSIS — E782 Mixed hyperlipidemia: Secondary | ICD-10-CM

## 2021-04-17 DIAGNOSIS — R7303 Prediabetes: Secondary | ICD-10-CM

## 2021-04-17 DIAGNOSIS — E559 Vitamin D deficiency, unspecified: Secondary | ICD-10-CM

## 2021-04-17 DIAGNOSIS — E669 Obesity, unspecified: Secondary | ICD-10-CM

## 2021-04-17 DIAGNOSIS — F3181 Bipolar II disorder: Secondary | ICD-10-CM

## 2021-04-17 MED ORDER — BUPROPION HCL 100 MG PO TABS: 100.0000 mg | ORAL_TABLET | Freq: Every day | ORAL | 4 refills | Status: DC

## 2021-04-17 NOTE — Assessment & Plan Note (Signed)
Recommended eating smaller high protein, low fat meals more frequently and exercising 30 mins a day 5 times a week with a goal of 10-15lb weight loss in the next 3 months. Patient voiced their understanding and motivation to adhere to these recommendations.  

## 2021-04-17 NOTE — Assessment & Plan Note (Signed)
Chronic, stable.  Denies SI/HI.  Has been stable on current regimen for years.  Continue current medication regimen and adjust as needed.  Refills sent, is scheduled for physical upcoming, will obtain labs then.

## 2021-04-17 NOTE — Patient Instructions (Signed)
Bipolar II Disorder Bipolar II disorder is a mental health disorder in which a person has episodes of emotional highs (hypomania) and episodes of emotional lows (depression). The emotional highs are less extreme than the highs in bipolar I disorder, and they do not last as long. People with bipolar II disorder have had at least one hypomanic episode in their lives, which is usually followed by a depressive episode. Some people may have cycles of hypomanic and depressive episodes. Some people with bipolar II disorder may lead very normal lives between episodes. What are the causes? The cause of this condition is not known. What increases the risk? The following factors may make you more likely to develop this condition: Having a family member with the disorder. Having an imbalance of certain chemicals in the brain (neurotransmitters). Experiencing stress, such as from illness, divorce, financial problems, or a death. Having certain conditions that affect the brain or spinal cord (neurologic conditions). Having had a brain injury (trauma). What are the signs or symptoms? Symptoms of this condition include the following: Symptoms of hypomania Very high self-esteem or self-confidence. Less need for sleep. Unusual talkativeness. Speech may be very fast. Racing thoughts, with quick shifts between topics that may or may not be related (flight of ideas). Being more able or less able to concentrate than normal. Increased agitation. This could be pacing, squirming, fidgeting, or finger and toe tapping. Impulsive behavior and poor judgment. These may result in high-risk activities, such as: Being sexual with people you normally would not be sexual with. Spending money you have borrowed on things you do not need. Symptoms of depression Extreme sadness, uncontrollable crying, or feeling hopeless, worthless, or numb. Sleep problems, such as trouble falling asleep or staying asleep (insomnia), waking  early, or sleeping too much. No longer enjoying things you used to enjoy. Isolation, or spending time alone often. Lack of energy or moving more slowly than normal. Trouble making decisions. Changes in appetite, such as eating too much or not eating. Thoughts of death, or wanting to harm yourself. Sometimes, you may have a mix of symptoms of hypomania and depression at the same time. Stress can often trigger these symptoms. How is this diagnosed? This condition may be diagnosed based on: A mental health evaluation that includes a review of your emotional episodes. Your medical history. Use of alcohol, drugs, and prescription medicines. Certain medical conditions and substances can cause secondary bipolar disorder. This has symptoms like the symptoms of bipolar disorder. Your health care provider may ask you to take a short test to help understand your symptoms. You may also be asked to see a mental health specialist for further evaluation or to start treatment. How is this treated?   This condition is a long-term (chronic) illness. It is often managed with ongoing treatment rather than treatment only when symptoms occur. A combination of treatments is often used. Treatment may include: Talk therapy (psychotherapy) to help you manage bipolar disorder. Talk therapy includes cognitive behavioral therapy (CBT) and family therapy. Psychoeducation. This helps you and others understand how your disorder is managed. Include friends and family in educational sessions so they learn how best to support you. Methods of managing your condition, such as journaling or relaxation exercises. These include: Yoga. Meditation. Deep breathing. Lifestyle changes, such as: Avoiding alcohol and drug use. Exercising regularly. Setting a regular bedtime and wake time. Eating a healthy diet. Medicines, usually medicines called mood stabilizers. Medicines can be prescribed by health care providers who specialize in  treating mental health disorders (psychiatrists). If symptoms occur during use of a mood stabilizer, other medicines may be added. Follow these instructions at home: Activity Return to your normal activities as told by your health care provider. Find activities that you enjoy, and make time to do them. Get regular exercise. Lifestyle  Follow a set daily schedule. Eat a healthy diet that includes fresh fruits and vegetables, whole grains, low-fat dairy, and lean meat. Get at least 7-8 hours of sleep each night. Avoid using products that contain nicotine or tobacco. If you want help quitting, ask your health care provider. Avoid alcohol and drugs. They can affect how medicine works and make symptoms worse. General instructions Take over-the-counter and prescription medicines only as told by your health care provider. You may think about stopping your medicine, but you need to take all your medicine as prescribed. This helps manage your symptoms. Consider joining a support group. Your health care provider may be able to recommend one. Talk with your family and friends about your treatment goals and how they can help. Keep all follow-up visits. This is important. Where to find more information [employer] on Mental Illness: nami.[place]: https://www.frey.org/ Contact a health care provider if: Your symptoms get worse, or your loved ones tell you that your symptoms are getting worse. You have uncomfortable side effects from your medicine. You have trouble sleeping. You have trouble doing daily activities. You feel unsafe in your surroundings. You are using alcohol or drugs to manage your symptoms. Get help right away if: You have new symptoms. You have thoughts about harming yourself or others. You are considering suicide. If you ever feel like you may hurt yourself or others, or have thoughts about taking your own life, get help right away. Go to your nearest  emergency department or: Call your local emergency services (911 in the [place] Call a suicide crisis helpline, such as the [place] at 972-573-0995 or 988 in the [place]. This is open 24 hours a day in the [place] Text the Crisis Text Line at 315-340-5064 (in the [place].). Summary Bipolar II disorder is a lifelong mental health disorder in which a person has episodes of hypomania and depression. This disorder is mainly treated with a combination of talk therapy, education, methods of managing the condition, and medicines. Talk with your family and friends about your treatment goals and how they can help. Get help right away if you are considering suicide. This information is not intended to replace advice given to you by your health care provider. Make sure you discuss any questions you have with your health care provider. Document Revised: 10/06/2020 Document Reviewed: 09/01/2020 Elsevier Patient Education  [place].

## 2021-04-17 NOTE — Assessment & Plan Note (Signed)
Chronic, ongoing.  No current medications as ASCVD 4.9%.  Continue diet focus.  Lipid panel at upcoming physical.

## 2021-04-17 NOTE — Progress Notes (Signed)
LMP  (LMP Unknown)    Subjective:    Patient ID: Gabriella Hensley, female    DOB: December 15, 1965, 56 y.o.   MRN: [id number]  HPI: Gabriella Hensley is a 56 y.o. female  Chief Complaint  Patient presents with   Medication Refill    Patient is requesting refill on medication(s). Patient denies having any concerns at today's visit.    This visit was completed via video visit through MyChart due to the restrictions of the COVID-19 pandemic. All issues as above were discussed and addressed. Physical exam was done as above through visual confirmation on video through MyChart. If it was felt that the patient should be evaluated in the office, they were directed there. The patient verbally consented to this visit. Location of the patient: home Location of the provider: work Those involved with this call:  Provider: Marnee Guarneri, DNP CMA: Irena Reichmann, [place] Desk/Registration: [employer]  Time spent on call:  21 minutes with patient face to face via video conference. More than 50% of this time was spent in counseling and coordination of care. 15 minutes total spent in review of patient's record and preparation of their chart. I verified patient identity using two factors (patient name and date of birth). Patient consents verbally to being seen via telemedicine visit today.     HYPERLIPIDEMIA No current medications, last LDL 165.   Hyperlipidemia status: good compliance Aspirin:  no The 10-year ASCVD risk score (Arnett DK, et al., 2019) is: 4.9%   Values used to calculate the score:     Age: 66 years     Sex: Female     Is Non-Hispanic African American: Yes     Diabetic: No     Tobacco smoker: No     Systolic Blood Pressure: 625 mmHg     Is BP treated: No     HDL Cholesterol: 52 mg/dL     Total Cholesterol: 236 mg/dL Chest pain:  no Coronary artery disease:  no Family history CAD:  no Family history early CAD:  no    PREDIABETES Last A1c in June 5.8%. Polydipsia/polyuria:  no Visual disturbance: no Chest pain: no Paresthesias: no  BIPOLAR DISORDER: Continues on Wellbutrin and Risperdal with good control. Has been on Risperdal for > 15 years.  Denies SI/HI. Mood status: stable Satisfied with current treatment?: yes Symptom severity: mild Duration of current treatment : chronic Side effects: no Medication compliance: good compliance Psychotherapy/counseling: none Depressed mood: no Anxious mood: no Anhedonia: no Significant weight loss or gain: no Insomnia: none Fatigue: no Feelings of worthlessness or guilt: no Impaired concentration/indecisiveness: no Suicidal ideations: no Hopelessness: no Crying spells: no Depression screen [facility] 2/9 09/20/2020 07/14/2019 07/01/2018 12/30/2017 06/21/2017  Decreased Interest 0 0 0 0 0  Down, Depressed, Hopeless 0 0 0 0 0  PHQ - 2 Score 0 0 0 0 0  Altered sleeping 0 0 0 0 0  Tired, decreased energy 1 0 0 0 0  Change in appetite 1 0 0 0 0  Feeling bad or failure about yourself  0 0 0 0 0  Trouble concentrating 0 0 1 0 0  Moving slowly or fidgety/restless 0 0 0 0 0  Suicidal thoughts 0 0 0 0 0  PHQ-9 Score 2 0 1 0 0  Difficult doing work/chores - - Not difficult at all - -   Relevant past medical, surgical, family and social history reviewed and updated as indicated. Interim medical history since our last  visit reviewed. Allergies and medications reviewed and updated.  Review of Systems  Constitutional:  Negative for activity change, appetite change, diaphoresis, fatigue and fever.  Respiratory:  Negative for cough, chest tightness and shortness of breath.   Cardiovascular:  Negative for chest pain, palpitations and leg swelling.  Gastrointestinal: Negative.   Endocrine: Negative for cold intolerance, heat intolerance, polydipsia, polyphagia and polyuria.  Neurological: Negative.   Psychiatric/Behavioral: Negative.     Per HPI unless specifically indicated above     Objective:    LMP  (LMP Unknown)   Wt  Readings from Last 3 Encounters:  09/20/20 250 lb 12.8 oz (113.8 kg)  03/21/20 240 lb 6 oz (109 kg)  07/14/19 236 lb (107 kg)    Physical Exam Vitals and nursing note reviewed.  Constitutional:      General: She is awake. She is not in acute distress.    Appearance: She is well-developed. She is not ill-appearing.  HENT:     Head: Normocephalic.     Right Ear: Hearing normal.     Left Ear: Hearing normal.  Eyes:     General: Lids are normal.        Right eye: No discharge.        Left eye: No discharge.     Conjunctiva/sclera: Conjunctivae normal.  Pulmonary:     Effort: Pulmonary effort is normal. No accessory muscle usage or respiratory distress.  Musculoskeletal:     Cervical back: Normal range of motion.  Neurological:     Mental Status: She is alert and oriented to person, place, and time.  Psychiatric:        Attention and Perception: Attention normal.        Mood and Affect: Mood normal.        Behavior: Behavior normal. Behavior is cooperative.        Thought Content: Thought content normal.        Judgment: Judgment normal.   Results for orders placed or performed in visit on 09/20/20  Bayer DCA Hb A1c Waived  Result Value Ref Range   HB A1C (BAYER DCA - WAIVED) 5.8 <7.0 %  Microalbumin, Urine Waived  Result Value Ref Range   Microalb, Ur Waived 10 0 - 19 mg/L   Creatinine, Urine Waived 200 10 - 300 mg/dL   Microalb/Creat Ratio <30 <30 mg/g  Comprehensive metabolic panel  Result Value Ref Range   Glucose 93 65 - 99 mg/dL   BUN 11 6 - 24 mg/dL   Creatinine, Ser 0.79 0.57 - 1.00 mg/dL   eGFR 89 >59 mL/min/1.73   BUN/Creatinine Ratio 14 9 - 23   Sodium 141 134 - 144 mmol/L   Potassium 4.3 3.5 - 5.2 mmol/L   Chloride 103 96 - 106 mmol/L   CO2 22 20 - 29 mmol/L   Calcium 9.5 8.7 - 10.2 mg/dL   Total Protein 7.6 6.0 - 8.5 g/dL   Albumin 4.4 3.8 - 4.9 g/dL   Globulin, Total 3.2 1.5 - 4.5 g/dL   Albumin/Globulin Ratio 1.4 1.2 - 2.2   Bilirubin Total 0.5 0.0 -  1.2 mg/dL   Alkaline Phosphatase 126 (H) 44 - 121 IU/L   AST 18 0 - 40 IU/L   ALT 15 0 - 32 IU/L  Lipid Panel w/o Chol/HDL Ratio  Result Value Ref Range   Cholesterol, Total 236 (H) 100 - 199 mg/dL   Triglycerides 105 0 - 149 mg/dL   HDL 52 >39 mg/dL  VLDL Cholesterol Cal 19 5 - 40 mg/dL   LDL Chol Calc (NIH) 165 (H) 0 - 99 mg/dL  TSH  Result Value Ref Range   TSH 2.400 0.450 - 4.500 uIU/mL      Assessment & Plan:   Problem List Items Addressed This Visit       Other   Bipolar 2 disorder ([place]) - Primary    Chronic, stable.  Denies SI/HI.  Has been stable on current regimen for years.  Continue current medication regimen and adjust as needed.  Refills sent, is scheduled for physical upcoming, will obtain labs then.      Relevant Medications   buPROPion (WELLBUTRIN) 100 MG tablet   Hyperlipidemia    Chronic, ongoing.  No current medications as ASCVD 4.9%.  Continue diet focus.  Lipid panel at upcoming physical.      Obesity (BMI 35.0-39.9 without comorbidity)    Recommended eating smaller high protein, low fat meals more frequently and exercising 30 mins a day 5 times a week with a goal of 10-15lb weight loss in the next 3 months. Patient voiced their understanding and motivation to adhere to these recommendations.       Prediabetes    Ongoing, continues to focus on diet changes.  Will recheck A1c at physical and recommend continued focus on diabetic diet.  Initiate medication as needed.       I discussed the assessment and treatment plan with the patient. The patient was provided an opportunity to ask questions and all were answered. The patient agreed with the plan and demonstrated an understanding of the instructions.   The patient was advised to call back or seek an in-person evaluation if the symptoms worsen or if the condition fails to improve as anticipated.   I provided 21+ minutes of time during this encounter.   Follow up plan: Return for as scheduled  05/09/21.

## 2021-04-17 NOTE — Assessment & Plan Note (Signed)
Ongoing, continues to focus on diet changes.  Will recheck A1c at physical and recommend continued focus on diabetic diet.  Initiate medication as needed.

## 2021-05-07 NOTE — Patient Instructions (Signed)

## 2021-05-09 ENCOUNTER — Encounter: Payer: Self-pay | Admitting: Nurse Practitioner

## 2021-05-09 ENCOUNTER — Ambulatory Visit (INDEPENDENT_AMBULATORY_CARE_PROVIDER_SITE_OTHER): Payer: BC Managed Care – PPO | Admitting: Nurse Practitioner

## 2021-05-09 ENCOUNTER — Other Ambulatory Visit: Payer: Self-pay

## 2021-05-09 VITALS — BP 135/84 | HR 54 | Temp 97.8°F | Ht 66.5 in | Wt 248.4 lb

## 2021-05-09 DIAGNOSIS — R399 Unspecified symptoms and signs involving the genitourinary system: Secondary | ICD-10-CM | POA: Insufficient documentation

## 2021-05-09 DIAGNOSIS — K219 Gastro-esophageal reflux disease without esophagitis: Secondary | ICD-10-CM

## 2021-05-09 DIAGNOSIS — E669 Obesity, unspecified: Secondary | ICD-10-CM

## 2021-05-09 DIAGNOSIS — R7303 Prediabetes: Secondary | ICD-10-CM | POA: Diagnosis not present

## 2021-05-09 DIAGNOSIS — Z0000 Encounter for general adult medical examination without abnormal findings: Secondary | ICD-10-CM | POA: Diagnosis not present

## 2021-05-09 DIAGNOSIS — E782 Mixed hyperlipidemia: Secondary | ICD-10-CM

## 2021-05-09 DIAGNOSIS — E559 Vitamin D deficiency, unspecified: Secondary | ICD-10-CM

## 2021-05-09 DIAGNOSIS — F3181 Bipolar II disorder: Secondary | ICD-10-CM

## 2021-05-09 DIAGNOSIS — M25522 Pain in left elbow: Secondary | ICD-10-CM | POA: Insufficient documentation

## 2021-05-09 DIAGNOSIS — Z23 Encounter for immunization: Secondary | ICD-10-CM | POA: Diagnosis not present

## 2021-05-09 DIAGNOSIS — G2401 Drug induced subacute dyskinesia: Secondary | ICD-10-CM

## 2021-05-09 DIAGNOSIS — R151 Fecal smearing: Secondary | ICD-10-CM | POA: Insufficient documentation

## 2021-05-09 LAB — URINALYSIS, ROUTINE W REFLEX MICROSCOPIC
Bilirubin, UA: NEGATIVE
Glucose, UA: NEGATIVE
Ketones, UA: NEGATIVE
Leukocytes,UA: NEGATIVE
Nitrite, UA: NEGATIVE
Protein,UA: NEGATIVE
RBC, UA: NEGATIVE
Specific Gravity, UA: 1.01 (ref 1.005–1.030)
Urobilinogen, Ur: 0.2 mg/dL (ref 0.2–1.0)
pH, UA: 6.5 (ref 5.0–7.5)

## 2021-05-09 LAB — WET PREP FOR TRICH, YEAST, CLUE
Clue Cell Exam: NEGATIVE
Trichomonas Exam: NEGATIVE
Yeast Exam: NEGATIVE

## 2021-05-09 LAB — MICROALBUMIN, URINE WAIVED
Creatinine, Urine Waived: 50 mg/dL (ref 10–300)
Microalb, Ur Waived: 10 mg/L (ref 0–19)
Microalb/Creat Ratio: <30 mg/g (ref <30)

## 2021-05-09 LAB — BAYER DCA HB A1C WAIVED: HB A1C (BAYER DCA - WAIVED): 5.7 % — ABNORMAL HIGH (ref 4.8–5.6)

## 2021-05-09 NOTE — Assessment & Plan Note (Signed)
BMI 39.49.  Recommended eating smaller high protein, low fat meals more frequently and exercising 30 mins a day 5 times a week with a goal of 10-15lb weight loss in the next 3 months. Patient voiced their understanding and motivation to adhere to these recommendations.

## 2021-05-09 NOTE — Progress Notes (Signed)
BP 135/84    Pulse (!) 54    Temp 97.8 F (36.6 C) (Oral)    Ht 5' 6.5" (1.689 m)    Wt 248 lb 6.4 oz (112.7 kg)    LMP  (LMP Unknown)    SpO2 98%    BMI 39.49 kg/m    Subjective:    Patient ID: Gabriella Hensley, female    DOB: 1965/08/09, 56 y.o.   MRN: [id number]   NOTE WRITTEN BY [employer] DNP STUDENT.  ASSESSMENT AND PLAN OF CARE REVIEWED WITH STUDENT, AGREE WITH ABOVE FINDINGS AND PLAN.   HPI: Gabriella Hensley is a 55 y.o. female presenting on 05/09/2021 for comprehensive medical examination. Current medical complaints include: pain in elbow and stool issues  She currently lives with: spouse Menopausal Symptoms: no  The 10-year ASCVD risk score (Arnett DK, et al., 2019) is: 4.8%   Values used to calculate the score:     Age: 67 years     Sex: Female     Is Non-Hispanic African American: Yes     Diabetic: No     Tobacco smoker: No     Systolic Blood Pressure: 782 mmHg     Is BP treated: No     HDL Cholesterol: 52 mg/dL     Total Cholesterol: 236 mg/dL  STOOL INCONTINENCE Reports mild issues with stool leakage she can not control and occasional urinary leakage since returning to work over past months.  Has 2 children, both c-section delivered.  Does feel pressure at times in the vaginal area.  Last colonoscopy 2017. Duration:months Onset: gradual Frequency: constant Alleviating factors:  Aggravating factors: Status: worse Treatments attempted: none Fever: no Nausea: no Vomiting: no Weight loss: no Decreased appetite: no Diarrhea: no Constipation: no Blood in stool: no Heartburn: no Jaundice: no Rash: no Dysuria/urinary frequency: no Hematuria: no History of sexually transmitted disease: no Recurrent NSAID use: no   VAGINAL DISCHARGE Duration: months 2-3 Discharge description: mucous brown color noted in pad Pruritus: no Dysuria: no Malodorous: no Urinary frequency: no Fevers: no Abdominal pain: no  Sexual activity: not sexually active  History of sexually  transmitted diseases: no Recent antibiotic use: no Context:  Treatments attempted: none   ELBOW PAIN Ongoing for a few week to lateral aspect left elbow, runs down towards hand sometimes and effects grip.  Is right handed.  Duration: weeks Location: elbow Mechanism of injury: unknown Onset: sudden Severity: 5/10  Quality:  dull, aching, and shooting Frequency: intermittent Radiation: no Aggravating factors: movement  Alleviating factors: nothing  Status: fluctuating Treatments attempted: none  Relief with NSAIDs?:  No NSAIDs Taken Swelling: no Redness: no  Warmth: no Trauma: no Chest pain: no  Shortness of breath: no  Fever: no Decreased sensation: no Paresthesias: no Weakness: no  HYPERLIPIDEMIA No current medications, last LDL 158.   Hyperlipidemia status: good compliance Aspirin:  no The 10-year ASCVD risk score (Arnett DK, et al., 2019) is: 4.8%   Values used to calculate the score:     Age: 54 years     Sex: Female     Is Non-Hispanic African American: Yes     Diabetic: No     Tobacco smoker: No     Systolic Blood Pressure: 956 mmHg     Is BP treated: No     HDL Cholesterol: 52 mg/dL     Total Cholesterol: 236 mg/dL Chest pain:  no Coronary artery disease:  no Family history CAD:  no Family history early  CAD:  no    PREDIABETES Last A1C in September 2021 = 6.2%, has gained 20 lbs due to foot injury over past months. Polydipsia/polyuria: no Visual disturbance: no Chest pain: no Paresthesias: no   BIPOLAR DISORDER: Continues on Wellbutrin and Risperdal with good control. Has been on Risperdal for 15 years.  Denies SI/HI.  She does endorse some occasional uncontrollable facial twitches, but these do not bother her.  AIMS score = 3. Mood status: stable Satisfied with current treatment?: yes Symptom severity: mild Duration of current treatment : chronic Side effects: no involuntary muscle movement eye movement started one year ago,  Medication  compliance: good compliance Psychotherapy/counseling: none Depressed mood: no Anxious mood: no Anhedonia: no Significant weight loss or gain: no Insomnia: none Fatigue: no Feelings of worthlessness or guilt: no Impaired concentration/indecisiveness: no Suicidal ideations: no Hopelessness: no Crying spells: no Depression screen [facility] 2/9 05/09/2021 09/20/2020 07/14/2019 07/01/2018 12/30/2017  Decreased Interest 0 0 0 0 0  Down, Depressed, Hopeless 0 0 0 0 0  PHQ - 2 Score 0 0 0 0 0  Altered sleeping 0 0 0 0 0  Tired, decreased energy 0 1 0 0 0  Change in appetite 0 1 0 0 0  Feeling bad or failure about yourself  0 0 0 0 0  Trouble concentrating 0 0 0 1 0  Moving slowly or fidgety/restless 0 0 0 0 0  Suicidal thoughts 0 0 0 0 0  PHQ-9 Score 0 2 0 1 0  Difficult doing work/chores Not difficult at all - - Not difficult at all -     Depression Screen done today and results listed below:  Depression screen [facility] 2/9 05/09/2021 09/20/2020 07/14/2019 07/01/2018 12/30/2017  Decreased Interest 0 0 0 0 0  Down, Depressed, Hopeless 0 0 0 0 0  PHQ - 2 Score 0 0 0 0 0  Altered sleeping 0 0 0 0 0  Tired, decreased energy 0 1 0 0 0  Change in appetite 0 1 0 0 0  Feeling bad or failure about yourself  0 0 0 0 0  Trouble concentrating 0 0 0 1 0  Moving slowly or fidgety/restless 0 0 0 0 0  Suicidal thoughts 0 0 0 0 0  PHQ-9 Score 0 2 0 1 0  Difficult doing work/chores Not difficult at all - - Not difficult at all -   Fall Risk 12/19/2016 12/30/2017 07/01/2018 05/09/2021 05/09/2021  Falls in the past year? No No 0 0 0  Was there an injury with Fall? - - 0 0 0  Fall Risk Category Calculator - - 0 0 0  Fall Risk Category - - Low Low Low  Patient Fall Risk Level - - Low fall risk Low fall risk Low fall risk  Patient at Risk for Falls Due to - - - No Fall Risks No Fall Risks  Fall risk Follow up - - Falls evaluation completed Falls evaluation completed Falls prevention discussed    Functional Status  Survey: Is the patient deaf or have difficulty hearing?: No Does the patient have difficulty seeing, even when wearing glasses/contacts?: No Does the patient have difficulty concentrating, remembering, or making decisions?: No Does the patient have difficulty walking or climbing stairs?: No Does the patient have difficulty dressing or bathing?: No Does the patient have difficulty doing errands alone such as visiting a doctor's office or shopping?: No   Past Medical History:  Past Medical History:  Diagnosis Date   Bipolar 1  disorder ([place])    Depression    Hypertension     Surgical History:  Past Surgical History:  Procedure Laterality Date   ABDOMINAL HYSTERECTOMY     APPENDECTOMY     COLONOSCOPY WITH PROPOFOL N/A 02/14/2016   Procedure: COLONOSCOPY WITH PROPOFOL;  Surgeon: Lucilla Lame, MD;  Location: [facility] ENDOSCOPY;  Service: Endoscopy;  Laterality: N/A;   FOOT SURGERY Left 05/2020   JOINT REPLACEMENT Left    Partial     Medications:  Current Outpatient Medications on File Prior to Visit  Medication Sig   buPROPion (WELLBUTRIN) 100 MG tablet Take 1 tablet (100 mg total) by mouth daily.   risperiDONE (RISPERDAL) 3 MG tablet Take 1 tablet by mouth once daily   No current facility-administered medications on file prior to visit.    Allergies:  Allergies  Allergen Reactions   Mushroom Extract Complex     Social History:  Social History   Socioeconomic History   Marital status: Married    Spouse name: Not on file   Number of children: Not on file   Years of education: Not on file   Highest education level: Not on file  Occupational History   Not on file  Tobacco Use   Smoking status: Never   Smokeless tobacco: Never  Vaping Use   Vaping Use: Never used  Substance and Sexual Activity   Alcohol use: No   Drug use: No   Sexual activity: Yes  Other Topics Concern   Not on file  Social History Narrative   Not on file   Social Determinants of Health    Financial Resource Strain: Not on file  Food Insecurity: Not on file  Transportation Needs: Not on file  Physical Activity: Not on file  Stress: Not on file  Social Connections: Not on file  Intimate Partner Violence: Not on file   Social History   Tobacco Use  Smoking Status Never  Smokeless Tobacco Never   Social History   Substance and Sexual Activity  Alcohol Use No    Family History:  Family History  Problem Relation Age of Onset   Hypertension Mother    Cancer Mother        ovarian   Ovarian cancer Mother 4   Hypertension Son    Heart disease Maternal Grandfather    Breast cancer Maternal Aunt 30    Past medical history, surgical history, medications, allergies, family history and social history reviewed with patient today and changes made to appropriate areas of the chart.   Review of Systems  Constitutional:  Negative for chills, fever, malaise/fatigue and weight loss.  HENT:  Negative for hearing loss and tinnitus.   Eyes:  Negative for blurred vision, double vision and pain.  Respiratory:  Negative for cough and shortness of breath.   Cardiovascular:  Negative for chest pain, palpitations and leg swelling.  Gastrointestinal:  Negative for blood in stool, constipation, diarrhea, heartburn, nausea and vomiting.  Genitourinary:  Negative for flank pain, frequency and urgency.  Musculoskeletal:  Positive for joint pain. Negative for falls and myalgias.  Skin: Negative.   Neurological:  Negative for dizziness, tingling and headaches.  Endo/Heme/Allergies:  Negative for polydipsia.  Psychiatric/Behavioral:  Negative for depression and suicidal ideas. The patient is not nervous/anxious and does not have insomnia.    All other ROS negative except what is listed above and in the HPI.      Objective:    BP 135/84    Pulse (!) 54  Temp 97.8 F (36.6 C) (Oral)    Ht 5' 6.5" (1.689 m)    Wt 248 lb 6.4 oz (112.7 kg)    LMP  (LMP Unknown)    SpO2 98%    BMI  39.49 kg/m   Wt Readings from Last 3 Encounters:  05/09/21 248 lb 6.4 oz (112.7 kg)  09/20/20 250 lb 12.8 oz (113.8 kg)  03/21/20 240 lb 6 oz (109 kg)    Physical Exam Constitutional:      Appearance: Normal appearance. She is well-groomed and overweight.  HENT:     Head: Normocephalic and atraumatic.     Salivary Glands: Right salivary gland is not diffusely enlarged or tender. Left salivary gland is not diffusely enlarged or tender.     Nose: Nose normal.     Mouth/Throat:     Mouth: Mucous membranes are moist.  Eyes:     General: Lids are normal. No scleral icterus.    Conjunctiva/sclera: Conjunctivae normal.     Comments: Involuntary eye twitching  Neck:     Thyroid: No thyroid mass, thyromegaly or thyroid tenderness.     Vascular: No carotid bruit.  Cardiovascular:     Rate and Rhythm: Normal rate and regular rhythm.     Pulses:          Dorsalis pedis pulses are 2+ on the right side.       Posterior tibial pulses are 1+ on the right side.     Heart sounds: Normal heart sounds. No murmur heard. Pulmonary:     Effort: Pulmonary effort is normal. No accessory muscle usage or respiratory distress.     Breath sounds: Normal breath sounds. No wheezing or rhonchi.  Abdominal:     General: Abdomen is protuberant. Bowel sounds are normal.     Palpations: Abdomen is soft.     Tenderness: There is no abdominal tenderness. There is no right CVA tenderness or left CVA tenderness.  Musculoskeletal:        General: No swelling.     Right elbow: Normal.     Left elbow: Tenderness present.     Cervical back: Full passive range of motion without pain.     Right lower leg: No edema.     Left lower leg: No edema.  Lymphadenopathy:     Cervical: No cervical adenopathy.  Skin:    General: Skin is warm and dry.  Neurological:     Mental Status: She is alert and oriented to person, place, and time.  Psychiatric:        Attention and Perception: Attention and perception normal.         Mood and Affect: Mood and affect normal. Mood is not anxious or depressed.        Speech: Speech normal.        Behavior: Behavior normal. Behavior is cooperative.        Thought Content: Thought content normal.        Judgment: Judgment normal.   Results for orders placed or performed in visit on 05/09/21  WET PREP FOR [place], YEAST, CLUE   BLD  Result Value Ref Range   Trichomonas Exam Negative Negative   Yeast Exam Negative Negative   Clue Cell Exam Negative Negative  Bayer DCA Hb A1c Waived  Result Value Ref Range   HB A1C (BAYER DCA - WAIVED) 5.7 (H) 4.8 - 5.6 %  Microalbumin, Urine Waived  Result Value Ref Range   Microalb, Ur Waived 10 0 -  19 mg/L   Creatinine, Urine Waived 50 10 - 300 mg/dL   Microalb/Creat Ratio <30 <30 mg/g  Urinalysis, Routine w reflex microscopic  Result Value Ref Range   Specific Gravity, UA 1.010 1.005 - 1.030   pH, UA 6.5 5.0 - 7.5   Color, UA Yellow Yellow   Appearance Ur Cloudy (A) Clear   Leukocytes,UA Negative Negative   Protein,UA Negative Negative/Trace   Glucose, UA Negative Negative   Ketones, UA Negative Negative   RBC, UA Negative Negative   Bilirubin, UA Negative Negative   Urobilinogen, Ur 0.2 0.2 - 1.0 mg/dL   Nitrite, UA Negative Negative      Assessment & Plan:   Problem List Items Addressed This Visit       Digestive   GERD (gastroesophageal reflux disease)    Continue with current diet at home.  Monitor for worsening symptoms and address as indicated.        Nervous and Auditory   Tardive dyskinesia    AIMS = 3 today with mild symptoms, discussed at length options -- she refuses to change medication regimen as has worked well for her for >15 years and kept Bipolar stable.  Discussed options, with first being discontinuation of Risperdal if ongoing or worsening.  Could also consider change to Clozapine or addition of Xenazine. Labs today to include Prolactin, CBC, CMP, lipid.        Other   Bipolar 2 disorder ([place])  - Primary    Chronic, stable.  Denies SI/HI.  Has been stable on current regimen for years.  Continue current medication regimen and adjust as needed.  Refills sent.  AIMS = 3 today, some mild tardive dyskinesia symptoms present, at this time she wants no medication adjustments and we will continue to monitor closely.  Check labs today.      Relevant Orders   CBC with Differential/Platelet   Comprehensive metabolic panel   TSH   Prolactin   Fecal smearing    Ongoing for several weeks since returned to work, ?pelvic floor dysfunction.  Will place referral for physical therapy to work on this.  If ongoing or worsening plan on referral to GI for further assessment and recommendations.  Labs today.      Relevant Orders   Ambulatory referral to Physical Therapy   Hyperlipidemia    Chronic, ongoing.  No current medications as ASCVD 4.8%.  Continue diet focus.  Lipid panel and CMP today.      Relevant Orders   Comprehensive metabolic panel   Lipid Panel w/o Chol/HDL Ratio   Left elbow pain    Lateral aspect, acute.  Recommend use of elbow support sleeve while working.  Take Tylenol 1000 MG TID as needed + apply Icy/Hot or Voltaren gel as needed.  May use ice to elbow.  If ongoing will plan on injection to elbow in upcoming weeks.      Obesity (BMI 35.0-39.9 without comorbidity)    BMI 39.49.  Recommended eating smaller high protein, low fat meals more frequently and exercising 30 mins a day 5 times a week with a goal of 10-15lb weight loss in the next 3 months. Patient voiced their understanding and motivation to adhere to these recommendations.       Prediabetes    Ongoing, continues to focus on diet changes.  A1c 5.7% today and urine ALB 10.  Continued focus on diabetic diet.  Initiate medication as needed.      Relevant Orders   [facility]  Hb A1c Waived (Completed)   Microalbumin, Urine Waived (Completed)   Urinary symptom or sign    Acute with UA and wet prep overall reassuring.  Suspect some pelvic floor dysfunction is presenting, discussed with patient.  Will place referral to physical therapy and if ongoing consider referral to GI, as having mild fecal incontinence too.        Relevant Orders   WET PREP FOR [place], YEAST, CLUE   Urinalysis, Routine w reflex microscopic (Completed)   Other Visit Diagnoses     Vitamin D deficiency       History of lows reported, check today and start supplement as needed.   Relevant Orders   VITAMIN D 25 Hydroxy (Vit-D Deficiency, Fractures)   Flu vaccine need       Flu vaccine today   Relevant Orders   Flu Vaccine QUAD 6+ mos PF IM (Fluarix Quad PF) (Completed)   Encounter for annual physical exam       Annual physical today with labs and health maintenance reviewed.        Follow up plan: Return in about 6 weeks (around 06/20/2021) for Stool incontinence.   LABORATORY TESTING:  - Pap smear: done elsewhere  IMMUNIZATIONS:   - Tdap: Tetanus vaccination status reviewed: last tetanus booster 2019 years ago. - Influenza: Administered today - Pneumovax: Not applicable - Prevnar: Not applicable - COVID: Refused - HPV: Not applicable - Shingrix vaccine: Refused  SCREENING: -Mammogram:  - Colonoscopy: Up to date due 2027 - Bone Density: Not applicable  -Hearing Test: Not applicable  -Spirometry: Not applicable   PATIENT COUNSELING:    Diet: Encouraged to adjust caloric intake to maintain  or achieve ideal body weight, to reduce intake of dietary saturated fat and total fat, to limit sodium intake by avoiding high sodium foods and not adding table salt, and to maintain adequate dietary potassium and calcium preferably from fresh fruits, vegetables, and low-fat dairy products.    stressed the importance of regular exercise  Injury prevention: Discussed safety belts, safety helmets, smoke detector, smoking near bedding or upholstery.   Dental health: Discussed importance of regular tooth brushing, flossing, and  dental visits.    NEXT PREVENTATIVE PHYSICAL DUE IN 1 YEAR. Return in about 6 weeks (around 06/20/2021) for Stool incontinence.

## 2021-05-09 NOTE — Assessment & Plan Note (Signed)
Acute with UA and wet prep overall reassuring. Suspect some pelvic floor dysfunction is presenting, discussed with patient.  Will place referral to physical therapy and if ongoing consider referral to GI, as having mild fecal incontinence too.

## 2021-05-09 NOTE — Assessment & Plan Note (Signed)
Lateral aspect, acute.  Recommend use of elbow support sleeve while working.  Take Tylenol 1000 MG TID as needed + apply Icy/Hot or Voltaren gel as needed.  May use ice to elbow.  If ongoing will plan on injection to elbow in upcoming weeks.

## 2021-05-09 NOTE — Assessment & Plan Note (Addendum)
Chronic, stable.  Denies SI/HI.  Has been stable on current regimen for years.  Continue current medication regimen and adjust as needed.  Refills sent.  AIMS = 3 today, some mild tardive dyskinesia symptoms present, at this time she wants no medication adjustments and we will continue to monitor closely.  Check labs today.

## 2021-05-09 NOTE — Assessment & Plan Note (Signed)
AIMS = 3 today with mild symptoms, discussed at length options -- she refuses to change medication regimen as has worked well for her for >15 years and kept Bipolar stable.  Discussed options, with first being discontinuation of Risperdal if ongoing or worsening.  Could also consider change to Clozapine or addition of Xenazine. Labs today to include Prolactin, CBC, CMP, lipid.

## 2021-05-09 NOTE — Assessment & Plan Note (Signed)
Chronic, ongoing.  No current medications as ASCVD 4.8%.  Continue diet focus.  Lipid panel and CMP today.

## 2021-05-09 NOTE — Assessment & Plan Note (Addendum)
Ongoing for several weeks since returned to work, ?pelvic floor dysfunction.  Will place referral for physical therapy to work on this.  If ongoing or worsening plan on referral to GI for further assessment and recommendations.  Labs today.

## 2021-05-09 NOTE — Assessment & Plan Note (Signed)
Continue with current diet at home.  Monitor for worsening symptoms and address as indicated.

## 2021-05-09 NOTE — Assessment & Plan Note (Signed)
Ongoing, continues to focus on diet changes.  A1c 5.7% today and urine ALB 10.  Continued focus on diabetic diet.  Initiate medication as needed.

## 2021-05-10 LAB — COMPREHENSIVE METABOLIC PANEL
ALT: 24 IU/L (ref 0–32)
AST: 23 IU/L (ref 0–40)
Albumin/Globulin Ratio: 1.6 (ref 1.2–2.2)
Albumin: 4.5 g/dL (ref 3.8–4.9)
Alkaline Phosphatase: 130 IU/L — ABNORMAL HIGH (ref 44–121)
BUN/Creatinine Ratio: 9 (ref 9–23)
BUN: 7 mg/dL (ref 6–24)
Bilirubin Total: 0.5 mg/dL (ref 0.0–1.2)
CO2: 24 mmol/L (ref 20–29)
Calcium: 9.7 mg/dL (ref 8.7–10.2)
Chloride: 100 mmol/L (ref 96–106)
Creatinine, Ser: 0.75 mg/dL (ref 0.57–1.00)
Globulin, Total: 2.8 g/dL (ref 1.5–4.5)
Glucose: 81 mg/dL (ref 70–99)
Potassium: 4.3 mmol/L (ref 3.5–5.2)
Sodium: 138 mmol/L (ref 134–144)
Total Protein: 7.3 g/dL (ref 6.0–8.5)
eGFR: 94 mL/min/{1.73_m2} (ref >59)

## 2021-05-10 LAB — CBC WITH DIFFERENTIAL/PLATELET
Basophils Absolute: 0 10*3/uL (ref 0.0–0.2)
Basos: 1 %
EOS (ABSOLUTE): 0.2 10*3/uL (ref 0.0–0.4)
Eos: 4 %
Hematocrit: 38 % (ref 34.0–46.6)
Hemoglobin: 13.3 g/dL (ref 11.1–15.9)
Immature Grans (Abs): 0 10*3/uL (ref 0.0–0.1)
Immature Granulocytes: 0 %
Lymphocytes Absolute: 2.4 10*3/uL (ref 0.7–3.1)
Lymphs: 47 %
MCH: 29 pg (ref 26.6–33.0)
MCHC: 35 g/dL (ref 31.5–35.7)
MCV: 83 fL (ref 79–97)
Monocytes Absolute: 0.4 10*3/uL (ref 0.1–0.9)
Monocytes: 8 %
Neutrophils Absolute: 2.1 10*3/uL (ref 1.4–7.0)
Neutrophils: 40 %
Platelets: 245 10*3/uL (ref 150–450)
RBC: 4.58 x10E6/uL (ref 3.77–5.28)
RDW: 12.5 % (ref 11.7–15.4)
WBC: 5.1 10*3/uL (ref 3.4–10.8)

## 2021-05-10 LAB — TSH: TSH: 1.67 u[IU]/mL (ref 0.450–4.500)

## 2021-05-10 LAB — LIPID PANEL W/O CHOL/HDL RATIO
Cholesterol, Total: 250 mg/dL — ABNORMAL HIGH (ref 100–199)
HDL: 52 mg/dL (ref >39)
LDL Chol Calc (NIH): 181 mg/dL — ABNORMAL HIGH (ref 0–99)
Triglycerides: 95 mg/dL (ref 0–149)
VLDL Cholesterol Cal: 17 mg/dL (ref 5–40)

## 2021-05-10 LAB — VITAMIN D 25 HYDROXY (VIT D DEFICIENCY, FRACTURES): Vit D, 25-Hydroxy: 28.1 ng/mL — ABNORMAL LOW (ref 30.0–100.0)

## 2021-05-10 LAB — PROLACTIN: Prolactin: 17.8 ng/mL (ref 4.8–23.3)

## 2021-05-10 NOTE — Progress Notes (Signed)
Contacted via [place] The 10-year ASCVD risk score (Arnett DK, et al., 2019) is: 5%   Values used to calculate the score:     Age: 56 years     Sex: Female     Is Non-Hispanic African American: Yes     Diabetic: No     Tobacco smoker: No     Systolic Blood Pressure: 179 mmHg     Is BP treated: No     HDL Cholesterol: 52 mg/dL     Total Cholesterol: 250 mg/dL  Good morning Gabriella Hensley, your labs have returned.  Overall they look great with exception of cholesterol levels and Vitamin D levels.  I recommend ensuring you are taking Vitamin D3 2000 units daily.  For cholesterol LDL is 180, if this creeps up to 190 or greater or the ASCVD risk score, cardiac risk score I will list below, creeps up to 10% or greater I will recommend we start medication to lower levels.  Any questions? The 10-year ASCVD risk score (Arnett DK, et al., 2019) is: 5%   Values used to calculate the score:     Age: 39 years     Sex: Female     Is Non-Hispanic African American: Yes     Diabetic: No     Tobacco smoker: No     Systolic Blood Pressure: 150 mmHg     Is BP treated: No     HDL Cholesterol: 52 mg/dL     Total Cholesterol: 250 mg/dL Keep being inspiring!!  Thank you for allowing me to participate in your care.  I appreciate you. Kindest regards, Gabriella Hensley

## 2021-05-19 ENCOUNTER — Other Ambulatory Visit: Payer: Self-pay | Admitting: Nurse Practitioner

## 2021-05-19 DIAGNOSIS — Z1231 Encounter for screening mammogram for malignant neoplasm of breast: Secondary | ICD-10-CM

## 2021-06-17 NOTE — Patient Instructions (Signed)
Healthy Eating ?Following a healthy eating pattern may help you to achieve and maintain a healthy body weight, reduce the risk of chronic disease, and live a long and productive life. It is important to follow a healthy eating pattern at an appropriate calorie level for your body. Your nutritional needs should be met primarily through food by choosing a variety of nutrient-rich foods. ?What are tips for following this plan? ?Reading food labels ?Read labels and choose the following: ?Reduced or low sodium. ?Juices with 100% fruit juice. ?Foods with low saturated fats and high polyunsaturated and monounsaturated fats. ?Foods with whole grains, such as whole wheat, cracked wheat, brown rice, and wild rice. ?Whole grains that are fortified with folic acid. This is recommended for women who are pregnant or who want to become pregnant. ?Read labels and avoid the following: ?Foods with a lot of added sugars. These include foods that contain brown sugar, corn sweetener, corn syrup, dextrose, fructose, glucose, high-fructose corn syrup, honey, invert sugar, lactose, malt syrup, maltose, molasses, raw sugar, sucrose, trehalose, or turbinado sugar. ?Do not eat more than the following amounts of added sugar per day: ?6 teaspoons (25 g) for women. ?9 teaspoons (38 g) for men. ?Foods that contain processed or refined starches and grains. ?Refined grain products, such as white flour, degermed cornmeal, white bread, and white rice. ?Shopping ?Choose nutrient-rich snacks, such as vegetables, whole fruits, and nuts. Avoid high-calorie and high-sugar snacks, such as potato chips, fruit snacks, and candy. ?Use oil-based dressings and spreads on foods instead of solid fats such as butter, stick margarine, or cream cheese. ?Limit pre-made sauces, mixes, and "instant" products such as flavored rice, instant noodles, and ready-made pasta. ?Try more plant-protein sources, such as tofu, tempeh, black beans, edamame, lentils, nuts, and  seeds. ?Explore eating plans such as the Mediterranean diet or vegetarian diet. ?Cooking ?Use oil to saut? or stir-fry foods instead of solid fats such as butter, stick margarine, or lard. ?Try baking, boiling, grilling, or broiling instead of frying. ?Remove the fatty part of meats before cooking. ?Steam vegetables in water or broth. ?Meal planning ? ?At meals, imagine dividing your plate into fourths: ?One-half of your plate is fruits and vegetables. ?One-fourth of your plate is whole grains. ?One-fourth of your plate is protein, especially lean meats, poultry, eggs, tofu, beans, or nuts. ?Include low-fat dairy as part of your daily diet. ?Lifestyle ?Choose healthy options in all settings, including home, work, school, restaurants, or stores. ?Prepare your food safely: ?Wash your hands after handling raw meats. ?Keep food preparation surfaces clean by regularly washing with hot, soapy water. ?Keep raw meats separate from ready-to-eat foods, such as fruits and vegetables. ?Cook seafood, meat, poultry, and eggs to the recommended internal temperature. ?Store foods at safe temperatures. In general: ?Keep cold foods at 40?F (4.4?C) or below. ?Keep hot foods at 140?F (60?C) or above. ?Keep your freezer at 0?F (-17.8?C) or below. ?Foods are no longer safe to eat when they have been between the temperatures of 40?-140?F (4.4-60?C) for more than 2 hours. ?What foods should I eat? ?Fruits ?Aim to eat 2 cup-equivalents of fresh, canned (in natural juice), or frozen fruits each day. Examples of 1 cup-equivalent of fruit include 1 small apple, 8 large strawberries, 1 cup canned fruit, ? cup dried fruit, or 1 cup 100% juice. ?Vegetables ?Aim to eat 2?-3 cup-equivalents of fresh and frozen vegetables each day, including different varieties and colors. Examples of 1 cup-equivalent of vegetables include 2 medium carrots, 2 cups raw,  leafy greens, 1 cup chopped vegetable (raw or cooked), or 1 medium baked potato. ?Grains ?Aim to  eat 6 ounce-equivalents of whole grains each day. Examples of 1 ounce-equivalent of grains include 1 slice of bread, 1 cup ready-to-eat cereal, 3 cups popcorn, or ? cup cooked rice, pasta, or cereal. ?Meats and other proteins ?Aim to eat 5-6 ounce-equivalents of protein each day. Examples of 1 ounce-equivalent of protein include 1 egg, 1/2 cup nuts or seeds, or 1 tablespoon (16 g) peanut butter. A cut of meat or fish that is the size of a deck of cards is about 3-4 ounce-equivalents. ?Of the protein you eat each week, try to have at least 8 ounces come from seafood. This includes salmon, trout, herring, and anchovies. ?Dairy ?Aim to eat 3 cup-equivalents of fat-free or low-fat dairy each day. Examples of 1 cup-equivalent of dairy include 1 cup (240 mL) milk, 8 ounces (250 g) yogurt, 1? ounces (44 g) natural cheese, or 1 cup (240 mL) fortified soy milk. ?Fats and oils ?Aim for about 5 teaspoons (21 g) per day. Choose monounsaturated fats, such as canola and olive oils, avocados, peanut butter, and most nuts, or polyunsaturated fats, such as sunflower, corn, and soybean oils, walnuts, pine nuts, sesame seeds, sunflower seeds, and flaxseed. ?Beverages ?Aim for six 8-oz glasses of water per day. Limit coffee to three to five 8-oz cups per day. ?Limit caffeinated beverages that have added calories, such as soda and energy drinks. ?Limit alcohol intake to no more than 1 drink a day for nonpregnant women and 2 drinks a day for men. One drink equals 12 oz of beer (355 mL), 5 oz of wine (148 mL), or 1? oz of hard liquor (44 mL). ?Seasoning and other foods ?Avoid adding excess amounts of salt to your foods. Try flavoring foods with herbs and spices instead of salt. ?Avoid adding sugar to foods. ?Try using oil-based dressings, sauces, and spreads instead of solid fats. ?This information is based on general [place] nutrition guidelines. For more information, visit [employer]es. Exact amounts may vary based on your nutrition  needs. ?Summary ?A healthy eating plan may help you to maintain a healthy weight, reduce the risk of chronic diseases, and stay active throughout your life. ?Plan your meals. Make sure you eat the right portions of a variety of nutrient-rich foods. ?Try baking, boiling, grilling, or broiling instead of frying. ?Choose healthy options in all settings, including home, work, school, restaurants, or stores. ?This information is not intended to replace advice given to you by your health care provider. Make sure you discuss any questions you have with your health care provider. ?Document Revised: 11/08/2020 Document Reviewed: 11/08/2020 ?Elsevier Patient Education ? [place]. ? ?

## 2021-06-19 ENCOUNTER — Ambulatory Visit
Admission: RE | Admit: 2021-06-19 | Discharge: 2021-06-19 | Disposition: A | Discharge status: Home / Self Care | Payer: BC Managed Care – PPO | Source: Home / Self Care | Attending: Nurse Practitioner | Admitting: Nurse Practitioner

## 2021-06-19 ENCOUNTER — Ambulatory Visit (INDEPENDENT_AMBULATORY_CARE_PROVIDER_SITE_OTHER): Payer: BC Managed Care – PPO | Admitting: Nurse Practitioner

## 2021-06-19 ENCOUNTER — Ambulatory Visit
Admission: RE | Admit: 2021-06-19 | Discharge: 2021-06-19 | Disposition: A | Discharge status: Home / Self Care | Payer: BC Managed Care – PPO | Source: Ambulatory Visit | Attending: Nurse Practitioner | Admitting: Nurse Practitioner

## 2021-06-19 ENCOUNTER — Other Ambulatory Visit: Payer: Self-pay

## 2021-06-19 ENCOUNTER — Encounter: Payer: Self-pay | Admitting: Nurse Practitioner

## 2021-06-19 VITALS — BP 133/82 | HR 64 | Temp 97.7°F | Ht 66.5 in | Wt 240.6 lb

## 2021-06-19 DIAGNOSIS — M25522 Pain in left elbow: Secondary | ICD-10-CM | POA: Diagnosis not present

## 2021-06-19 DIAGNOSIS — R151 Fecal smearing: Secondary | ICD-10-CM | POA: Diagnosis not present

## 2021-06-19 DIAGNOSIS — M5416 Radiculopathy, lumbar region: Secondary | ICD-10-CM | POA: Insufficient documentation

## 2021-06-19 DIAGNOSIS — R200 Anesthesia of skin: Secondary | ICD-10-CM | POA: Diagnosis not present

## 2021-06-19 DIAGNOSIS — M545 Low back pain, unspecified: Secondary | ICD-10-CM | POA: Diagnosis not present

## 2021-06-19 DIAGNOSIS — Z1231 Encounter for screening mammogram for malignant neoplasm of breast: Secondary | ICD-10-CM | POA: Insufficient documentation

## 2021-06-19 NOTE — Assessment & Plan Note (Signed)
Lateral, inner, aspect, acute.  Recommend use of elbow support sleeve while working.  Take Tylenol 1000 MG TID as needed + apply Icy/Hot or Voltaren gel as needed.  May use ice to elbow.  If ongoing will plan on injection to elbow in upcoming weeks. ?

## 2021-06-19 NOTE — Assessment & Plan Note (Signed)
Ongoing for several weeks since returned to work, ?pelvic floor dysfunction.  Placed referral for physical therapy last visit, but she never heard from anyone -- will placed new referral for this and lower back.  Due to poor bowel pattern, recommend daily Metamucil to start.  If ongoing or worsening plan on referral to GI for further assessment and recommendations.   ?

## 2021-06-19 NOTE — Progress Notes (Signed)
? ?BP 133/82   Pulse 64 Comment: apical  Temp 97.7 ?F (36.5 ?C) (Oral)   Ht 5' 6.5" (1.689 m)   Wt 240 lb 9.6 oz (109.1 kg)   LMP  (LMP Unknown)   SpO2 98%   BMI 38.25 kg/m?   ? ?Subjective:  ? ? Patient ID: Gabriella Hensley, female    DOB: Sep 11, 1965, 56 y.o.   MRN: [id number] ? ?HPI: ?Gabriella Hensley is a 56 y.o. female ? ?Chief Complaint  ?Patient presents with  ? Stool Incontinence  ?  Patient is here to follow up on stool incontinence. Patient states everything is going well.   ? Elbow Pain  ? Numbness  ?  Patient states she can be sitting in her recliner or get up out of the bed. Her leg is always numb or always going numb. Patient states she feels as if something is going on with her left side of her body. Patient states she will have left elbow pain. Patient says she first noticed it about a month ago.   ? ?STOOL INCONTINENCE ?Follow-up today for this.  A physical therapy referral was placed last visit to work on pelvic floor -- she has not heard from anyone to schedule.  Did have a recent stomach virus and reports this caused some issues, but then it improved.  However, leakage did return -- notices only after she has a bowel movement.  Does not have regular bowel pattern. ? ?Reports mild issues with stool leakage she can not control and occasional urinary leakage since returning to work over past months.  Has 2 children, both c-section delivered.  Does feel pressure at times in the vaginal area. Last colonoscopy 2017. ?Duration:months ?Onset: gradual ?Frequency: constant ?Alleviating factors:  ?Aggravating factors: ?Status: worse ?Treatments attempted: none ?Fever: no ?Nausea: no ?Vomiting: no ?Weight loss: no ?Decreased appetite: no ?Diarrhea: no ?Constipation: no ?Blood in stool: no ?Heartburn: no ?Jaundice: no ?Rash: no ?Dysuria/urinary frequency: no ?Hematuria: no ?History of sexually transmitted disease: no ?Recurrent NSAID use: no  ? ?ELBOW PAIN AND NUMBNESS ?Started with left elbow pain about one  month ago and endorses she is also having left lower extremity numbness.  Has history of surgery to left foot on 06/17/20 + year before that had knee surgery.  When she sits in her recliner from her buttocks to her left leg will go numb. ?Duration: weeks ?Location: elbow ?Mechanism of injury: unknown ?Onset: sudden ?Severity: 2/10 at present 8/10 at worst ?Quality:  dull, aching, and throbbing ?Frequency: intermittent ?Radiation: no ?Aggravating factors: movement , when lifting something heavy ?Alleviating factors: elbow wrap  ?Status: stable ?Treatments attempted: ibuprofen  ?Relief with NSAIDs?:  No NSAIDs Taken ?Swelling: no ?Redness: no  ?Warmth: no ?Trauma: no ?Chest pain: no  ?Shortness of breath: no  ?Fever: no ?Decreased sensation: no ?Paresthesias: yes ?Weakness: no  ? ?Relevant past medical, surgical, family and social history reviewed and updated as indicated. Interim medical history since our last visit reviewed. ?Allergies and medications reviewed and updated. ? ?Review of Systems  ?Constitutional:  Negative for activity change, appetite change, diaphoresis, fatigue and fever.  ?Respiratory:  Negative for cough, chest tightness and shortness of breath.   ?Cardiovascular:  Negative for chest pain, palpitations and leg swelling.  ?Gastrointestinal:  Negative for abdominal distention, abdominal pain, constipation, diarrhea, nausea and vomiting.  ?Musculoskeletal:  Positive for arthralgias.  ?Neurological: Negative.   ?Psychiatric/Behavioral: Negative.    ? ?Per HPI unless specifically indicated above ? ?   ?  Objective:  ?  ?BP 133/82   Pulse 64 Comment: apical  Temp 97.7 ?F (36.5 ?C) (Oral)   Ht 5' 6.5" (1.689 m)   Wt 240 lb 9.6 oz (109.1 kg)   LMP  (LMP Unknown)   SpO2 98%   BMI 38.25 kg/m?   ?Wt Readings from Last 3 Encounters:  ?06/19/21 240 lb 9.6 oz (109.1 kg)  ?05/09/21 248 lb 6.4 oz (112.7 kg)  ?09/20/20 250 lb 12.8 oz (113.8 kg)  ?  ?Physical Exam ?Vitals and nursing note reviewed.   ?Constitutional:   ?   General: She is awake. She is not in acute distress. ?   Appearance: She is well-developed and well-groomed. She is obese. She is not ill-appearing or toxic-appearing.  ?HENT:  ?   Head: Normocephalic.  ?   Right Ear: Hearing normal.  ?   Left Ear: Hearing normal.  ?Eyes:  ?   General: Lids are normal.     ?   Right eye: No discharge.     ?   Left eye: No discharge.  ?   Conjunctiva/sclera: Conjunctivae normal.  ?   Pupils: Pupils are equal, round, and reactive to light.  ?Neck:  ?   Thyroid: No thyromegaly.  ?   Vascular: No carotid bruit.  ?Cardiovascular:  ?   Rate and Rhythm: Normal rate and regular rhythm.  ?   Heart sounds: Normal heart sounds. No murmur heard. ?  No gallop.  ?Pulmonary:  ?   Effort: Pulmonary effort is normal. No accessory muscle usage or respiratory distress.  ?   Breath sounds: Normal breath sounds.  ?Abdominal:  ?   General: Bowel sounds are normal.  ?   Palpations: Abdomen is soft. There is no hepatomegaly or splenomegaly.  ?Musculoskeletal:  ?   Right elbow: Normal.  ?   Left elbow: No swelling, effusion or lacerations. Normal range of motion. Tenderness (to inner lateral aspect, tendon tight to area) present.  ?   Cervical back: Normal range of motion and neck supple.  ?   Thoracic back: No swelling, edema, signs of trauma, spasms or tenderness. Normal range of motion. Scoliosis present.  ?   Lumbar back: Tenderness (to left gluteus and left SI area) present. No swelling, edema, signs of trauma or spasms. Normal range of motion. Negative right straight leg raise test and negative left straight leg raise test.  ?   Right lower leg: No edema.  ?   Left lower leg: No edema.  ?Lymphadenopathy:  ?   Cervical: No cervical adenopathy.  ?Skin: ?   General: Skin is warm and dry.  ?Neurological:  ?   Mental Status: She is alert and oriented to person, place, and time.  ?Psychiatric:     ?   Attention and Perception: Attention normal.     ?   Mood and Affect: Mood normal.      ?   Speech: Speech normal.     ?   Behavior: Behavior normal. Behavior is cooperative.     ?   Thought Content: Thought content normal.  ? ? ?Results for orders placed or performed in visit on 05/09/21  ?WET PREP FOR [place], YEAST, CLUE  ? BLD  ?Result Value Ref Range  ? Trichomonas Exam Negative Negative  ? Yeast Exam Negative Negative  ? Clue Cell Exam Negative Negative  ?Bayer DCA Hb A1c Waived  ?Result Value Ref Range  ? HB A1C (BAYER DCA - WAIVED) 5.7 (H) 4.8 -  5.6 %  ?Microalbumin, Urine Waived  ?Result Value Ref Range  ? Microalb, Ur Waived 10 0 - 19 mg/L  ? Creatinine, Urine Waived 50 10 - 300 mg/dL  ? Microalb/Creat Ratio <30 <30 mg/g  ?Urinalysis, Routine w reflex microscopic  ?Result Value Ref Range  ? Specific Gravity, UA 1.010 1.005 - 1.030  ? pH, UA 6.5 5.0 - 7.5  ? Color, UA Yellow Yellow  ? Appearance Ur Cloudy (A) Clear  ? Leukocytes,UA Negative Negative  ? Protein,UA Negative Negative/Trace  ? Glucose, UA Negative Negative  ? Ketones, UA Negative Negative  ? RBC, UA Negative Negative  ? Bilirubin, UA Negative Negative  ? Urobilinogen, Ur 0.2 0.2 - 1.0 mg/dL  ? Nitrite, UA Negative Negative  ?CBC with Differential/Platelet  ?Result Value Ref Range  ? WBC 5.1 3.4 - 10.8 x10E3/uL  ? RBC 4.58 3.77 - 5.28 x10E6/uL  ? Hemoglobin 13.3 11.1 - 15.9 g/dL  ? Hematocrit 38.0 34.0 - 46.6 %  ? MCV 83 79 - 97 fL  ? MCH 29.0 26.6 - 33.0 pg  ? MCHC 35.0 31.5 - 35.7 g/dL  ? RDW 12.5 11.7 - 15.4 %  ? Platelets 245 150 - 450 x10E3/uL  ? Neutrophils 40 Not Estab. %  ? Lymphs 47 Not Estab. %  ? Monocytes 8 Not Estab. %  ? Eos 4 Not Estab. %  ? Basos 1 Not Estab. %  ? Neutrophils Absolute 2.1 1.4 - 7.0 x10E3/uL  ? Lymphocytes Absolute 2.4 0.7 - 3.1 x10E3/uL  ? Monocytes Absolute 0.4 0.1 - 0.9 x10E3/uL  ? EOS (ABSOLUTE) 0.2 0.0 - 0.4 x10E3/uL  ? Basophils Absolute 0.0 0.0 - 0.2 x10E3/uL  ? Immature Granulocytes 0 Not Estab. %  ? Immature Grans (Abs) 0.0 0.0 - 0.1 x10E3/uL  ?Comprehensive metabolic panel  ?Result Value  Ref Range  ? Glucose 81 70 - 99 mg/dL  ? BUN 7 6 - 24 mg/dL  ? Creatinine, Ser 0.75 0.57 - 1.00 mg/dL  ? eGFR 94 >59 mL/min/1.73  ? BUN/Creatinine Ratio 9 9 - 23  ? Sodium 138 134 - 144 mmol/L  ? Potassium 4.3 3.5 - 5.2 mmol/L

## 2021-06-19 NOTE — Progress Notes (Signed)
Contacted via MyChart   Normal mammogram, may repeat in one year:)

## 2021-06-19 NOTE — Assessment & Plan Note (Addendum)
Ongoing, obtain imaging lumbar spine -- there is some mild scoliosis noted on exam.  Recommend gentle stretching at home and will add lower back to PT referral to work on stretching and core strength.  Would benefit from modest weight loss, discussed with patient.  Tylenol and Voltaren gel as needed at home + recommend TENS machine.  May benefit from low dose Gabapentin in future. Return in 8 weeks. ?

## 2021-06-20 ENCOUNTER — Encounter: Payer: Self-pay | Admitting: Nurse Practitioner

## 2021-06-20 DIAGNOSIS — M461 Sacroiliitis, not elsewhere classified: Secondary | ICD-10-CM | POA: Insufficient documentation

## 2021-06-20 NOTE — Progress Notes (Signed)
Contacted via [place] ? ? ?Good evening Gabriella Hensley, your imaging has returned.  There are arthritic changes noted to lumbar (lower) back and the facet joints are showing some enlargement which can lead to a pinched nerve.  You also have some inflammation of the left SI joint, which could also be causing some of your symptoms.  Have you taken a Prednisone taper before and did you tolerate this?  You may benefit from this to help inflammation and pain.  I also recommend some physical therapy, if interested.  Please let me know.  Any questions? ?Keep being stellar!!  Thank you for allowing me to participate in your care.  I appreciate you. ?Kindest regards, ?Jaycion Treml ?

## 2021-06-21 ENCOUNTER — Encounter: Payer: Self-pay | Admitting: Nurse Practitioner

## 2021-06-22 MED ORDER — PREDNISONE 10 MG PO TABS: ORAL_TABLET | ORAL | 0 refills | Status: DC

## 2021-08-13 NOTE — Patient Instructions (Addendum)
Living With Sleep Apnea Sleep apnea is a condition in which breathing pauses or becomes shallow during sleep. Sleep apnea is most commonly caused by a collapsed or blocked airway. People with sleep apnea usually snore loudly. They may have times when they gasp and stop breathing for 10 seconds or more during sleep. This may happen many times during the night. The breaks in breathing also interrupt the deep sleep that you need to feel rested. Even if you do not completely wake up from the gaps in breathing, your sleep may not be restful and you feel tired during the day. You may also have a headache in the morning and low energy during the day, and you may feel anxious or depressed. How can sleep apnea affect me? Sleep apnea increases your chances of extreme tiredness during the day (daytime fatigue). It can also increase your risk for health conditions, such as: Heart attack. Stroke. Obesity. Type 2 diabetes. Heart failure. Irregular heartbeat. High blood pressure. If you have daytime fatigue as a result of sleep apnea, you may be more likely to: Perform poorly at school or work. Fall asleep while driving. Have difficulty with attention. Develop depression or anxiety. Have sexual dysfunction. What actions can I take to manage sleep apnea? Sleep apnea treatment  If you were given a device to open your airway while you sleep, use it only as told by your health care provider. You may be given: An oral appliance. This is a custom-made mouthpiece that shifts your lower jaw forward. A continuous positive airway pressure (CPAP) device. This device blows air through a mask when you breathe out (exhale). A nasal expiratory positive airway pressure (EPAP) device. This device has valves that you put into each nostril. A bi-level positive airway pressure (BIPAP) device. This device blows air through a mask when you breathe in (inhale) and breathe out (exhale). You may need surgery if other treatments  do not work for you. Sleep habits Go to sleep and wake up at the same time every day. This helps set your internal clock (circadian rhythm) for sleeping. If you stay up later than usual, such as on weekends, try to get up in the morning within 2 hours of your normal wake time. Try to get at least 7-9 hours of sleep each night. Stop using a computer, tablet, and mobile phone a few hours before bedtime. Do not take long naps during the day. If you nap, limit it to 30 minutes. Have a relaxing bedtime routine. Reading or listening to music may relax you and help you sleep. Use your bedroom only for sleep. Keep your television and computer out of your bedroom. Keep your bedroom cool, dark, and quiet. Use a supportive mattress and pillows. Follow your health care provider's instructions for other changes to sleep habits. Nutrition Do not eat heavy meals in the evening. Do not have caffeine in the later part of the day. The effects of caffeine can last for more than 5 hours. Follow your health care provider's or dietitian's instructions for any diet changes. Lifestyle     Do not drink alcohol before bedtime. Alcohol can cause you to fall asleep at first, but then it can cause you to wake up in the middle of the night and have trouble getting back to sleep. Do not use any products that contain nicotine or tobacco. These products include cigarettes, chewing tobacco, and vaping devices, such as e-cigarettes. If you need help quitting, ask your health care provider. Medicines Take   over-the-counter and prescription medicines only as told by your health care provider. Do not use over-the-counter sleep medicine. You can become dependent on this medicine, and it can make sleep apnea worse. Do not use medicines, such as sedatives and narcotics, unless told by your health care provider. Activity Exercise on most days, but avoid exercising in the evening. Exercising near bedtime can interfere with  sleeping. If possible, spend time outside every day. Natural light helps regulate your circadian rhythm. General information Lose weight if you need to, and maintain a healthy weight. Keep all follow-up visits. This is important. If you are having surgery, make sure to tell your health care provider that you have sleep apnea. You may need to bring your device with you. Where to find more information Learn more about sleep apnea and daytime fatigue from: American Sleep Association: sleepassociation.org National Sleep Foundation: sleepfoundation.org National Heart, Lung, and Blood Institute: nhlbi.nih.gov Summary Sleep apnea is a condition in which breathing pauses or becomes shallow during sleep. Sleep apnea can cause daytime fatigue and other serious health conditions. You may need to wear a device while sleeping to help keep your airway open. If you are having surgery, make sure to tell your health care provider that you have sleep apnea. You may need to bring your device with you. Making changes to sleep habits, diet, lifestyle, and activity can help you manage sleep apnea. This information is not intended to replace advice given to you by your health care provider. Make sure you discuss any questions you have with your health care provider. Document Revised: 10/19/2020 Document Reviewed: 02/19/2020 Elsevier Patient Education  2023 [employer]  

## 2021-08-14 ENCOUNTER — Ambulatory Visit (INDEPENDENT_AMBULATORY_CARE_PROVIDER_SITE_OTHER): Payer: BC Managed Care – PPO | Admitting: Nurse Practitioner

## 2021-08-14 ENCOUNTER — Encounter: Payer: Self-pay | Admitting: Nurse Practitioner

## 2021-08-14 VITALS — BP 120/79 | HR 52 | Temp 97.9°F | Ht 66.5 in | Wt 239.6 lb

## 2021-08-14 DIAGNOSIS — R0683 Snoring: Secondary | ICD-10-CM

## 2021-08-14 DIAGNOSIS — R151 Fecal smearing: Secondary | ICD-10-CM

## 2021-08-14 NOTE — Progress Notes (Signed)
BP 120/79   Pulse (!) 52   Temp 97.9 F (36.6 C) (Oral)   Ht 5' 6.5" (1.689 m)   Wt 239 lb 9.6 oz (108.7 kg)   LMP  (LMP Unknown)   SpO2 97%   BMI 38.09 kg/m    Subjective:    Patient ID: Gabriella Hensley, female    DOB: 07/18/1965, 56 y.o.   MRN: [id number]  HPI: Gabriella Hensley is a 56 y.o. female  Chief Complaint  Patient presents with   Encopresis   Breathing Problem    Patient states she would like to discuss an issue she has been having regarding her stop breathing when she is sleeping. Patient states her husband made her aware. Patient states she had a spot over her L eye and says her husband make think it came from the pressure when she stopped breathing due her sleep.    STOOL INCONTINENCE Follow-up today for leakage of stool.  Had placed referral for PT, but she has not heard to schedule.  Reports mild issues with stool leakage, which are improving with some weight loss, and no urine leakage at this time.  Does not notice leakage every day.  Has 2 children, both c-section delivered.  Does feel pressure at times in the vaginal area. Last colonoscopy 2017.  Does not have BM every day, has maybe 3 times a week.  No straining with these. Duration:months Onset: gradual Frequency: constant Status: better Treatments attempted: none Fever: no Nausea: no Vomiting: no Weight loss: no Decreased appetite: no Diarrhea: no Constipation: no Blood in stool: no Heartburn: no Jaundice: no Rash: no Dysuria/urinary frequency: no Hematuria: no History of sexually transmitted disease: no Recurrent NSAID use: no   SLEEP APNEA Her husband notices her stop breathing when she sleeps with snoring.  He shakes her to wake her.   Sleep apnea status: uncontrolled Duration: chronic Wakes feeling refreshed:   occasional -- some mornings wakes up with headache Daytime hypersomnolence:  no Fatigue:  no Insomnia:  no Good sleep hygiene:  yes Difficulty falling asleep:  no Difficulty  staying asleep:  no Snoring bothers bed partner:  yes Observed apnea by bed partner: yes Obesity:  yes Hypertension: no  Pulmonary hypertension:  no Coronary artery disease:  no   Relevant past medical, surgical, family and social history reviewed and updated as indicated. Interim medical history since our last visit reviewed. Allergies and medications reviewed and updated.  Review of Systems  Constitutional:  Negative for activity change, appetite change, diaphoresis, fatigue and fever.  Respiratory:  Negative for cough, chest tightness and shortness of breath.   Cardiovascular:  Negative for chest pain, palpitations and leg swelling.  Gastrointestinal:  Negative for abdominal distention, abdominal pain, constipation, diarrhea, nausea and vomiting.  Musculoskeletal:  Positive for arthralgias.  Neurological: Negative.   Psychiatric/Behavioral: Negative.     Per HPI unless specifically indicated above     Objective:    BP 120/79   Pulse (!) 52   Temp 97.9 F (36.6 C) (Oral)   Ht 5' 6.5" (1.689 m)   Wt 239 lb 9.6 oz (108.7 kg)   LMP  (LMP Unknown)   SpO2 97%   BMI 38.09 kg/m   Wt Readings from Last 3 Encounters:  08/14/21 239 lb 9.6 oz (108.7 kg)  06/19/21 240 lb 9.6 oz (109.1 kg)  05/09/21 248 lb 6.4 oz (112.7 kg)    Physical Exam Vitals and nursing note reviewed.  Constitutional:  General: She is awake. She is not in acute distress.    Appearance: She is well-developed and well-groomed. She is obese. She is not ill-appearing or toxic-appearing.  HENT:     Head: Normocephalic.     Right Ear: Hearing normal.     Left Ear: Hearing normal.     Mouth/Throat:     Comments: Mallampati Class III to IV Eyes:     General: Lids are normal.        Right eye: No discharge.        Left eye: No discharge.     Conjunctiva/sclera: Conjunctivae normal.     Pupils: Pupils are equal, round, and reactive to light.  Neck:     Thyroid: No thyromegaly.     Vascular: No carotid  bruit.  Cardiovascular:     Rate and Rhythm: Normal rate and regular rhythm.     Heart sounds: Normal heart sounds. No murmur heard.   No gallop.  Pulmonary:     Effort: Pulmonary effort is normal. No accessory muscle usage or respiratory distress.     Breath sounds: Normal breath sounds.  Abdominal:     General: Bowel sounds are normal.     Palpations: Abdomen is soft. There is no hepatomegaly or splenomegaly.  Musculoskeletal:     Cervical back: Normal range of motion and neck supple.     Right lower leg: No edema.     Left lower leg: No edema.  Lymphadenopathy:     Cervical: No cervical adenopathy.  Skin:    General: Skin is warm and dry.  Neurological:     Mental Status: She is alert and oriented to person, place, and time.  Psychiatric:        Attention and Perception: Attention normal.        Mood and Affect: Mood normal.        Speech: Speech normal.        Behavior: Behavior normal. Behavior is cooperative.        Thought Content: Thought content normal.   Results for orders placed or performed in visit on 05/09/21  WET PREP FOR [place], YEAST, CLUE   BLD  Result Value Ref Range   Trichomonas Exam Negative Negative   Yeast Exam Negative Negative   Clue Cell Exam Negative Negative  Bayer DCA Hb A1c Waived  Result Value Ref Range   HB A1C (BAYER DCA - WAIVED) 5.7 (H) 4.8 - 5.6 %  Microalbumin, Urine Waived  Result Value Ref Range   Microalb, Ur Waived 10 0 - 19 mg/L   Creatinine, Urine Waived 50 10 - 300 mg/dL   Microalb/Creat Ratio <30 <30 mg/g  Urinalysis, Routine w reflex microscopic  Result Value Ref Range   Specific Gravity, UA 1.010 1.005 - 1.030   pH, UA 6.5 5.0 - 7.5   Color, UA Yellow Yellow   Appearance Ur Cloudy (A) Clear   Leukocytes,UA Negative Negative   Protein,UA Negative Negative/Trace   Glucose, UA Negative Negative   Ketones, UA Negative Negative   RBC, UA Negative Negative   Bilirubin, UA Negative Negative   Urobilinogen, Ur 0.2 0.2 - 1.0  mg/dL   Nitrite, UA Negative Negative  CBC with Differential/Platelet  Result Value Ref Range   WBC 5.1 3.4 - 10.8 x10E3/uL   RBC 4.58 3.77 - 5.28 x10E6/uL   Hemoglobin 13.3 11.1 - 15.9 g/dL   Hematocrit 38.0 34.0 - 46.6 %   MCV 83 79 - 97 fL  MCH 29.0 26.6 - 33.0 pg   MCHC 35.0 31.5 - 35.7 g/dL   RDW 12.5 11.7 - 15.4 %   Platelets 245 150 - 450 x10E3/uL   Neutrophils 40 Not Estab. %   Lymphs 47 Not Estab. %   Monocytes 8 Not Estab. %   Eos 4 Not Estab. %   Basos 1 Not Estab. %   Neutrophils Absolute 2.1 1.4 - 7.0 x10E3/uL   Lymphocytes Absolute 2.4 0.7 - 3.1 x10E3/uL   Monocytes Absolute 0.4 0.1 - 0.9 x10E3/uL   EOS (ABSOLUTE) 0.2 0.0 - 0.4 x10E3/uL   Basophils Absolute 0.0 0.0 - 0.2 x10E3/uL   Immature Granulocytes 0 Not Estab. %   Immature Grans (Abs) 0.0 0.0 - 0.1 x10E3/uL  Comprehensive metabolic panel  Result Value Ref Range   Glucose 81 70 - 99 mg/dL   BUN 7 6 - 24 mg/dL   Creatinine, Ser 0.75 0.57 - 1.00 mg/dL   eGFR 94 >59 mL/min/1.73   BUN/Creatinine Ratio 9 9 - 23   Sodium 138 134 - 144 mmol/L   Potassium 4.3 3.5 - 5.2 mmol/L   Chloride 100 96 - 106 mmol/L   CO2 24 20 - 29 mmol/L   Calcium 9.7 8.7 - 10.2 mg/dL   Total Protein 7.3 6.0 - 8.5 g/dL   Albumin 4.5 3.8 - 4.9 g/dL   Globulin, Total 2.8 1.5 - 4.5 g/dL   Albumin/Globulin Ratio 1.6 1.2 - 2.2   Bilirubin Total 0.5 0.0 - 1.2 mg/dL   Alkaline Phosphatase 130 (H) 44 - 121 IU/L   AST 23 0 - 40 IU/L   ALT 24 0 - 32 IU/L  Lipid Panel w/o Chol/HDL Ratio  Result Value Ref Range   Cholesterol, Total 250 (H) 100 - 199 mg/dL   Triglycerides 95 0 - 149 mg/dL   HDL 52 >39 mg/dL   VLDL Cholesterol Cal 17 5 - 40 mg/dL   LDL Chol Calc (NIH) 181 (H) 0 - 99 mg/dL  TSH  Result Value Ref Range   TSH 1.670 0.450 - 4.500 uIU/mL  VITAMIN D 25 Hydroxy (Vit-D Deficiency, Fractures)  Result Value Ref Range   Vit D, 25-Hydroxy 28.1 (L) 30.0 - 100.0 ng/mL  Prolactin  Result Value Ref Range   Prolactin 17.8 4.8 - 23.3  ng/mL      Assessment & Plan:   Problem List Items Addressed This Visit       Other   Fecal smearing - Primary    Ongoing for several weeks since returned to work although symptoms improving at this time, ?pelvic floor dysfunction.  Placed referral for physical therapy last visit, but she never heard from anyone -- will check on this with referral coordinator.  Due to poor bowel pattern, recommend continue daily Metamucil.  If ongoing or worsening plan on referral to GI for further assessment and recommendations.         Loud snoring    Referral for sleep study placed, suspect some OSA present.       Relevant Orders   Ambulatory referral to Sleep Studies     Follow up plan: Return in about 4 weeks (around 09/11/2021) for OSA AND STOOL LEAKAGE.

## 2021-08-14 NOTE — Assessment & Plan Note (Signed)
Ongoing for several weeks since returned to work although symptoms improving at this time, ?pelvic floor dysfunction.  Placed referral for physical therapy last visit, but she never heard from anyone -- will check on this with referral coordinator.  Due to poor bowel pattern, recommend continue daily Metamucil.  If ongoing or worsening plan on referral to GI for further assessment and recommendations.

## 2021-08-14 NOTE — Assessment & Plan Note (Signed)
Referral for sleep study placed, suspect some OSA present.

## 2021-09-18 ENCOUNTER — Ambulatory Visit: Payer: BC Managed Care – PPO | Admitting: Nurse Practitioner

## 2021-10-09 ENCOUNTER — Encounter: Payer: Self-pay | Admitting: Neurology

## 2021-10-09 ENCOUNTER — Ambulatory Visit (INDEPENDENT_AMBULATORY_CARE_PROVIDER_SITE_OTHER): Payer: BC Managed Care – PPO | Admitting: Neurology

## 2021-10-09 VITALS — BP 105/69 | HR 59 | Ht 68.0 in | Wt 245.8 lb

## 2021-10-09 DIAGNOSIS — E669 Obesity, unspecified: Secondary | ICD-10-CM

## 2021-10-09 DIAGNOSIS — R519 Headache, unspecified: Secondary | ICD-10-CM

## 2021-10-09 DIAGNOSIS — G4719 Other hypersomnia: Secondary | ICD-10-CM | POA: Diagnosis not present

## 2021-10-09 DIAGNOSIS — R0681 Apnea, not elsewhere classified: Secondary | ICD-10-CM

## 2021-10-09 DIAGNOSIS — R0683 Snoring: Secondary | ICD-10-CM

## 2021-10-09 DIAGNOSIS — G4730 Sleep apnea, unspecified: Secondary | ICD-10-CM

## 2021-10-09 NOTE — Progress Notes (Signed)
Subjective:    Patient ID: Gabriella Hensley is a 56 y.o. female.  HPI    Star Age, MD, PhD [facility] [address], [address] [place], [place] [address]  Dear Henrine Screws,   I saw your patient, Gabriella Hensley, upon your kind request in my sleep clinic today for initial consultation of her sleep disorder, in particular, concern for underlying obstructive sleep apnea.  The patient is unaccompanied today.  As you know, [name] is a 56 year old right-handed woman with an underlying medical history of hypertension, depression, low vitamin D, elevated cholesterol, and obesity, who reports snoring and excessive daytime somnolence as well as witnessed apneas, per husband's report.  I reviewed your office note from 08/14/2021.  Her Epworth sleepiness score is 3 out of 24, fatigue severity score is 17 out of 63.  Bedtime is around 10 PM and rise time around 5:45 AM.  She works as a Corporate investment banker.  She has no family history of sleep apnea.  She lives with her husband and has 2 grown children, 3 grandchildren.  They have a small dog in the household.  She does have a TV in her bedroom and it tends to stay on at night per husband's preference.  She drinks caffeine in the form of soda or tea or coffee, generally speaking 3 servings per day, no alcohol, she is a non-smoker.  She has occasionally woken up with a headache.  She denies night to night nocturia.  She endorses chronic difficulty initiating sleep.  She has been on Risperdal at bedtime which helps her sleep.  She has not tried any other prescription sleep aid as per her report.  Her Past Medical History Is Significant For: Past Medical History:  Diagnosis Date   Bipolar 1 disorder ([place])    Depression    Hypertension     Her Past Surgical History Is Significant For: Past Surgical History:  Procedure Laterality Date   ABDOMINAL HYSTERECTOMY     APPENDECTOMY     COLONOSCOPY WITH PROPOFOL N/A 02/14/2016   Procedure:  COLONOSCOPY WITH PROPOFOL;  Surgeon: Lucilla Lame, MD;  Location: [facility] ENDOSCOPY;  Service: Endoscopy;  Laterality: N/A;   FOOT SURGERY Left 05/2020   JOINT REPLACEMENT Left    Partial     Her Family History Is Significant For: Family History  Problem Relation Age of Onset   Hypertension Mother    Cancer Mother        ovarian   Ovarian cancer Mother 49   Breast cancer Maternal Aunt 44   Heart disease Maternal Grandfather    Hypertension Son    Sleep apnea Neg Hx     Her Social History Is Significant For: Social History   Socioeconomic History   Marital status: Married    Spouse name: Not on file   Number of children: Not on file   Years of education: Not on file   Highest education level: Not on file  Occupational History   Not on file  Tobacco Use   Smoking status: Never   Smokeless tobacco: Never  Vaping Use   Vaping Use: Never used  Substance and Sexual Activity   Alcohol use: No   Drug use: No   Sexual activity: Yes  Other Topics Concern   Not on file  Social History Narrative   Not on file   Social Determinants of Health   Financial Resource Strain: Not on file  Food Insecurity: Not on file  Transportation Needs:  Not on file  Physical Activity: Not on file  Stress: Not on file  Social Connections: Not on file    Her Allergies Are:  Allergies  Allergen Reactions   Mushroom Extract Complex   :   Her Current Medications Are:  Outpatient Encounter Medications as of 10/09/2021  Medication Sig   buPROPion (WELLBUTRIN) 100 MG tablet Take 1 tablet (100 mg total) by mouth daily.   Cholecalciferol (VITAMIN D3 PO) Take by mouth daily.   Cyanocobalamin (B-12 PO) Take by mouth daily.   Multiple Vitamin (MULTIVITAMIN PO) Take by mouth daily.   risperiDONE (RISPERDAL) 3 MG tablet Take 1 tablet by mouth once daily   No facility-administered encounter medications on file as of 10/09/2021.  :   Review of Systems:  Out of a complete 14 point review of systems,  all are reviewed and negative with the exception of these symptoms as listed below:   Review of Systems  Neurological:        Pt here for sleep consult  Pt snores, fatigue ,headaches in am , stops breathing during the night . Pt denies hypertension ,sleep study,CPAP machine      ESS :17 FSS:3    Objective:  Neurological Exam  Physical Exam Physical Examination:   Vitals:   10/09/21 1430  BP: 105/69  Pulse: (!) 59    General Examination: The patient is a very pleasant 56 y.o. female in no acute distress. She appears well-developed and well-nourished and well groomed.   HEENT: Normocephalic, atraumatic, pupils are equal, round and reactive to light, extraocular tracking is good without limitation to gaze excursion or nystagmus noted. Hearing is grossly intact. Face is symmetric with normal facial animation. Speech is clear with no dysarthria noted. There is no hypophonia. There is no lip, neck/head, jaw or voice tremor. Neck is supple with full range of passive and active motion. There are no carotid bruits on auscultation. Oropharynx exam reveals: mild mouth dryness, adequate dental hygiene and moderate airway crowding, due to tonsillar size of 1-2+ on the left, 1+ on the right, neck circumference of 16 inches.  She has mild overbite.  Tongue protrudes centrally and palate elevates symmetrically.  Chest: Clear to auscultation without wheezing, rhonchi or crackles noted.  Heart: S1+S2+0, regular and normal without murmurs, rubs or gallops noted.   Abdomen: Soft, non-tender and non-distended.  Extremities: There is trace pitting edema in the left distal lower extremity.    Skin: Warm and dry without trophic changes noted.   Musculoskeletal: exam reveals no obvious joint deformities, unremarkable surgical scar from left partial knee replacement.  She also reports prior injury to the left foot.  Neurologically:  Mental status: The patient is awake, alert and oriented in all 4  spheres. Her immediate and remote memory, attention, language skills and fund of knowledge are appropriate. There is no evidence of aphasia, agnosia, apraxia or anomia. Speech is clear with normal prosody and enunciation. Thought process is linear. Mood is normal and affect is normal.  Cranial nerves II - XII are as described above under HEENT exam.  Motor exam: Normal bulk, strength and tone is noted. There is no tremor, Romberg is negative. Reflexes are 2+ throughout. Fine motor skills and coordination: grossly intact.  Cerebellar testing: No dysmetria or intention tremor. There is no truncal or gait ataxia.  Sensory exam: intact to light touch in the upper and lower extremities.  Gait, station and balance: She stands easily. No veering to one side is noted. No  leaning to one side is noted. Posture is age-appropriate and stance is narrow based. Gait shows normal stride length and normal pace. No problems turning are noted.   Assessment and Plan:  In summary, Jacquline LUWANDA STARR is a very pleasant 56 y.o.-year old female  with an underlying medical history of hypertension, depression, low vitamin D, elevated cholesterol, and obesity, whose history and physical exam concerning for sleep disordered breathing, supporting a current working diagnosis of unspecified sleep apnea, with the main differential diagnoses of obstructive sleep apnea (OSA) versus upper airway resistance syndrome (UARS) versus central sleep apnea (CSA), or mixed sleep apnea. A laboratory attended sleep study is considered gold standard for evaluation of sleep disordered breathing and is recommended at this time and clinically justified.   I had a long chat with the patient about my findings and the diagnosis of sleep apnea, particularly OSA, its prognosis and treatment options. We talked about medical/conservative treatments, surgical interventions and non-pharmacological approaches for symptom control. I explained, in particular, the risks and  ramifications of untreated moderate to severe OSA, especially with respect to developing cardiovascular disease down the road, including congestive heart failure (CHF), difficult to treat hypertension, cardiac arrhythmias (particularly A-fib), neurovascular complications including TIA, stroke and dementia. Even type 2 diabetes has, in part, been linked to untreated OSA. Symptoms of untreated OSA may include (but may not be limited to) daytime sleepiness, nocturia (i.e. frequent nighttime urination), memory problems, mood irritability and suboptimally controlled or worsening mood disorder such as depression and/or anxiety, lack of energy, lack of motivation, physical discomfort, as well as recurrent headaches, especially morning or nocturnal headaches. We talked about the importance of maintaining a healthy lifestyle and striving for healthy weight. In addition, we talked about the importance of striving for and maintaining good sleep hygiene. I recommended the following at this time: sleep study.  I outlined the differences between a laboratory attended sleep study which is considered more comprehensive and accurate over the option of a home sleep test (HST); the latter may lead to underestimation of sleep disordered breathing in some instances and does not help with diagnosing upper airway resistance syndrome and is not accurate enough to diagnose primary central sleep apnea typically. I explained the different sleep test procedures to the patient in detail and also outlined possible surgical and non-surgical treatment options of OSA, including the use of a pressure airway pressure (PAP) device (ie CPAP, AutoPAP/APAP or BiPAP in certain circumstances), a custom-made dental device (aka oral appliance, which would require a referral to a specialist dentist or orthodontist typically, and is generally speaking not considered a good choice for patients with full dentures or edentulous state), upper airway surgical  options, such as traditional UPPP (which is not considered a first-line treatment) or the Inspire device (hypoglossal nerve stimulator, which would involve a referral for consultation with an ENT surgeon, after careful selection, following inclusion criteria). I explained the PAP treatment option to the patient in detail, as this is generally considered first-line treatment.  The patient indicated that she would be willing to try PAP therapy, if the need arises. I explained the importance of being compliant with PAP treatment, not only for insurance purposes but primarily to improve patient's symptoms symptoms, and for the patient's long term health benefit, including to reduce Her cardiovascular risks longer-term.    We will pick up our discussion about the next steps and treatment options after testing.  We will keep her posted as to the test results by phone  call and/or MyChart messaging where possible.  We will plan to follow-up in sleep clinic accordingly as well.  I answered all her questions today and the patient was in agreement.   I encouraged her to call with any interim questions, concerns, problems or updates or email [place] through [place].  Generally speaking, sleep test authorizations may take up to 2 weeks, sometimes less, sometimes longer, the patient is encouraged to get in touch with [place] if they do not hear back from the sleep lab staff directly within the next 2 weeks.  Thank you very much for allowing me to participate in the care of this nice patient. If I can be of any further assistance to you please do not hesitate to call me at [phone].  Sincerely,   Star Age, MD, PhD

## 2021-10-09 NOTE — Patient Instructions (Signed)

## 2021-11-01 ENCOUNTER — Telehealth: Payer: Self-pay

## 2021-11-01 NOTE — Telephone Encounter (Signed)
LVM for pt to call back to schedule sleep study.  

## 2021-11-02 NOTE — Telephone Encounter (Signed)
Returned call back; left another message for pt to get scheduled for a sleep study.

## 2021-11-07 NOTE — Telephone Encounter (Signed)
spoke to pt she states she wcb to schedule   [employer] no Josem Kaufmann req spoke to [place] ref # [id number]

## 2021-11-07 NOTE — Telephone Encounter (Signed)
Returned callback again. LVM on both numbers, traying to reach patient to schedule sleep study

## 2022-03-09 ENCOUNTER — Ambulatory Visit: Payer: Self-pay | Admitting: *Deleted

## 2022-03-09 NOTE — Telephone Encounter (Signed)
Reason for Disposition  [1] Nasal discharge AND [2] present > 10 days  Answer Assessment - Initial Assessment Questions 1. LOCATION: "Where does it hurt?"      Having sinus congestion.    Wed. Woke up with  bad headache.   Yesterday I went to work my nose was draining bad.   Mucus coming out into my mask at work. 2. ONSET: "When did the sinus pain start?"  (e.g., hours, days)      Wed. Morning.   Today at 8:15 am I took OTC sinus medicine and the headache is gone and the drainage is better.      No fever. 3. SEVERITY: "How bad is the pain?"   (Scale 1-10; mild, moderate or severe)   - MILD (1-3): doesn't interfere with normal activities    - MODERATE (4-7): interferes with normal activities (e.g., work or school) or awakens from sleep   - SEVERE (8-10): excruciating pain and patient unable to do any normal activities        Moderate sinus. 4. RECURRENT SYMPTOM: "Have you ever had sinus problems before?" If Yes, ask: "When was the last time?" and "What happened that time?"      Not asked 5. NASAL CONGESTION: "Is the nose blocked?" If Yes, ask: "Can you open it or must you breathe through your mouth?"     Yes 6. NASAL DISCHARGE: "Do you have discharge from your nose?" If so ask, "What color?"     Yes a lot of nasal drainage.   Mostly clear.   Coughing up yellow mucus. 7. FEVER: "Do you have a fever?" If Yes, ask: "What is it, how was it measured, and when did it start?"      No 8. OTHER SYMPTOMS: "Do you have any other symptoms?" (e.g., sore throat, cough, earache, difficulty breathing)     Headaches but it's better with the OTC sinus medicine.   Scratchy throat at first but not now.    Some coughing with yellow mucus.   Some post nasal drip 9. PREGNANCY: "Is there any chance you are pregnant?" "When was your last menstrual period?"     N/A due to age  Protocols used: Sinus Pain or Congestion-A-AH  Chief Complaint: sinus congestion and headache, mild cough with mostly clear mucus with a  little yellow color. Symptoms: Runny nose, headache Frequency: Woke up Wed. With it. Pertinent Negatives: Patient denies fever or green mucus.   Disposition: '[]'$ ED /'[]'$ Urgent Care (no appt availability in office) / '[]'$ Appointment(In office/virtual)/ '[]'$  [facility]/ '[x]'$ Home Care/ '[]'$ Refused Recommended Disposition /'[]'$ [place] [facility]/ '[]'$  Follow-up with PCP Additional Notes: You have decided to try the home care advice and if no better by Monday will call us back.   I also let her know about the MyChart virtual visit option if she needed it over the weekend.

## 2022-03-09 NOTE — Telephone Encounter (Signed)
Summary: poss sinus issue/declined another provider   Pt has sinus issues, it hurts behind her eyes. Her head hurts, runny nose.  Took alka seltzer cold this morning. Declined appt w/ erin.  Would like to know if she needs to go to UC walk in clinic. Pt only wanted to see Jolene         Attempted to call patient- left message to call office

## 2022-04-08 NOTE — Patient Instructions (Signed)

## 2022-04-10 ENCOUNTER — Encounter: Payer: Self-pay | Admitting: Nurse Practitioner

## 2022-04-10 ENCOUNTER — Ambulatory Visit (INDEPENDENT_AMBULATORY_CARE_PROVIDER_SITE_OTHER): Payer: Self-pay | Admitting: Nurse Practitioner

## 2022-04-10 VITALS — BP 129/81 | HR 52 | Temp 97.8°F | Ht 67.99 in | Wt 242.4 lb

## 2022-04-10 DIAGNOSIS — R7303 Prediabetes: Secondary | ICD-10-CM

## 2022-04-10 DIAGNOSIS — F3181 Bipolar II disorder: Secondary | ICD-10-CM

## 2022-04-10 DIAGNOSIS — E782 Mixed hyperlipidemia: Secondary | ICD-10-CM

## 2022-04-10 DIAGNOSIS — E669 Obesity, unspecified: Secondary | ICD-10-CM

## 2022-04-10 DIAGNOSIS — R432 Parageusia: Secondary | ICD-10-CM | POA: Insufficient documentation

## 2022-04-10 MED ORDER — RISPERIDONE 3 MG PO TABS: 3.0000 mg | ORAL_TABLET | Freq: Every day | ORAL | 4 refills | Status: DC

## 2022-04-10 MED ORDER — BUPROPION HCL 100 MG PO TABS
100.0000 mg | ORAL_TABLET | Freq: Every day | ORAL | 4 refills | Status: DC
Start: 2022-04-10 — End: 2023-04-26

## 2022-04-10 NOTE — Progress Notes (Signed)
BP 129/81   Pulse (!) 52   Temp 97.8 F (36.6 C) (Oral)   Ht 5' 7.99" (1.727 m)   Wt 242 lb 6.4 oz (110 kg)   LMP  (LMP Unknown)   SpO2 97%   BMI 36.87 kg/m    Subjective:    Patient ID: Gabriella Hensley, female    DOB: 1965/07/03, 57 y.o.   MRN: [id number]  HPI: Gabriella Hensley is a 57 y.o. female  Chief Complaint  Patient presents with   Med review   Difficulty with taste    Happened about 2 weeks ago    LOSS OF TASTE Currently with loss of taste ongoing -- was sick 1 1/2 weeks ago, prior to that her sinuses had been bothering.  Did not test for Covid at time.   Fever: no Cough: yes in past, not current Shortness of breath: no Wheezing: no Chest pain: no Chest tightness: no Chest congestion: yes in past Nasal congestion: yes in past Runny nose: yes in past Post nasal drip: yes Sneezing: no Sore throat: yes when sick weeks ago Swollen glands: no Sinus pressure: no Headache: yes when she was sick Face pain: no Toothache: no Ear pain: none Ear pressure: none Eyes red/itching:no Eye drainage/crusting: no  Vomiting: no Rash: no Fatigue: no Sick contacts: no Strep contacts: no  Context: better with exception of loss of taste Recurrent sinusitis: no  HYPERLIPIDEMIA No current medications.  Has ate this morning.  Did not go for sleep apnea testing due to cost, was seen by neuro on 10/09/21. Hyperlipidemia status: good compliance Aspirin:  no The 10-year ASCVD risk score (Arnett DK, et al., 2019) is: 4.7%   Values used to calculate the score:     Age: 67 years     Sex: Female     Is Non-Hispanic African American: Yes     Diabetic: No     Tobacco smoker: No     Systolic Blood Pressure: 967 mmHg     Is BP treated: No     HDL Cholesterol: 52 mg/dL     Total Cholesterol: 250 mg/dL Chest pain:  no Coronary artery disease:  no Family history CAD:  no Family history early CAD:  no    PREDIABETES Last A1C 5.7% -- focusing on diet. Polydipsia/polyuria:  no Visual disturbance: no Chest pain: no Paresthesias: no  BIPOLAR DISORDER: Continues on Wellbutrin and Risperdal with good control. Has been on Risperdal for 15 years.  Denies SI/HI. Mood status: stable Satisfied with current treatment?: yes Symptom severity: mild Duration of current treatment : chronic Side effects: no Medication compliance: good compliance Psychotherapy/counseling: none Depressed mood: no Anxious mood: no Anhedonia: no Significant weight loss or gain: no Insomnia: none Fatigue: no Feelings of worthlessness or guilt: no Impaired concentration/indecisiveness: no Suicidal ideations: no Hopelessness: no Crying spells: no    08/14/2021   11:07 AM 06/19/2021    8:25 AM 05/09/2021    3:36 PM 09/20/2020    8:07 AM 07/14/2019    3:54 PM  Depression screen PHQ 2/9  Decreased Interest 0 0 0 0 0  Down, Depressed, Hopeless 0 0 0 0 0  PHQ - 2 Score 0 0 0 0 0  Altered sleeping 0 0 0 0 0  Tired, decreased energy 0 0 0 1 0  Change in appetite 0 0 0 1 0  Feeling bad or failure about yourself  0 0 0 0 0  Trouble concentrating 0 0 0 0 0  Moving slowly or fidgety/restless 0 0 0 0 0  Suicidal thoughts 0 0 0 0 0  PHQ-9 Score 0 0 0 2 0  Difficult doing work/chores Not difficult at all Not difficult at all Not difficult at all        08/14/2021   11:07 AM 06/19/2021    8:25 AM 05/09/2021    3:36 PM 12/30/2017    4:04 PM  GAD 7 : Generalized Anxiety Score  Nervous, Anxious, on Edge 0 0 0 0  Control/stop worrying 0 0 0 0  Worry too much - different things 0 0 0 0  Trouble relaxing 0 0 0 0  Restless 0 0 0 0  Easily annoyed or irritable 0 0 0 0  Afraid - awful might happen 0 0 0 0  Total GAD 7 Score 0 0 0 0  Anxiety Difficulty Not difficult at all Not difficult at all Not difficult at all Not difficult at all   Relevant past medical, surgical, family and social history reviewed and updated as indicated. Interim medical history since our last visit reviewed. Allergies  and medications reviewed and updated.  Review of Systems  Constitutional:  Negative for activity change, appetite change, diaphoresis, fatigue and fever.  Respiratory:  Negative for cough, chest tightness and shortness of breath.   Cardiovascular:  Negative for chest pain, palpitations and leg swelling.  Gastrointestinal: Negative.   Endocrine: Negative for cold intolerance, heat intolerance, polydipsia, polyphagia and polyuria.  Neurological: Negative.   Psychiatric/Behavioral: Negative.      Per HPI unless specifically indicated above     Objective:    BP 129/81   Pulse (!) 52   Temp 97.8 F (36.6 C) (Oral)   Ht 5' 7.99" (1.727 m)   Wt 242 lb 6.4 oz (110 kg)   LMP  (LMP Unknown)   SpO2 97%   BMI 36.87 kg/m   Wt Readings from Last 3 Encounters:  04/10/22 242 lb 6.4 oz (110 kg)  10/09/21 245 lb 12.8 oz (111.5 kg)  08/14/21 239 lb 9.6 oz (108.7 kg)    Physical Exam Vitals and nursing note reviewed.  Constitutional:      General: She is awake. She is not in acute distress.    Appearance: She is well-developed. She is obese. She is not ill-appearing.  HENT:     Head: Normocephalic.     Right Ear: Hearing normal.     Left Ear: Hearing normal.  Eyes:     General: Lids are normal.        Right eye: No discharge.        Left eye: No discharge.     Conjunctiva/sclera: Conjunctivae normal.     Pupils: Pupils are equal, round, and reactive to light.  Neck:     Vascular: No carotid bruit.  Cardiovascular:     Rate and Rhythm: Normal rate and regular rhythm.     Heart sounds: Normal heart sounds. No murmur heard.    No gallop.  Pulmonary:     Effort: Pulmonary effort is normal. No accessory muscle usage or respiratory distress.     Breath sounds: Normal breath sounds.  Abdominal:     General: Bowel sounds are normal.     Palpations: Abdomen is soft.  Musculoskeletal:     Cervical back: Normal range of motion and neck supple.     Right lower leg: No edema.     Left  lower leg: No edema.  Skin:    General:  Skin is warm and dry.  Neurological:     Mental Status: She is alert and oriented to person, place, and time.  Psychiatric:        Attention and Perception: Attention normal.        Mood and Affect: Mood normal.        Speech: Speech normal.        Behavior: Behavior normal. Behavior is cooperative.        Thought Content: Thought content normal.    Results for orders placed or performed in visit on 05/09/21  WET PREP FOR [place], YEAST, CLUE   BLD  Result Value Ref Range   Trichomonas Exam Negative Negative   Yeast Exam Negative Negative   Clue Cell Exam Negative Negative  Bayer DCA Hb A1c Waived  Result Value Ref Range   HB A1C (BAYER DCA - WAIVED) 5.7 (H) 4.8 - 5.6 %  Microalbumin, Urine Waived  Result Value Ref Range   Microalb, Ur Waived 10 0 - 19 mg/L   Creatinine, Urine Waived 50 10 - 300 mg/dL   Microalb/Creat Ratio <30 <30 mg/g  Urinalysis, Routine w reflex microscopic  Result Value Ref Range   Specific Gravity, UA 1.010 1.005 - 1.030   pH, UA 6.5 5.0 - 7.5   Color, UA Yellow Yellow   Appearance Ur Cloudy (A) Clear   Leukocytes,UA Negative Negative   Protein,UA Negative Negative/Trace   Glucose, UA Negative Negative   Ketones, UA Negative Negative   RBC, UA Negative Negative   Bilirubin, UA Negative Negative   Urobilinogen, Ur 0.2 0.2 - 1.0 mg/dL   Nitrite, UA Negative Negative  CBC with Differential/Platelet  Result Value Ref Range   WBC 5.1 3.4 - 10.8 x10E3/uL   RBC 4.58 3.77 - 5.28 x10E6/uL   Hemoglobin 13.3 11.1 - 15.9 g/dL   Hematocrit 38.0 34.0 - 46.6 %   MCV 83 79 - 97 fL   MCH 29.0 26.6 - 33.0 pg   MCHC 35.0 31.5 - 35.7 g/dL   RDW 12.5 11.7 - 15.4 %   Platelets 245 150 - 450 x10E3/uL   Neutrophils 40 Not Estab. %   Lymphs 47 Not Estab. %   Monocytes 8 Not Estab. %   Eos 4 Not Estab. %   Basos 1 Not Estab. %   Neutrophils Absolute 2.1 1.4 - 7.0 x10E3/uL   Lymphocytes Absolute 2.4 0.7 - 3.1 x10E3/uL    Monocytes Absolute 0.4 0.1 - 0.9 x10E3/uL   EOS (ABSOLUTE) 0.2 0.0 - 0.4 x10E3/uL   Basophils Absolute 0.0 0.0 - 0.2 x10E3/uL   Immature Granulocytes 0 Not Estab. %   Immature Grans (Abs) 0.0 0.0 - 0.1 x10E3/uL  Comprehensive metabolic panel  Result Value Ref Range   Glucose 81 70 - 99 mg/dL   BUN 7 6 - 24 mg/dL   Creatinine, Ser 0.75 0.57 - 1.00 mg/dL   eGFR 94 >59 mL/min/1.73   BUN/Creatinine Ratio 9 9 - 23   Sodium 138 134 - 144 mmol/L   Potassium 4.3 3.5 - 5.2 mmol/L   Chloride 100 96 - 106 mmol/L   CO2 24 20 - 29 mmol/L   Calcium 9.7 8.7 - 10.2 mg/dL   Total Protein 7.3 6.0 - 8.5 g/dL   Albumin 4.5 3.8 - 4.9 g/dL   Globulin, Total 2.8 1.5 - 4.5 g/dL   Albumin/Globulin Ratio 1.6 1.2 - 2.2   Bilirubin Total 0.5 0.0 - 1.2 mg/dL   Alkaline Phosphatase 130 (H) 44 -  121 IU/L   AST 23 0 - 40 IU/L   ALT 24 0 - 32 IU/L  Lipid Panel w/o Chol/HDL Ratio  Result Value Ref Range   Cholesterol, Total 250 (H) 100 - 199 mg/dL   Triglycerides 95 0 - 149 mg/dL   HDL 52 >39 mg/dL   VLDL Cholesterol Cal 17 5 - 40 mg/dL   LDL Chol Calc (NIH) 181 (H) 0 - 99 mg/dL  TSH  Result Value Ref Range   TSH 1.670 0.450 - 4.500 uIU/mL  VITAMIN D 25 Hydroxy (Vit-D Deficiency, Fractures)  Result Value Ref Range   Vit D, 25-Hydroxy 28.1 (L) 30.0 - 100.0 ng/mL  Prolactin  Result Value Ref Range   Prolactin 17.8 4.8 - 23.3 ng/mL      Assessment & Plan:   Problem List Items Addressed This Visit       Other   [place]    After recent illness, ?Covid.  She did not test at home.  Recommend plenty of fluids at home and monitoring.  Discussed the process with loss of taste after viral infection.      Bipolar 2 disorder (HCC) - Primary    Chronic, stable.  Denies SI/HI.  Has been stable on current regimen for years.  Continue current medication regimen and adjust as needed.  Refills sent.  AIMS = 3 today, some mild tardive dyskinesia symptoms present, at this time she wants no medication adjustments and  we will continue to monitor closely.  Check labs next visit.      Relevant Medications   buPROPion (WELLBUTRIN) 100 MG tablet   Hyperlipidemia    Chronic, ongoing.  No current medications as ASCVD 4.7%.  Continue diet focus.  Lipid panel and CMP next visit.      Obesity (BMI 35.0-39.9 without comorbidity)    BMI 36.87.  Recommended eating smaller high protein, low fat meals more frequently and exercising 30 mins a day 5 times a week with a goal of 10-15lb weight loss in the next 3 months. Patient voiced their understanding and motivation to adhere to these recommendations.       Prediabetes    Ongoing, continues to focus on diet changes.  A1c 5.7% last visit and urine ALB 10 in 2023.  Continued focus on diabetic diet.  Initiate medication as needed.  Plan on lab recheck next visit.        Follow up plan: Return in about 6 months (around 10/09/2022) for PREDIABETES, MOOD, HLD.

## 2022-04-10 NOTE — Assessment & Plan Note (Signed)
Ongoing, continues to focus on diet changes.  A1c 5.7% last visit and urine ALB 10 in 2023.  Continued focus on diabetic diet.  Initiate medication as needed.  Plan on lab recheck next visit.

## 2022-04-10 NOTE — Assessment & Plan Note (Signed)
After recent illness, ?Covid.  She did not test at home.  Recommend plenty of fluids at home and monitoring.  Discussed the process with loss of taste after viral infection.

## 2022-04-10 NOTE — Assessment & Plan Note (Signed)
Chronic, stable.  Denies SI/HI.  Has been stable on current regimen for years.  Continue current medication regimen and adjust as needed.  Refills sent.  AIMS = 3 today, some mild tardive dyskinesia symptoms present, at this time she wants no medication adjustments and we will continue to monitor closely.  Check labs next visit.

## 2022-04-10 NOTE — Assessment & Plan Note (Signed)
Chronic, ongoing.  No current medications as ASCVD 4.7%.  Continue diet focus.  Lipid panel and CMP next visit.

## 2022-04-10 NOTE — Assessment & Plan Note (Signed)
BMI 36.87.  Recommended eating smaller high protein, low fat meals more frequently and exercising 30 mins a day 5 times a week with a goal of 10-15lb weight loss in the next 3 months. Patient voiced their understanding and motivation to adhere to these recommendations.

## 2023-02-06 ENCOUNTER — Ambulatory Visit: Payer: Self-pay | Admitting: *Deleted

## 2023-02-06 ENCOUNTER — Ambulatory Visit (INDEPENDENT_AMBULATORY_CARE_PROVIDER_SITE_OTHER): Payer: Self-pay | Admitting: Nurse Practitioner

## 2023-02-06 ENCOUNTER — Encounter: Payer: Self-pay | Admitting: Nurse Practitioner

## 2023-02-06 VITALS — BP 128/78 | HR 56 | Temp 97.8°F | Ht 68.0 in | Wt 242.6 lb

## 2023-02-06 DIAGNOSIS — E782 Mixed hyperlipidemia: Secondary | ICD-10-CM

## 2023-02-06 DIAGNOSIS — F3181 Bipolar II disorder: Secondary | ICD-10-CM

## 2023-02-06 DIAGNOSIS — G2401 Drug induced subacute dyskinesia: Secondary | ICD-10-CM

## 2023-02-06 DIAGNOSIS — E669 Obesity, unspecified: Secondary | ICD-10-CM

## 2023-02-06 DIAGNOSIS — R7303 Prediabetes: Secondary | ICD-10-CM

## 2023-02-06 DIAGNOSIS — G44209 Tension-type headache, unspecified, not intractable: Secondary | ICD-10-CM

## 2023-02-06 NOTE — Progress Notes (Signed)
BP 128/78 (BP Location: Left Arm, Patient Position: Sitting)   Pulse (!) 56   Temp 97.8 F (36.6 C) (Oral)   Ht 5\' 8"  (1.727 m)   Wt 242 lb 9.6 oz (110 kg)   LMP  (LMP Unknown)   SpO2 98%   BMI 36.89 kg/m    Subjective:    Patient ID: Gabriella Hensley, female    DOB: Jun 16, 1965, 57 y.o.   MRN: [id number]  HPI: Gabriella Hensley is a 57 y.o. female  Chief Complaint  Patient presents with   Depression   Hyperlipidemia   Hypertension   HYPERTENSION / HYPERLIPIDEMIA No current medications.  Has history of elevation in A1c, but last check was 5.7% in February 2023. Duration of hypertension: chronic BP monitoring frequency:  just started checking it BP range: before coming to office this morning it was 175/96 Duration of hyperlipidemia: chronic Cholesterol supplements: none Aspirin: no Recent stressors: no Recurrent headaches:  current headache since yesterday -- in frontal aspect  -- thinks it may be sinuses Visual changes: no Palpitations: no Dyspnea: no Chest pain: no Lower extremity edema: no Dizzy/lightheaded: no  The 10-year ASCVD risk score (Arnett DK, et al., 2019) is: 4.6%   Values used to calculate the score:     Age: 68 years     Sex: Female     Is Non-Hispanic African American: Yes     Diabetic: No     Tobacco smoker: No     Systolic Blood Pressure: 128 mmHg     Is BP treated: No     HDL Cholesterol: 52 mg/dL     Total Cholesterol: 250 mg/dL   BIPOLAR 2 DISORDER Continues on medications with benefit. Mood status: stable Satisfied with current treatment?: yes Symptom severity: mild  Duration of current treatment : chronic Side effects: no Medication compliance: good compliance Depressed mood: no Anxious mood: no Anhedonia: no Significant weight loss or gain: no Insomnia: no Fatigue: no Feelings of worthlessness or guilt: no Impaired concentration/indecisiveness: no Suicidal ideations: no Hopelessness: no Crying spells: no    02/06/2023    3:09  PM 08/14/2021   11:07 AM 06/19/2021    8:25 AM 05/09/2021    3:36 PM 09/20/2020    8:07 AM  Depression screen PHQ 2/9  Decreased Interest 0 0 0 0 0  Down, Depressed, Hopeless 0 0 0 0 0  PHQ - 2 Score 0 0 0 0 0  Altered sleeping 0 0 0 0 0  Tired, decreased energy 0 0 0 0 1  Change in appetite 0 0 0 0 1  Feeling bad or failure about yourself  0 0 0 0 0  Trouble concentrating 0 0 0 0 0  Moving slowly or fidgety/restless 0 0 0 0 0  Suicidal thoughts 0 0 0 0 0  PHQ-9 Score 0 0 0 0 2  Difficult doing work/chores Not difficult at all Not difficult at all Not difficult at all Not difficult at all        02/06/2023    3:10 PM 08/14/2021   11:07 AM 06/19/2021    8:25 AM 05/09/2021    3:36 PM  GAD 7 : Generalized Anxiety Score  Nervous, Anxious, on Edge 0 0 0 0  Control/stop worrying 0 0 0 0  Worry too much - different things 1 0 0 0  Trouble relaxing 0 0 0 0  Restless 0 0 0 0  Easily annoyed or irritable 1 0 0 0  Afraid - awful might happen 0 0 0 0  Total GAD 7 Score 2 0 0 0  Anxiety Difficulty Not difficult at all Not difficult at all Not difficult at all Not difficult at all   AIMS:  Facial and Oral Movements  Muscles of Facial Expression: 0 Lips and Perioral Area: 1 Jaw: 0 Tongue: 0 Extremity Movements: Upper (arms, wrists, hands, fingers): 0  Lower (legs, knees, ankles, toes): 0 Trunk Movements:  Neck, shoulders, hips: 0 Overall Severity : Severity of abnormal movements (highest score from questions above): 1 Incapacitation due to abnormal movements: None Patient's awareness of abnormal movements (rate only patient's report): No Awareness Dental Status  Current problems with teeth and/or dentures?: No  Does patient usually wear dentures?: No    Relevant past medical, surgical, family and social history reviewed and updated as indicated. Interim medical history since our last visit reviewed. Allergies and medications reviewed and updated.  Review of Systems   Constitutional:  Negative for activity change, appetite change, diaphoresis, fatigue and fever.  Respiratory:  Negative for cough, chest tightness and shortness of breath.   Cardiovascular:  Negative for chest pain, palpitations and leg swelling.  Gastrointestinal: Negative.   Endocrine: Negative for cold intolerance, heat intolerance, polydipsia, polyphagia and polyuria.  Neurological:  Positive for headaches. Negative for dizziness, facial asymmetry, speech difficulty, weakness and light-headedness.  Psychiatric/Behavioral: Negative.      Per HPI unless specifically indicated above     Objective:    BP 128/78 (BP Location: Left Arm, Patient Position: Sitting)   Pulse (!) 56   Temp 97.8 F (36.6 C) (Oral)   Ht 5\' 8"  (1.727 m)   Wt 242 lb 9.6 oz (110 kg)   LMP  (LMP Unknown)   SpO2 98%   BMI 36.89 kg/m   Wt Readings from Last 3 Encounters:  02/06/23 242 lb 9.6 oz (110 kg)  04/10/22 242 lb 6.4 oz (110 kg)  10/09/21 245 lb 12.8 oz (111.5 kg)    Physical Exam Vitals and nursing note reviewed.  Constitutional:      General: She is awake. She is not in acute distress.    Appearance: She is well-developed. She is obese. She is not ill-appearing.  HENT:     Head: Normocephalic.     Right Ear: Hearing, ear canal and external ear normal. A middle ear effusion is present.     Left Ear: Hearing, ear canal and external ear normal. A middle ear effusion is present.     Nose:     Right Sinus: No maxillary sinus tenderness or frontal sinus tenderness.     Left Sinus: No maxillary sinus tenderness or frontal sinus tenderness.     Mouth/Throat:     Mouth: Mucous membranes are moist.  Eyes:     General: Lids are normal.        Right eye: No discharge.        Left eye: No discharge.     Conjunctiva/sclera: Conjunctivae normal.     Pupils: Pupils are equal, round, and reactive to light.  Neck:     Vascular: No carotid bruit.  Cardiovascular:     Rate and Rhythm: Normal rate and  regular rhythm.     Heart sounds: Normal heart sounds. No murmur heard.    No gallop.  Pulmonary:     Effort: Pulmonary effort is normal. No accessory muscle usage or respiratory distress.     Breath sounds: Normal breath sounds.  Abdominal:  General: Bowel sounds are normal.     Palpations: Abdomen is soft.  Musculoskeletal:     Cervical back: Full passive range of motion without pain, normal range of motion and neck supple.     Right lower leg: No edema.     Left lower leg: No edema.  Skin:    General: Skin is warm and dry.  Neurological:     Mental Status: She is alert and oriented to person, place, and time.  Psychiatric:        Attention and Perception: Attention normal.        Mood and Affect: Mood normal.        Speech: Speech normal.        Behavior: Behavior normal. Behavior is cooperative.        Thought Content: Thought content normal.     Results for orders placed or performed in visit on 05/09/21  WET PREP FOR TRICH, YEAST, CLUE   BLD  Result Value Ref Range   Trichomonas Exam Negative Negative   Yeast Exam Negative Negative   Clue Cell Exam Negative Negative  Bayer DCA Hb A1c Waived  Result Value Ref Range   HB A1C (BAYER DCA - WAIVED) 5.7 (H) 4.8 - 5.6 %  Microalbumin, Urine Waived  Result Value Ref Range   Microalb, Ur Waived 10 0 - 19 mg/L   Creatinine, Urine Waived 50 10 - 300 mg/dL   Microalb/Creat Ratio <30 <30 mg/g  Urinalysis, Routine w reflex microscopic  Result Value Ref Range   Specific Gravity, UA 1.010 1.005 - 1.030   pH, UA 6.5 5.0 - 7.5   Color, UA Yellow Yellow   Appearance Ur Cloudy (A) Clear   Leukocytes,UA Negative Negative   Protein,UA Negative Negative/Trace   Glucose, UA Negative Negative   Ketones, UA Negative Negative   RBC, UA Negative Negative   Bilirubin, UA Negative Negative   Urobilinogen, Ur 0.2 0.2 - 1.0 mg/dL   Nitrite, UA Negative Negative  CBC with Differential/Platelet  Result Value Ref Range   WBC 5.1 3.4 -  10.8 x10E3/uL   RBC 4.58 3.77 - 5.28 x10E6/uL   Hemoglobin 13.3 11.1 - 15.9 g/dL   Hematocrit 16.1 09.6 - 46.6 %   MCV 83 79 - 97 fL   MCH 29.0 26.6 - 33.0 pg   MCHC 35.0 31.5 - 35.7 g/dL   RDW 04.5 40.9 - 81.1 %   Platelets 245 150 - 450 x10E3/uL   Neutrophils 40 Not Estab. %   Lymphs 47 Not Estab. %   Monocytes 8 Not Estab. %   Eos 4 Not Estab. %   Basos 1 Not Estab. %   Neutrophils Absolute 2.1 1.4 - 7.0 x10E3/uL   Lymphocytes Absolute 2.4 0.7 - 3.1 x10E3/uL   Monocytes Absolute 0.4 0.1 - 0.9 x10E3/uL   EOS (ABSOLUTE) 0.2 0.0 - 0.4 x10E3/uL   Basophils Absolute 0.0 0.0 - 0.2 x10E3/uL   Immature Granulocytes 0 Not Estab. %   Immature Grans (Abs) 0.0 0.0 - 0.1 x10E3/uL  Comprehensive metabolic panel  Result Value Ref Range   Glucose 81 70 - 99 mg/dL   BUN 7 6 - 24 mg/dL   Creatinine, Ser 9.14 0.57 - 1.00 mg/dL   eGFR 94 >78 GN/FAO/1.30   BUN/Creatinine Ratio 9 9 - 23   Sodium 138 134 - 144 mmol/L   Potassium 4.3 3.5 - 5.2 mmol/L   Chloride 100 96 - 106 mmol/L   CO2 24 20 -  29 mmol/L   Calcium 9.7 8.7 - 10.2 mg/dL   Total Protein 7.3 6.0 - 8.5 g/dL   Albumin 4.5 3.8 - 4.9 g/dL   Globulin, Total 2.8 1.5 - 4.5 g/dL   Albumin/Globulin Ratio 1.6 1.2 - 2.2   Bilirubin Total 0.5 0.0 - 1.2 mg/dL   Alkaline Phosphatase 130 (H) 44 - 121 IU/L   AST 23 0 - 40 IU/L   ALT 24 0 - 32 IU/L  Lipid Panel w/o Chol/HDL Ratio  Result Value Ref Range   Cholesterol, Total 250 (H) 100 - 199 mg/dL   Triglycerides 95 0 - 149 mg/dL   HDL 52 >82 mg/dL   VLDL Cholesterol Cal 17 5 - 40 mg/dL   LDL Chol Calc (NIH) 956 (H) 0 - 99 mg/dL  TSH  Result Value Ref Range   TSH 1.670 0.450 - 4.500 uIU/mL  VITAMIN D 25 Hydroxy (Vit-D Deficiency, Fractures)  Result Value Ref Range   Vit D, 25-Hydroxy 28.1 (L) 30.0 - 100.0 ng/mL  Prolactin  Result Value Ref Range   Prolactin 17.8 4.8 - 23.3 ng/mL      Assessment & Plan:   Problem List Items Addressed This Visit       Nervous and Auditory    Tardive dyskinesia    AIMS = 1 today with mild symptoms, discussed at length options -- she refuses to change medication regimen as has worked well for her for >15 years and kept Bipolar stable.  Discussed options, with first being discontinuation of Risperdal if ongoing or worsening.  Could also consider change to Clozapine or addition of Xenazine. Labs today.  Obtain Prolactin level at next visit.        Other   Bipolar 2 disorder (HCC) - Primary    Chronic, stable.  Denies SI/HI.  Has been stable on current regimen for years.  Continue current medication regimen and adjust as needed.  Refills sent.  AIMS = 1 today, some mild tardive dyskinesia symptoms present, at this time she wants no medication adjustments and we will continue to monitor closely.  Check labs today per patient request.      Relevant Orders   CBC with Differential/Platelet   TSH   Hyperlipidemia    Chronic, ongoing.  No current medications as ASCVD 4.6%.  Continue diet focus.  Lipid panel and CMP today per patient request.      Relevant Orders   Comprehensive metabolic panel   Lipid Panel w/o Chol/HDL Ratio   Obesity (BMI 35.0-39.9 without comorbidity)    BMI 36.89.  Recommended eating smaller high protein, low fat meals more frequently and exercising 30 mins a day 5 times a week with a goal of 10-15lb weight loss in the next 3 months. Patient voiced their understanding and motivation to adhere to these recommendations.       Prediabetes    Ongoing, continues to focus on diet changes.  A1c 5.7% last visit and urine ALB 10 in 2023.  Continued focus on diabetic diet.  Initiate medication as needed.  Recheck A1c today.      Relevant Orders   HgB A1c   Other Visit Diagnoses     Acute non intractable tension-type headache       Suspect sinus related.  Recommend she take Claritin 10 MG daily with no D for 7 days. If worsening or ongoing then return to office.  May take Tylenol.        Follow up plan: Return if  symptoms  worsen or fail to improve.

## 2023-02-06 NOTE — Telephone Encounter (Signed)
  Chief Complaint: elevate BP , headache  Symptoms: BP last night 175/96 headache , has not checked BP today .  Hx HTN but does not require medications Frequency: last night  Pertinent Negatives: Patient denies chest pain no difficulty breathing no blurred vision no weakness on either side of body no N/T Disposition: [] ED /[] Urgent Care (no appt availability in office) / [x] Appointment(In office/virtual)/ []  [place] [facility]/ [] Home Care/ [] Refused Recommended Disposition /[] [place] [facility]/ []  Follow-up with PCP Additional Notes:   Appt schedule today     Reason for Disposition  [1] Systolic BP  >= 130 OR Diastolic >= 80 AND [2] not taking BP medications  Answer Assessment - Initial Assessment Questions 1. BLOOD PRESSURE: "What is the blood pressure?" "Did you take at least two measurements 5 minutes apart?"     BP 175/96 last night , unable to check now  2. ONSET: "When did you take your blood pressure?"     Last night 3. HOW: "How did you take your blood pressure?" (e.g., automatic home BP monitor, visiting nurse)     Home BP  4. HISTORY: "Do you have a history of high blood pressure?"     Yes  5. MEDICINES: "Are you taking any medicines for blood pressure?" "Have you missed any doses recently?"     Not taking medication for HTN at this time 6. OTHER SYMPTOMS: "Do you have any symptoms?" (e.g., blurred vision, chest pain, difficulty breathing, headache, weakness)     Headache some dizziness. 7. PREGNANCY: "Is there any chance you are pregnant?" "When was your last menstrual period?"     na  Protocols used: Blood Pressure - High-A-AH

## 2023-02-06 NOTE — Assessment & Plan Note (Addendum)
AIMS = 1 today with mild symptoms, discussed at length options -- she refuses to change medication regimen as has worked well for her for >15 years and kept Bipolar stable.  Discussed options, with first being discontinuation of Risperdal if ongoing or worsening.  Could also consider change to Clozapine or addition of Xenazine. Labs today.  Obtain Prolactin level at next visit.

## 2023-02-06 NOTE — Patient Instructions (Signed)
Prediabetes Eating Plan Prediabetes is a condition that causes blood sugar (glucose) levels to be higher than normal. This increases the risk for developing type 2 diabetes (type 2 diabetes mellitus). Working with a health care provider or nutrition specialist (dietitian) to make diet and lifestyle changes can help prevent the onset of diabetes. These changes may help you: Control your blood glucose levels. Improve your cholesterol levels. Manage your blood pressure. What are tips for following this plan? Reading food labels Read food labels to check the amount of fat, salt (sodium), and sugar in prepackaged foods. Avoid foods that have: Saturated fats. Trans fats. Added sugars. Avoid foods that have more than 300 milligrams (mg) of sodium per serving. Limit your sodium intake to less than 2,300 mg each day. Shopping Avoid buying pre-made and processed foods. Avoid buying drinks with added sugar. Cooking Cook with olive oil. Do not use butter, lard, or ghee. Bake, broil, grill, steam, or boil foods. Avoid frying. Meal planning  Work with your dietitian to create an eating plan that is right for you. This may include tracking how many calories you take in each day. Use a food diary, notebook, or mobile application to track what you eat at each meal. Consider following a Mediterranean diet. This includes: Eating several servings of fresh fruits and vegetables each day. Eating fish at least twice a week. Eating one serving each day of whole grains, beans, nuts, and seeds. Using olive oil instead of other fats. Limiting alcohol. Limiting red meat. Using nonfat or low-fat dairy products. Consider following a plant-based diet. This includes dietary choices that focus on eating mostly vegetables and fruit, grains, beans, nuts, and seeds. If you have high blood pressure, you may need to limit your sodium intake or follow a diet such as the DASH (Dietary Approaches to Stop Hypertension) eating  plan. The DASH diet aims to lower high blood pressure. Lifestyle Set weight loss goals with help from your health care team. It is recommended that most people with prediabetes lose 7% of their body weight. Exercise for at least 30 minutes 5 or more days a week. Attend a support group or seek support from a mental health counselor. Take over-the-counter and prescription medicines only as told by your health care provider. What foods are recommended? Fruits Berries. Bananas. Apples. Oranges. Grapes. Papaya. Mango. Pomegranate. Kiwi. Grapefruit. Cherries. Vegetables Lettuce. Spinach. Peas. Beets. Cauliflower. Cabbage. Broccoli. Carrots. Tomatoes. Squash. Eggplant. Herbs. Peppers. Onions. Cucumbers. Brussels sprouts. Grains Whole grains, such as whole-wheat or whole-grain breads, crackers, cereals, and pasta. Unsweetened oatmeal. Bulgur. Barley. Quinoa. Brown rice. Corn or whole-wheat flour tortillas or taco shells. Meats and other proteins Seafood. Poultry without skin. Lean cuts of pork and beef. Tofu. Eggs. Nuts. Beans. Dairy Low-fat or fat-free dairy products, such as yogurt, cottage cheese, and cheese. Beverages Water. Tea. Coffee. Sugar-free or diet soda. Seltzer water. Low-fat or nonfat milk. Milk alternatives, such as soy or almond milk. Fats and oils Olive oil. Canola oil. Sunflower oil. Grapeseed oil. Avocado. Walnuts. Sweets and desserts Sugar-free or low-fat pudding. Sugar-free or low-fat ice cream and other frozen treats. Seasonings and condiments Herbs. Sodium-free spices. Mustard. Relish. Low-salt, low-sugar ketchup. Low-salt, low-sugar barbecue sauce. Low-fat or fat-free mayonnaise. The items listed above may not be a complete list of recommended foods and beverages. Contact a dietitian for more information. What foods are not recommended? Fruits Fruits canned with syrup. Vegetables Canned vegetables. Frozen vegetables with butter or cream sauce. Grains Refined white  flour and flour   products, such as bread, pasta, snack foods, and cereals. Meats and other proteins Fatty cuts of meat. Poultry with skin. Breaded or fried meat. Processed meats. Dairy Full-fat yogurt, cheese, or milk. Beverages Sweetened drinks, such as iced tea and soda. Fats and oils Butter. Lard. Ghee. Sweets and desserts Baked goods, such as cake, cupcakes, pastries, cookies, and cheesecake. Seasonings and condiments Spice mixes with added salt. Ketchup. Barbecue sauce. Mayonnaise. The items listed above may not be a complete list of foods and beverages that are not recommended. Contact a dietitian for more information. Where to find more information American Diabetes Association: www.diabetes.org Summary You may need to make diet and lifestyle changes to help prevent the onset of diabetes. These changes can help you control blood sugar, improve cholesterol levels, and manage blood pressure. Set weight loss goals with help from your health care team. It is recommended that most people with prediabetes lose 7% of their body weight. Consider following a Mediterranean diet. This includes eating plenty of fresh fruits and vegetables, whole grains, beans, nuts, seeds, fish, and low-fat dairy, and using olive oil instead of other fats. This information is not intended to replace advice given to you by your health care provider. Make sure you discuss any questions you have with your health care provider. Document Revised: 06/11/2019 Document Reviewed: 06/11/2019 Elsevier Patient Education  2024 [employer] Inc.  

## 2023-02-06 NOTE — Assessment & Plan Note (Signed)
BMI 36.89.  Recommended eating smaller high protein, low fat meals more frequently and exercising 30 mins a day 5 times a week with a goal of 10-15lb weight loss in the next 3 months. Patient voiced their understanding and motivation to adhere to these recommendations.

## 2023-02-06 NOTE — Assessment & Plan Note (Signed)
Chronic, ongoing.  No current medications as ASCVD 4.6%.  Continue diet focus.  Lipid panel and CMP today per patient request.

## 2023-02-06 NOTE — Assessment & Plan Note (Signed)
Chronic, stable.  Denies SI/HI.  Has been stable on current regimen for years.  Continue current medication regimen and adjust as needed.  Refills sent.  AIMS = 1 today, some mild tardive dyskinesia symptoms present, at this time she wants no medication adjustments and we will continue to monitor closely.  Check labs today per patient request.

## 2023-02-06 NOTE — Assessment & Plan Note (Signed)
Ongoing, continues to focus on diet changes.  A1c 5.7% last visit and urine ALB 10 in 2023.  Continued focus on diabetic diet.  Initiate medication as needed.  Recheck A1c today.

## 2023-02-07 LAB — COMPREHENSIVE METABOLIC PANEL
ALT: 50 [IU]/L — ABNORMAL HIGH (ref 0–32)
AST: 28 [IU]/L (ref 0–40)
Albumin: 4.3 g/dL (ref 3.8–4.9)
Alkaline Phosphatase: 143 [IU]/L — ABNORMAL HIGH (ref 44–121)
BUN/Creatinine Ratio: 14 (ref 9–23)
BUN: 11 mg/dL (ref 6–24)
Bilirubin Total: 0.8 mg/dL (ref 0.0–1.2)
CO2: 23 mmol/L (ref 20–29)
Calcium: 9.5 mg/dL (ref 8.7–10.2)
Chloride: 101 mmol/L (ref 96–106)
Creatinine, Ser: 0.8 mg/dL (ref 0.57–1.00)
Globulin, Total: 3.2 g/dL (ref 1.5–4.5)
Glucose: 94 mg/dL (ref 70–99)
Potassium: 3.8 mmol/L (ref 3.5–5.2)
Sodium: 140 mmol/L (ref 134–144)
Total Protein: 7.5 g/dL (ref 6.0–8.5)
eGFR: 86 mL/min/{1.73_m2} (ref >59)

## 2023-02-07 LAB — CBC WITH DIFFERENTIAL/PLATELET
Basophils Absolute: 0 10*3/uL (ref 0.0–0.2)
Basos: 1 %
EOS (ABSOLUTE): 0.2 10*3/uL (ref 0.0–0.4)
Eos: 4 %
Hematocrit: 41.6 % (ref 34.0–46.6)
Hemoglobin: 13.2 g/dL (ref 11.1–15.9)
Immature Grans (Abs): 0 10*3/uL (ref 0.0–0.1)
Immature Granulocytes: 0 %
Lymphocytes Absolute: 2 10*3/uL (ref 0.7–3.1)
Lymphs: 45 %
MCH: 28 pg (ref 26.6–33.0)
MCHC: 31.7 g/dL (ref 31.5–35.7)
MCV: 88 fL (ref 79–97)
Monocytes Absolute: 0.4 10*3/uL (ref 0.1–0.9)
Monocytes: 8 %
Neutrophils Absolute: 1.8 10*3/uL (ref 1.4–7.0)
Neutrophils: 42 %
Platelets: 234 10*3/uL (ref 150–450)
RBC: 4.71 x10E6/uL (ref 3.77–5.28)
RDW: 12.5 % (ref 11.7–15.4)
WBC: 4.3 10*3/uL (ref 3.4–10.8)

## 2023-02-07 LAB — LIPID PANEL W/O CHOL/HDL RATIO
Cholesterol, Total: 255 mg/dL — ABNORMAL HIGH (ref 100–199)
HDL: 47 mg/dL
LDL Chol Calc (NIH): 183 mg/dL — ABNORMAL HIGH (ref 0–99)
Triglycerides: 138 mg/dL (ref 0–149)
VLDL Cholesterol Cal: 25 mg/dL (ref 5–40)

## 2023-02-07 LAB — HEMOGLOBIN A1C
Est. average glucose Bld gHb Est-mCnc: 128 mg/dL
Hgb A1c MFr Bld: 6.1 % — ABNORMAL HIGH (ref 4.8–5.6)

## 2023-02-07 LAB — TSH: TSH: 1.05 u[IU]/mL (ref 0.450–4.500)

## 2023-02-07 NOTE — Progress Notes (Signed)
Contacted via MyChart The 10-year ASCVD risk score (Arnett DK, et al., 2019) is: 5.1%   Values used to calculate the score:     Age: 57 years     Sex: Female     Is Non-Hispanic African American: Yes     Diabetic: No     Tobacco smoker: No     Systolic Blood Pressure: 128 mmHg     Is BP treated: No     HDL Cholesterol: 47 mg/dL     Total Cholesterol: 255 mg/dL   Good evening Gabriella Hensley, your labs have returned: - Kidney function, creatinine and eGFR, remains normal.  Liver function overall stable, but some mild elevation in ALT.  AST is normal. Alkaline phosphatase is elevated too.  Do you still have a gall bladder? - Lipid panel continues to show elevation in LDL, bad cholesterol, and total cholesterol.  I would like to see LDL come down.  We do not need medication yet, but in future if ongoing elevations we may.  Focus heavily on diet. - A1c is in prediabetic range still.  It did creep up a little from 5.7% to 6.1%.  Again, focus heavily on healthy diet choices and regular exercise.  We will recheck next visit. - Remainder of labs stable.  Any questions? Keep being amazing!!  Thank you for allowing me to participate in your care.  I appreciate you. Kindest regards, Gabriella Hensley

## 2023-02-11 ENCOUNTER — Ambulatory Visit: Payer: Self-pay | Admitting: *Deleted

## 2023-02-11 NOTE — Telephone Encounter (Signed)
Reason for Disposition  [1] Follow-up call to recent contact AND [2] information only call, no triage required  Answer Assessment - Initial Assessment Questions 1. REASON FOR CALL or QUESTION: "What is your reason for calling today?" or "How can I best help you?" or "What question do you have that I can help answer?"     Pt called in and was given the message from Aura Dials, NP dated 02/07/2023 at 6:07 PM.    She has questions regarding the liver enzymes.    She does still have her gallbladder.  Jolene wanted to know in her message.  Protocols used: Information Only Call - No Triage-A-AH

## 2023-02-11 NOTE — Telephone Encounter (Signed)
  Chief Complaint: Pt called in and was given her lab results. Symptoms: N/A Frequency: N/A Pertinent Negatives: Patient denies N/A Disposition: [] ED /[] Urgent Care (no appt availability in office) / [] Appointment(In office/virtual)/ []  [facility]/ [] Home Care/ [] Refused Recommended Disposition /[] [place] [facility]/ [x]  Follow-up with PCP Additional Notes: She has questions about the elevated liver enzymes.   She does have her gallbladder.  Gabriella Hensley asked about this is her lab result note.   Please call her back.   If possible can someone call her between 1-2:00 any day of the week?    Thanks.

## 2023-02-13 NOTE — Telephone Encounter (Signed)
Called and notified patient of Jolene's message. Patient verbalized understanding.

## 2023-04-05 ENCOUNTER — Encounter: Payer: Self-pay | Admitting: Nurse Practitioner

## 2023-04-05 ENCOUNTER — Ambulatory Visit: Payer: Self-pay

## 2023-04-05 ENCOUNTER — Ambulatory Visit: Payer: Managed Care, Other (non HMO) | Admitting: Nurse Practitioner

## 2023-04-05 VITALS — BP 136/78 | HR 57 | Temp 97.6°F | Wt 246.6 lb

## 2023-04-05 DIAGNOSIS — J0110 Acute frontal sinusitis, unspecified: Secondary | ICD-10-CM

## 2023-04-05 DIAGNOSIS — J321 Chronic frontal sinusitis: Secondary | ICD-10-CM | POA: Insufficient documentation

## 2023-04-05 MED ORDER — AMOXICILLIN-POT CLAVULANATE 875-125 MG PO TABS: 1.0000 | ORAL_TABLET | Freq: Two times a day (BID) | ORAL | 0 refills | Status: AC

## 2023-04-05 MED ORDER — PREDNISONE 20 MG PO TABS
40.0000 mg | ORAL_TABLET | Freq: Every day | ORAL | 0 refills | Status: AC
Start: 2023-04-05 — End: 2023-04-10

## 2023-04-05 NOTE — Assessment & Plan Note (Signed)
 Acute for 2 weeks with no improvement. Low suspicion for Covid or flu and is past treatment period for both.  Will start Augmentin  BID for 7 days and Prednisone  40 MG daily for 5 days.  Recommend: - Increased rest - Increasing Fluids - Acetaminophen / ibuprofen  as needed for fever/pain.  - Salt water gargling, chloraseptic spray and throat lozenges - OTC Coricidin and Claritin + Flonase - Mucinex.  - Humidifying the air.

## 2023-04-05 NOTE — Patient Instructions (Addendum)
 Coricidin for cold  Sinus Infection, Adult A sinus infection is soreness and swelling (inflammation) of your sinuses. Sinuses are hollow spaces in the bones around your face. They are located: Around your eyes. In the middle of your forehead. Behind your nose. In your cheekbones. Your sinuses and nasal passages are lined with a fluid called mucus. Mucus drains out of your sinuses. Swelling can trap mucus in your sinuses. This lets germs (bacteria, virus, or fungus) grow, which leads to infection. Most of the time, this condition is caused by a virus. What are the causes? Allergies. Asthma. Germs. Things that block your nose or sinuses. Growths in the nose (nasal polyps). Chemicals or irritants in the air. A fungus. This is rare. What increases the risk? Having a weak body defense system (immune system). Doing a lot of swimming or diving. Using nasal sprays too much. Smoking. What are the signs or symptoms? The main symptoms of this condition are pain and a feeling of pressure around the sinuses. Other symptoms include: Stuffy nose (congestion). This may make it hard to breathe through your nose. Runny nose (drainage). Soreness, swelling, and warmth in the sinuses. A cough that may get worse at night. Being unable to smell and taste. Mucus that collects in the throat or the back of the nose (postnasal drip). This may cause a sore throat or bad breath. Being very tired (fatigued). A fever. How is this diagnosed? Your symptoms. Your medical history. A physical exam. Tests to find out if your condition is short-term (acute) or long-term (chronic). Your doctor may: Check your nose for growths (polyps). Check your sinuses using a tool that has a light on one end (endoscope). Check for allergies or germs. Do imaging tests, such as an MRI or CT scan. How is this treated? Treatment for this condition depends on the cause and whether it is short-term or long-term. If caused by a  virus, your symptoms should go away on their own within 10 days. You may be given medicines to relieve symptoms. They include: Medicines that shrink swollen tissue in the nose. A spray that treats swelling of the nostrils. Rinses that help get rid of thick mucus in your nose (nasal saline washes). Medicines that treat allergies (antihistamines). Over-the-counter pain relievers. If caused by bacteria, your doctor may wait to see if you will get better without treatment. You may be given antibiotic medicine if you have: A very bad infection. A weak body defense system. If caused by growths in the nose, surgery may be needed. Follow these instructions at home: Medicines Take, use, or apply over-the-counter and prescription medicines only as told by your doctor. These may include nasal sprays. If you were prescribed an antibiotic medicine, take it as told by your doctor. Do not stop taking it even if you start to feel better. Hydrate and humidify  Drink enough water to keep your pee (urine) pale yellow. Use a cool mist humidifier to keep the humidity level in your home above 50%. Breathe in steam for 10-15 minutes, 3-4 times a day, or as told by your doctor. You can do this in the bathroom while a hot shower is running. Try not to spend time in cool or dry air. Rest Rest as much as you can. Sleep with your head raised (elevated). Make sure you get enough sleep each night. General instructions  Put a warm, moist washcloth on your face 3-4 times a day, or as often as told by your doctor. Use nasal saline  washes as often as told by your doctor. Wash your hands often with soap and water. If you cannot use soap and water, use hand sanitizer. Do not smoke. Avoid being around people who are smoking (secondhand smoke). Keep all follow-up visits. Contact a doctor if: You have a fever. Your symptoms get worse. Your symptoms do not get better within 10 days. Get help right away if: You have a  very bad headache. You cannot stop vomiting. You have very bad pain or swelling around your face or eyes. You have trouble seeing. You feel confused. Your neck is stiff. You have trouble breathing. These symptoms may be an emergency. Get help right away. Call 911. Do not wait to see if the symptoms will go away. Do not drive yourself to the hospital. Summary A sinus infection is swelling of your sinuses. Sinuses are hollow spaces in the bones around your face. This condition is caused by tissues in your nose that become inflamed or swollen. This traps germs. These can lead to infection. If you were prescribed an antibiotic medicine, take it as told by your doctor. Do not stop taking it even if you start to feel better. Keep all follow-up visits. This information is not intended to replace advice given to you by your health care provider. Make sure you discuss any questions you have with your health care provider. Document Revised: 02/14/2021 Document Reviewed: 02/14/2021 Elsevier Patient Education  2024 [employer].

## 2023-04-05 NOTE — Telephone Encounter (Signed)
 Summary: Possible sinus infection - rx requested   Pt believes she has a sinus infection for about 2 weeks now. Pt states everything she blows out of nose is thick. Pt states she has cotton mouth in the morning when waking up.  Pt requesting prescription     Called pt - left message to return our call.

## 2023-04-05 NOTE — Telephone Encounter (Signed)
   Chief Complaint: sinus pressure-2 weeks Symptoms: thick discolored mucus, trouble breathing more at night Frequency: 2 weeks Pertinent Negatives: Patient denies fever, pain Disposition: [] ED /[] Urgent Care (no appt availability in office) / [x] Appointment(In office/virtual)/ []  [facility]/ [] Home Care/ [] Refused Recommended Disposition /[] [place] [facility]/ []  Follow-up with PCP Additional Notes: Patient states she normally can wait congestion out- but not getting better. Appointment scheduled    Reason for Disposition  [1] Sinus congestion (pressure, fullness) AND [2] present > 10 days  Answer Assessment - Initial Assessment Questions 1. LOCATION: Where does it hurt?      At night worse- sinus congestion- no pain 2. ONSET: When did the sinus pain start?  (e.g., hours, days)      2 weeks 3. SEVERITY: How bad is the pain?   (Scale 1-10; mild, moderate or severe)   - MILD (1-3): doesn't interfere with normal activities    - MODERATE (4-7): interferes with normal activities (e.g., work or school) or awakens from sleep   - SEVERE (8-10): excruciating pain and patient unable to do any normal activities        No pain- facial pressure with congestion 4. RECURRENT SYMPTOM: Have you ever had sinus problems before? If Yes, ask: When was the last time? and What happened that time?      Yes- it has been a while- patient is usually able to wait it out 5. NASAL CONGESTION: Is the nose blocked? If Yes, ask: Can you open it or must you breathe through your mouth?     At night gets blocked- runny nose during the day 6. NASAL DISCHARGE: Do you have discharge from your nose? If so ask, What color?     Green and dark at first- now green/clear 7. FEVER: Do you have a fever? If Yes, ask: What is it, how was it measured, and when did it start?      no 8. OTHER SYMPTOMS: Do you have any other symptoms? (e.g., sore throat, cough, earache, difficulty breathing)      Sore throat, heavy breathing- with exertion  Protocols used: Sinus Pain or Congestion-A-AH

## 2023-04-05 NOTE — Progress Notes (Signed)
 BP 136/78 (BP Location: Left Arm)   Pulse (!) 57   Temp 97.6 F (36.4 C) (Oral)   Wt 246 lb 9.6 oz (111.9 kg)   LMP  (LMP Unknown)   SpO2 96%   BMI 37.50 kg/m    Subjective:    Patient ID: Gabriella Hensley, female    DOB: 25-Jan-1966, 58 y.o.   MRN: [id number]  HPI: Gabriella Hensley is a 58 y.o. female  Chief Complaint  Patient presents with   URI    Patient states she has been having sinus congestion and sinus pressure for the last 2 week. States it is worse at night and first thing in the mornings.    UPPER RESPIRATORY TRACT INFECTION Started with symptoms two weeks -- sinus pressure and cough.  No Covid testing. Fever: no Cough: yes mild Shortness of breath: no Wheezing: no Chest pain: no Chest tightness: no Chest congestion: no Nasal congestion: yes Runny nose: yes Post nasal drip: yes Sneezing: no Sore throat: yes bad for 3 days and now better Swollen glands: no Sinus pressure: yes worse at night Headache: yes Face pain: no Toothache: no Ear pain: none Ear pressure: yes bilateral Eyes red/itching:no Eye drainage/crusting: no  Vomiting: no Rash: no Fatigue: yes Sick contacts: no Strep contacts: no  Context: fluctuating Recurrent sinusitis: no Relief with OTC cold/cough medications: yes  Treatments attempted: Alka Seltzer severe Cold    Relevant past medical, surgical, family and social history reviewed and updated as indicated. Interim medical history since our last visit reviewed. Allergies and medications reviewed and updated.  Review of Systems  Constitutional:  Positive for fatigue. Negative for activity change, appetite change, chills and fever.  HENT:  Positive for congestion, postnasal drip, rhinorrhea, sinus pressure, sinus pain and sore throat. Negative for ear discharge, ear pain, facial swelling, sneezing and voice change.   Respiratory:  Positive for cough. Negative for chest tightness, shortness of breath and wheezing.   Cardiovascular:   Negative for chest pain, palpitations and leg swelling.  Gastrointestinal: Negative.   Endocrine: Negative.   Neurological:  Positive for headaches. Negative for dizziness and numbness.  Psychiatric/Behavioral: Negative.      Per HPI unless specifically indicated above     Objective:    BP 136/78 (BP Location: Left Arm)   Pulse (!) 57   Temp 97.6 F (36.4 C) (Oral)   Wt 246 lb 9.6 oz (111.9 kg)   LMP  (LMP Unknown)   SpO2 96%   BMI 37.50 kg/m   Wt Readings from Last 3 Encounters:  04/05/23 246 lb 9.6 oz (111.9 kg)  02/06/23 242 lb 9.6 oz (110 kg)  04/10/22 242 lb 6.4 oz (110 kg)    Physical Exam Vitals and nursing note reviewed.  Constitutional:      General: She is awake. She is not in acute distress.    Appearance: She is well-developed and well-groomed. She is obese. She is not ill-appearing or toxic-appearing.  HENT:     Head: Normocephalic.     Right Ear: Hearing, ear canal and external ear normal. A middle ear effusion is present. Tympanic membrane is not injected or perforated.     Left Ear: Hearing, ear canal and external ear normal. A middle ear effusion is present. Tympanic membrane is not injected or perforated.     Nose: Rhinorrhea present. Rhinorrhea is clear.     Right Sinus: Frontal sinus tenderness present. No maxillary sinus tenderness.     Left Sinus: Frontal  sinus tenderness present. No maxillary sinus tenderness.     Mouth/Throat:     Mouth: Mucous membranes are moist.     Pharynx: Posterior oropharyngeal erythema (mild) present. No pharyngeal swelling or oropharyngeal exudate.  Eyes:     General: Lids are normal.        Right eye: No discharge.        Left eye: No discharge.     Conjunctiva/sclera: Conjunctivae normal.     Pupils: Pupils are equal, round, and reactive to light.  Neck:     Thyroid : No thyromegaly.     Vascular: No carotid bruit.  Cardiovascular:     Rate and Rhythm: Normal rate and regular rhythm.     Heart sounds: Normal heart  sounds. No murmur heard.    No gallop.  Pulmonary:     Effort: Pulmonary effort is normal. No accessory muscle usage or respiratory distress.     Breath sounds: Normal breath sounds. No decreased breath sounds, wheezing or rhonchi.  Abdominal:     General: Bowel sounds are normal.     Palpations: Abdomen is soft. There is no hepatomegaly or splenomegaly.  Musculoskeletal:     Cervical back: Normal range of motion and neck supple.     Right lower leg: No edema.     Left lower leg: No edema.  Lymphadenopathy:     Head:     Right side of head: Submandibular adenopathy present. No submental, tonsillar, preauricular or posterior auricular adenopathy.     Left side of head: Submandibular adenopathy present. No submental, tonsillar, preauricular or posterior auricular adenopathy.     Cervical: No cervical adenopathy.  Skin:    General: Skin is warm and dry.  Neurological:     Mental Status: She is alert and oriented to person, place, and time.  Psychiatric:        Attention and Perception: Attention normal.        Mood and Affect: Mood normal.        Speech: Speech normal.        Behavior: Behavior normal. Behavior is cooperative.        Thought Content: Thought content normal.    Results for orders placed or performed in visit on 02/06/23  CBC with Differential/Platelet   Collection Time: 02/06/23  3:51 PM  Result Value Ref Range   WBC 4.3 3.4 - 10.8 x10E3/uL   RBC 4.71 3.77 - 5.28 x10E6/uL   Hemoglobin 13.2 11.1 - 15.9 g/dL   Hematocrit 58.3 65.9 - 46.6 %   MCV 88 79 - 97 fL   MCH 28.0 26.6 - 33.0 pg   MCHC 31.7 31.5 - 35.7 g/dL   RDW 87.4 88.2 - 84.5 %   Platelets 234 150 - 450 x10E3/uL   Neutrophils 42 Not Estab. %   Lymphs 45 Not Estab. %   Monocytes 8 Not Estab. %   Eos 4 Not Estab. %   Basos 1 Not Estab. %   Neutrophils Absolute 1.8 1.4 - 7.0 x10E3/uL   Lymphocytes Absolute 2.0 0.7 - 3.1 x10E3/uL   Monocytes Absolute 0.4 0.1 - 0.9 x10E3/uL   EOS (ABSOLUTE) 0.2 0.0 -  0.4 x10E3/uL   Basophils Absolute 0.0 0.0 - 0.2 x10E3/uL   Immature Granulocytes 0 Not Estab. %   Immature Grans (Abs) 0.0 0.0 - 0.1 x10E3/uL  Comprehensive metabolic panel   Collection Time: 02/06/23  3:51 PM  Result Value Ref Range   Glucose 94 70 - 99 mg/dL  BUN 11 6 - 24 mg/dL   Creatinine, Ser 9.19 0.57 - 1.00 mg/dL   eGFR 86 >40 fO/fpw/8.26   BUN/Creatinine Ratio 14 9 - 23   Sodium 140 134 - 144 mmol/L   Potassium 3.8 3.5 - 5.2 mmol/L   Chloride 101 96 - 106 mmol/L   CO2 23 20 - 29 mmol/L   Calcium 9.5 8.7 - 10.2 mg/dL   Total Protein 7.5 6.0 - 8.5 g/dL   Albumin 4.3 3.8 - 4.9 g/dL   Globulin, Total 3.2 1.5 - 4.5 g/dL   Bilirubin Total 0.8 0.0 - 1.2 mg/dL   Alkaline Phosphatase 143 (H) 44 - 121 IU/L   AST 28 0 - 40 IU/L   ALT 50 (H) 0 - 32 IU/L  Lipid Panel w/o Chol/HDL Ratio   Collection Time: 02/06/23  3:51 PM  Result Value Ref Range   Cholesterol, Total 255 (H) 100 - 199 mg/dL   Triglycerides 861 0 - 149 mg/dL   HDL 47 >60 mg/dL   VLDL Cholesterol Cal 25 5 - 40 mg/dL   LDL Chol Calc (NIH) 816 (H) 0 - 99 mg/dL  TSH   Collection Time: 02/06/23  3:51 PM  Result Value Ref Range   TSH 1.050 0.450 - 4.500 uIU/mL  HgB A1c   Collection Time: 02/06/23  3:51 PM  Result Value Ref Range   Hgb A1c MFr Bld 6.1 (H) 4.8 - 5.6 %   Est. average glucose Bld gHb Est-mCnc 128 mg/dL      Assessment & Plan:   Problem List Items Addressed This Visit       Respiratory   Frontal sinusitis - Primary   Acute for 2 weeks with no improvement. Low suspicion for Covid or flu and is past treatment period for both.  Will start Augmentin  BID for 7 days and Prednisone  40 MG daily for 5 days.  Recommend: - Increased rest - Increasing Fluids - Acetaminophen / ibuprofen  as needed for fever/pain.  - Salt water gargling, chloraseptic spray and throat lozenges - OTC Coricidin and Claritin + Flonase - Mucinex.  - Humidifying the air.       Relevant Medications   amoxicillin -clavulanate  (AUGMENTIN ) 875-125 MG tablet   predniSONE  (DELTASONE ) 20 MG tablet     Follow up plan: Return in about 3 months (around 07/04/2023) for Annual Physical.

## 2023-04-25 ENCOUNTER — Other Ambulatory Visit: Payer: Self-pay | Admitting: Nurse Practitioner

## 2023-04-25 DIAGNOSIS — F3181 Bipolar II disorder: Secondary | ICD-10-CM

## 2023-04-26 NOTE — Telephone Encounter (Signed)
Requested Prescriptions  Pending Prescriptions Disp Refills   buPROPion (WELLBUTRIN) 100 MG tablet [Pharmacy Med Name: buPROPion HCl 100 MG Oral Tablet] 90 tablet 0    Sig: Take 1 tablet by mouth once daily     Psychiatry: Antidepressants - bupropion Failed - 04/26/2023  1:59 PM      Failed - ALT in normal range and within 360 days    ALT  Date Value Ref Range Status  02/06/2023 50 (H) 0 - 32 IU/L Final   SGPT (ALT)  Date Value Ref Range Status  06/06/2011 23 U/L Final    Comment:    12-78 NOTE: NEW REFERENCE RANGE 02/16/2011          Passed - Cr in normal range and within 360 days    Creatinine  Date Value Ref Range Status  06/06/2011 0.63 0.60 - 1.30 mg/dL Final   Creatinine, Ser  Date Value Ref Range Status  02/06/2023 0.80 0.57 - 1.00 mg/dL Final         Passed - AST in normal range and within 360 days    AST  Date Value Ref Range Status  02/06/2023 28 0 - 40 IU/L Final   SGOT(AST)  Date Value Ref Range Status  06/06/2011 18 15 - 37 Unit/L Final         Passed - Last BP in normal range    BP Readings from Last 1 Encounters:  04/05/23 136/78         Passed - Valid encounter within last 6 months    Recent Outpatient Visits           3 weeks ago Acute non-recurrent frontal sinusitis   [place] [facility] [place], Corrie Dandy T, NP   2 months ago Bipolar 2 disorder (HCC)   [place] [facility] [place], Corrie Dandy T, NP   1 year ago Bipolar 2 disorder (HCC)   [place] [facility] [place], Dorie Rank, NP   1 year ago Fecal smearing   [place] [facility] [place], Corrie Dandy T, NP   1 year ago Fecal smearing   [place] [facility] [place], [place] T, NP       Future Appointments             In 2 months Cannady, [place] T, NP  [facility]             risperiDONE (RISPERDAL) 3 MG tablet [Pharmacy Med Name: risperiDONE 3 MG Oral Tablet] 90 tablet 0    Sig:  Take 1 tablet by mouth once daily     Not Delegated - Psychiatry:  Antipsychotics - Second Generation (Atypical) - risperidone Failed - 04/26/2023  1:59 PM      Failed - This refill cannot be delegated      Failed - Lipid Panel in normal range within the last 12 months    Cholesterol, Total  Date Value Ref Range Status  02/06/2023 255 (H) 100 - 199 mg/dL Final   LDL Chol Calc (NIH)  Date Value Ref Range Status  02/06/2023 183 (H) 0 - 99 mg/dL Final   HDL  Date Value Ref Range Status  02/06/2023 47 >39 mg/dL Final   Triglycerides  Date Value Ref Range Status  02/06/2023 138 0 - 149 mg/dL Final         Passed - TSH in normal range and within 360 days    TSH  Date Value Ref Range Status  02/06/2023 1.050 0.450 - 4.500 uIU/mL Final         Passed - Completed PHQ-2 or PHQ-9 in the last 360 days      Passed - Last BP in normal range    BP Readings from Last 1 Encounters:  04/05/23 136/78         Passed - Last Heart Rate in normal range    Pulse Readings from Last 1 Encounters:  04/05/23 (!) 57         Passed - Valid encounter within last 6 months    Recent Outpatient Visits           3 weeks ago Acute non-recurrent frontal sinusitis   [place] [facility] [place], [place] T, NP   2 months ago Bipolar 2 disorder (HCC)   [place] [facility] [place], Corrie Dandy T, NP   1 year ago Bipolar 2 disorder (HCC)   [place] [facility] [place], Dorie Rank, NP   1 year ago Fecal smearing   [place] [facility] [place], Corrie Dandy T, NP   1 year ago Fecal smearing   [place] [facility] [place], Corrie Dandy T, NP       Future Appointments             In 2 months Cannady, [place] T, NP [place] [facility]            Passed - CBC within normal limits and completed in the last 12 months    WBC  Date Value Ref Range Status  02/06/2023 4.3 3.4 - 10.8 x10E3/uL Final  08/08/2011 4.5 3.6  - 11.0 x10 3/mm 3 Final   RBC  Date Value Ref Range Status  02/06/2023 4.71 3.77 - 5.28 x10E6/uL Final  08/08/2011 4.06 3.80 - 5.20 X10 6/mm 3 Final   Hemoglobin  Date Value Ref Range Status  02/06/2023 13.2 11.1 - 15.9 g/dL Final   Hematocrit  Date Value Ref Range Status  02/06/2023 41.6 34.0 - 46.6 % Final   MCHC  Date Value Ref Range Status  02/06/2023 31.7 31.5 - 35.7 g/dL Final  54/11/8117 14.7 32.0 - 36.0 g/dL Final   [facility]  Date Value Ref Range Status  02/06/2023 28.0 26.6 - 33.0 pg Final  08/08/2011 29.1 26.0 - 34.0 pg Final   MCV  Date Value Ref Range Status  02/06/2023 88 79 - 97 fL Final  08/08/2011 87 80 - 100 fL Final   No results found for: "PLTCOUNTKUC", "LABPLAT", "POCPLA" RDW  Date Value Ref Range Status  02/06/2023 12.5 11.7 - 15.4 % Final  08/08/2011 12.9 11.5 - 14.5 % Final         Passed - CMP within normal limits and completed in the last 12 months    Albumin  Date Value Ref Range Status  02/06/2023 4.3 3.8 - 4.9 g/dL Final  82/95/6213 4.1 3.4 - 5.0 g/dL Final   Alkaline Phosphatase  Date Value Ref Range Status  02/06/2023 143 (H) 44 - 121 IU/L Final  06/06/2011 84 50 - 136 Unit/L Final   ALT  Date Value Ref Range Status  02/06/2023 50 (H) 0 - 32 IU/L Final   SGPT (ALT)  Date Value Ref Range Status  06/06/2011 23 U/L Final    Comment:    12-78 NOTE: NEW REFERENCE RANGE 02/16/2011    AST  Date Value Ref Range Status  02/06/2023 28 0 - 40 IU/L Final   SGOT(AST)  Date Value Ref Range Status  06/06/2011 18 15 - 37 Unit/L Final   BUN  Date Value Ref Range Status  02/06/2023 11 6 - 24 mg/dL Final  32/35/5732 10 7 - 18 mg/dL Final   Calcium  Date Value Ref Range Status  02/06/2023 9.5 8.7 - 10.2 mg/dL Final   Calcium, Total  Date Value Ref Range Status  06/06/2011 9.1 8.5 - 10.1 mg/dL Final   CO2  Date Value Ref Range Status  02/06/2023 23 20 - 29 mmol/L Final   Co2  Date Value Ref Range Status  06/06/2011 26 21 -  32 mmol/L Final   Creatinine  Date Value Ref Range Status  06/06/2011 0.63 0.60 - 1.30 mg/dL Final   Creatinine, Ser  Date Value Ref Range Status  02/06/2023 0.80 0.57 - 1.00 mg/dL Final   Glucose  Date Value Ref Range Status  02/06/2023 94 70 - 99 mg/dL Final  20/25/4270 623 (H) 65 - 99 mg/dL Final   Potassium  Date Value Ref Range Status  02/06/2023 3.8 3.5 - 5.2 mmol/L Final  06/06/2011 3.7 3.5 - 5.1 mmol/L Final   Sodium  Date Value Ref Range Status  02/06/2023 140 134 - 144 mmol/L Final  06/06/2011 140 136 - 145 mmol/L Final   Bilirubin,Total  Date Value Ref Range Status  06/06/2011 0.5 0.2 - 1.0 mg/dL Final   Bilirubin Total  Date Value Ref Range Status  02/06/2023 0.8 0.0 - 1.2 mg/dL Final   Protein  Date Value Ref Range Status  06/06/2011 Negative NEGATIVE Final   Protein,UA  Date Value Ref Range Status  05/09/2021 Negative Negative/Trace Final   Total Protein  Date Value Ref Range Status  02/06/2023 7.5 6.0 - 8.5 g/dL Final  76/28/3151 7.8 6.4 - 8.2 g/dL Final   EGFR (African American)  Date Value Ref Range Status  06/06/2011 >60 >68mL/min Final   GFR calc Af Amer  Date Value Ref Range Status  03/23/2020 98 >59 mL/min/1.73 Final    Comment:    **In accordance with recommendations from the NKF-ASN Task force,**   [facility] is in the process of updating its eGFR calculation to the   2021 CKD-EPI creatinine equation that estimates kidney function   without a race variable.    eGFR  Date Value Ref Range Status  02/06/2023 86 >59 mL/min/1.73 Final   EGFR (Non-African Amer.)  Date Value Ref Range Status  06/06/2011 >60 >32mL/min Final    Comment:    eGFR values <42mL/min/1.73 m2 may be an indication of chronic kidney disease (CKD). Calculated eGFR, using the MRDR Study equation, is useful in  patients with stable renal function. The eGFR calculation will not be reliable in acutely ill patients when serum creatinine is changing rapidly. It is  not useful in patients on dialysis. The eGFR calculation may not be applicable to patients at the low and high extremes of body sizes, pregnant women, and vegetarians.    GFR calc non Af Amer  Date Value Ref Range Status  03/23/2020 85 >59 mL/min/1.73 Final

## 2023-04-26 NOTE — Telephone Encounter (Signed)
Requested medication (s) are due for refill today: Yes  Requested medication (s) are on the active medication list: Yes  Last refill:  1.16.24  Future visit scheduled: Yes  Notes to clinic:  Not delegated.    Requested Prescriptions  Pending Prescriptions Disp Refills   risperiDONE (RISPERDAL) 3 MG tablet [Pharmacy Med Name: risperiDONE 3 MG Oral Tablet] 90 tablet 0    Sig: Take 1 tablet by mouth once daily     Not Delegated - Psychiatry:  Antipsychotics - Second Generation (Atypical) - risperidone Failed - 04/26/2023  1:59 PM      Failed - This refill cannot be delegated      Failed - Lipid Panel in normal range within the last 12 months    Cholesterol, Total  Date Value Ref Range Status  02/06/2023 255 (H) 100 - 199 mg/dL Final   LDL Chol Calc (NIH)  Date Value Ref Range Status  02/06/2023 183 (H) 0 - 99 mg/dL Final   HDL  Date Value Ref Range Status  02/06/2023 47 >39 mg/dL Final   Triglycerides  Date Value Ref Range Status  02/06/2023 138 0 - 149 mg/dL Final         Passed - TSH in normal range and within 360 days    TSH  Date Value Ref Range Status  02/06/2023 1.050 0.450 - 4.500 uIU/mL Final         Passed - Completed PHQ-2 or PHQ-9 in the last 360 days      Passed - Last BP in normal range    BP Readings from Last 1 Encounters:  04/05/23 136/78         Passed - Last Heart Rate in normal range    Pulse Readings from Last 1 Encounters:  04/05/23 (!) 57         Passed - Valid encounter within last 6 months    Recent Outpatient Visits           3 weeks ago Acute non-recurrent frontal sinusitis   [place] [facility] [place], [place] T, NP   2 months ago Bipolar 2 disorder (HCC)   [place] [facility] [place], Corrie Dandy T, NP   1 year ago Bipolar 2 disorder (HCC)   [place] [facility] [place], Corrie Dandy T, NP   1 year ago Fecal smearing   [place] [facility] [place], [place] T, NP   1 year  ago Fecal smearing   [place] [facility] [place], B and E T, NP       Future Appointments             In 2 months Cannady, [place] T, NP [place] [facility]            Passed - CBC within normal limits and completed in the last 12 months    WBC  Date Value Ref Range Status  02/06/2023 4.3 3.4 - 10.8 x10E3/uL Final  08/08/2011 4.5 3.6 - 11.0 x10 3/mm 3 Final   RBC  Date Value Ref Range Status  02/06/2023 4.71 3.77 - 5.28 x10E6/uL Final  08/08/2011 4.06 3.80 - 5.20 X10 6/mm 3 Final   Hemoglobin  Date Value Ref Range Status  02/06/2023 13.2 11.1 - 15.9 g/dL Final   Hematocrit  Date Value Ref Range Status  02/06/2023 41.6 34.0 - 46.6 % Final   MCHC  Date Value Ref Range Status  02/06/2023 31.7 31.5 - 35.7 g/dL Final  40/98/1191  33.4 32.0 - 36.0 g/dL Final   [facility]  Date Value Ref Range Status  02/06/2023 28.0 26.6 - 33.0 pg Final  08/08/2011 29.1 26.0 - 34.0 pg Final   MCV  Date Value Ref Range Status  02/06/2023 88 79 - 97 fL Final  08/08/2011 87 80 - 100 fL Final   No results found for: "PLTCOUNTKUC", "LABPLAT", "POCPLA" RDW  Date Value Ref Range Status  02/06/2023 12.5 11.7 - 15.4 % Final  08/08/2011 12.9 11.5 - 14.5 % Final         Passed - CMP within normal limits and completed in the last 12 months    Albumin  Date Value Ref Range Status  02/06/2023 4.3 3.8 - 4.9 g/dL Final  57/84/6962 4.1 3.4 - 5.0 g/dL Final   Alkaline Phosphatase  Date Value Ref Range Status  02/06/2023 143 (H) 44 - 121 IU/L Final  06/06/2011 84 50 - 136 Unit/L Final   ALT  Date Value Ref Range Status  02/06/2023 50 (H) 0 - 32 IU/L Final   SGPT (ALT)  Date Value Ref Range Status  06/06/2011 23 U/L Final    Comment:    12-78 NOTE: NEW REFERENCE RANGE 02/16/2011    AST  Date Value Ref Range Status  02/06/2023 28 0 - 40 IU/L Final   SGOT(AST)  Date Value Ref Range Status  06/06/2011 18 15 - 37 Unit/L Final   BUN  Date Value  Ref Range Status  02/06/2023 11 6 - 24 mg/dL Final  95/28/4132 10 7 - 18 mg/dL Final   Calcium  Date Value Ref Range Status  02/06/2023 9.5 8.7 - 10.2 mg/dL Final   Calcium, Total  Date Value Ref Range Status  06/06/2011 9.1 8.5 - 10.1 mg/dL Final   CO2  Date Value Ref Range Status  02/06/2023 23 20 - 29 mmol/L Final   Co2  Date Value Ref Range Status  06/06/2011 26 21 - 32 mmol/L Final   Creatinine  Date Value Ref Range Status  06/06/2011 0.63 0.60 - 1.30 mg/dL Final   Creatinine, Ser  Date Value Ref Range Status  02/06/2023 0.80 0.57 - 1.00 mg/dL Final   Glucose  Date Value Ref Range Status  02/06/2023 94 70 - 99 mg/dL Final  44/03/270 536 (H) 65 - 99 mg/dL Final   Potassium  Date Value Ref Range Status  02/06/2023 3.8 3.5 - 5.2 mmol/L Final  06/06/2011 3.7 3.5 - 5.1 mmol/L Final   Sodium  Date Value Ref Range Status  02/06/2023 140 134 - 144 mmol/L Final  06/06/2011 140 136 - 145 mmol/L Final   Bilirubin,Total  Date Value Ref Range Status  06/06/2011 0.5 0.2 - 1.0 mg/dL Final   Bilirubin Total  Date Value Ref Range Status  02/06/2023 0.8 0.0 - 1.2 mg/dL Final   Protein  Date Value Ref Range Status  06/06/2011 Negative NEGATIVE Final   Protein,UA  Date Value Ref Range Status  05/09/2021 Negative Negative/Trace Final   Total Protein  Date Value Ref Range Status  02/06/2023 7.5 6.0 - 8.5 g/dL Final  64/40/3474 7.8 6.4 - 8.2 g/dL Final   EGFR (African American)  Date Value Ref Range Status  06/06/2011 >60 >64mL/min Final   GFR calc Af Amer  Date Value Ref Range Status  03/23/2020 98 >59 mL/min/1.73 Final    Comment:    **In accordance with recommendations from the NKF-ASN Task force,**   [facility] is in the process of updating its eGFR calculation  to the   2021 CKD-EPI creatinine equation that estimates kidney function   without a race variable.    eGFR  Date Value Ref Range Status  02/06/2023 86 >59 mL/min/1.73 Final   EGFR (Non-African  Amer.)  Date Value Ref Range Status  06/06/2011 >60 >20mL/min Final    Comment:    eGFR values <68mL/min/1.73 m2 may be an indication of chronic kidney disease (CKD). Calculated eGFR, using the MRDR Study equation, is useful in  patients with stable renal function. The eGFR calculation will not be reliable in acutely ill patients when serum creatinine is changing rapidly. It is not useful in patients on dialysis. The eGFR calculation may not be applicable to patients at the low and high extremes of body sizes, pregnant women, and vegetarians.    GFR calc non Af Amer  Date Value Ref Range Status  03/23/2020 85 >59 mL/min/1.73 Final         Signed Prescriptions Disp Refills   buPROPion (WELLBUTRIN) 100 MG tablet 90 tablet 0    Sig: Take 1 tablet by mouth once daily     Psychiatry: Antidepressants - bupropion Failed - 04/26/2023  1:59 PM      Failed - ALT in normal range and within 360 days    ALT  Date Value Ref Range Status  02/06/2023 50 (H) 0 - 32 IU/L Final   SGPT (ALT)  Date Value Ref Range Status  06/06/2011 23 U/L Final    Comment:    12-78 NOTE: NEW REFERENCE RANGE 02/16/2011          Passed - Cr in normal range and within 360 days    Creatinine  Date Value Ref Range Status  06/06/2011 0.63 0.60 - 1.30 mg/dL Final   Creatinine, Ser  Date Value Ref Range Status  02/06/2023 0.80 0.57 - 1.00 mg/dL Final         Passed - AST in normal range and within 360 days    AST  Date Value Ref Range Status  02/06/2023 28 0 - 40 IU/L Final   SGOT(AST)  Date Value Ref Range Status  06/06/2011 18 15 - 37 Unit/L Final         Passed - Last BP in normal range    BP Readings from Last 1 Encounters:  04/05/23 136/78         Passed - Valid encounter within last 6 months    Recent Outpatient Visits           3 weeks ago Acute non-recurrent frontal sinusitis   [place] [facility] [place], Corrie Dandy T, NP   2 months ago Bipolar 2 disorder (HCC)    [place] [facility] [place], Corrie Dandy T, NP   1 year ago Bipolar 2 disorder (HCC)   [place] [facility] [place], Dorie Rank, NP   1 year ago Fecal smearing   [place] [facility] [place], Corrie Dandy T, NP   1 year ago Fecal smearing   [place] [facility] [place], Dorie Rank, NP       Future Appointments             In 2 months Cannady, Dorie Rank, NP [place] [facility]

## 2023-07-13 NOTE — Patient Instructions (Signed)
 Be Involved in Caring For Your Health:  Taking Medications When medications are taken as directed, they can greatly improve your health. But if they are not taken as prescribed, they may not work. In some cases, not taking them correctly can be harmful. To help ensure your treatment remains effective and safe, understand your medications and how to take them. Bring your medications to each visit for review by your provider.  Your lab results, notes, and after visit summary will be available on My Chart. We strongly encourage you to use this feature. If lab results are abnormal the clinic will contact you with the appropriate steps. If the clinic does not contact you assume the results are satisfactory. You can always view your results on My Chart. If you have questions regarding your health or results, please contact the clinic during office hours. You can also ask questions on My Chart.  We at [facility] are grateful that you chose [place] to provide your care. We strive to provide evidence-based and compassionate care and are always looking for feedback. If you get a survey from the clinic please complete this so we can hear your opinions.  Healthy Eating, Adult Healthy eating may help you get and keep a healthy body weight, reduce the risk of chronic disease, and live a long and productive life. It is important to follow a healthy eating pattern. Your nutritional and calorie needs should be met mainly by different nutrient-rich foods. What are tips for following this plan? Reading food labels Read labels and choose the following: Reduced or low sodium products. Juices with 100% fruit juice. Foods with low saturated fats (<3 g per serving) and high polyunsaturated and monounsaturated fats. Foods with whole grains, such as whole wheat, cracked wheat, brown rice, and wild rice. Whole grains that are fortified with folic acid. This is recommended for females who are pregnant or who want  to become pregnant. Read labels and do not eat or drink the following: Foods or drinks with added sugars. These include foods that contain brown sugar, corn sweetener, corn syrup, dextrose, fructose, glucose, high-fructose corn syrup, honey, invert sugar, lactose, malt syrup, maltose, molasses, raw sugar, sucrose, trehalose, or turbinado sugar. Limit your intake of added sugars to less than 10% of your total daily calories. Do not eat more than the following amounts of added sugar per day: 6 teaspoons (25 g) for females. 9 teaspoons (38 g) for males. Foods that contain processed or refined starches and grains. Refined grain products, such as white flour, degermed cornmeal, white bread, and white rice. Shopping Choose nutrient-rich snacks, such as vegetables, whole fruits, and nuts. Avoid high-calorie and high-sugar snacks, such as potato chips, fruit snacks, and candy. Use oil-based dressings and spreads on foods instead of solid fats such as butter, margarine, sour cream, or cream cheese. Limit pre-made sauces, mixes, and "instant" products such as flavored rice, instant noodles, and ready-made pasta. Try more plant-protein sources, such as tofu, tempeh, black beans, edamame, lentils, nuts, and seeds. Explore eating plans such as the Mediterranean diet or vegetarian diet. Try heart-healthy dips made with beans and healthy fats like hummus and guacamole. Vegetables go great with these. Cooking Use oil to saut or stir-fry foods instead of solid fats such as butter, margarine, or lard. Try baking, boiling, grilling, or broiling instead of frying. Remove the fatty part of meats before cooking. Steam vegetables in water or broth. Meal planning  At meals, imagine dividing your plate into fourths: One-half of  your plate is fruits and vegetables. One-fourth of your plate is whole grains. One-fourth of your plate is protein, especially lean meats, poultry, eggs, tofu, beans, or nuts. Include  low-fat dairy as part of your daily diet. Lifestyle Choose healthy options in all settings, including home, work, school, restaurants, or stores. Prepare your food safely: Wash your hands after handling raw meats. Where you prepare food, keep surfaces clean by regularly washing with hot, soapy water. Keep raw meats separate from ready-to-eat foods, such as fruits and vegetables. Cook seafood, meat, poultry, and eggs to the recommended temperature. Get a food thermometer. Store foods at safe temperatures. In general: Keep cold foods at 76F (4.4C) or below. Keep hot foods at 176F (60C) or above. Keep your freezer at [facility] (-17.8C) or below. Foods are not safe to eat if they have been between the temperatures of 40-176F (4.4-60C) for more than 2 hours. What foods should I eat? Fruits Aim to eat 1-2 cups of fresh, canned (in natural juice), or frozen fruits each day. One cup of fruit equals 1 small apple, 1 large banana, 8 large strawberries, 1 cup (237 g) canned fruit,  cup (82 g) dried fruit, or 1 cup (240 mL) 100% juice. Vegetables Aim to eat 2-4 cups of fresh and frozen vegetables each day, including different varieties and colors. One cup of vegetables equals 1 cup (91 g) broccoli or cauliflower florets, 2 medium carrots, 2 cups (150 g) raw, leafy greens, 1 large tomato, 1 large bell pepper, 1 large sweet potato, or 1 medium white potato. Grains Aim to eat 5-10 ounce-equivalents of whole grains each day. Examples of 1 ounce-equivalent of grains include 1 slice of bread, 1 cup (40 g) ready-to-eat cereal, 3 cups (24 g) popcorn, or  cup (93 g) cooked rice. Meats and other proteins Try to eat 5-7 ounce-equivalents of protein each day. Examples of 1 ounce-equivalent of protein include 1 egg,  oz nuts (12 almonds, 24 pistachios, or 7 walnut halves), 1/4 cup (90 g) cooked beans, 6 tablespoons (90 g) hummus or 1 tablespoon (16 g) peanut butter. A cut of meat or fish that is the size of a deck  of cards is about 3-4 ounce-equivalents (85 g). Of the protein you eat each week, try to have at least 8 sounce (227 g) of seafood. This is about 2 servings per week. This includes salmon, trout, herring, sardines, and anchovies. Dairy Aim to eat 3 cup-equivalents of fat-free or low-fat dairy each day. Examples of 1 cup-equivalent of dairy include 1 cup (240 mL) milk, 8 ounces (250 g) yogurt, 1 ounces (44 g) natural cheese, or 1 cup (240 mL) fortified soy milk. Fats and oils Aim for about 5 teaspoons (21 g) of fats and oils per day. Choose monounsaturated fats, such as canola and olive oils, mayonnaise made with olive oil or avocado oil, avocados, peanut butter, and most nuts, or polyunsaturated fats, such as sunflower, corn, and soybean oils, walnuts, pine nuts, sesame seeds, sunflower seeds, and flaxseed. Beverages Aim for 6 eight-ounce glasses of water per day. Limit coffee to 3-5 eight-ounce cups per day. Limit caffeinated beverages that have added calories, such as soda and energy drinks. If you drink alcohol: Limit how much you have to: 0-1 drink a day if you are female. 0-2 drinks a day if you are female. Know how much alcohol is in your drink. In the [place] one drink is one 12 oz bottle of beer (355 mL), one 5 oz glass of wine (  148 mL), or one 1 oz glass of hard liquor (44 mL). Seasoning and other foods Try not to add too much salt to your food. Try using herbs and spices instead of salt. Try not to add sugar to food. This information is based on [place] nutrition guidelines. To learn more, visit [employer]. Exact amounts may vary. You may need different amounts. This information is not intended to replace advice given to you by your health care provider. Make sure you discuss any questions you have with your health care provider. Document Revised: 12/11/2021 Document Reviewed: 12/11/2021 Elsevier Patient Education  2024 [employer].

## 2023-07-16 ENCOUNTER — Encounter: Payer: Self-pay | Admitting: Nurse Practitioner

## 2023-07-16 ENCOUNTER — Ambulatory Visit (INDEPENDENT_AMBULATORY_CARE_PROVIDER_SITE_OTHER): Payer: Self-pay | Admitting: Nurse Practitioner

## 2023-07-16 VITALS — BP 136/84 | HR 46 | Temp 98.7°F | Ht 68.0 in | Wt 249.2 lb

## 2023-07-16 DIAGNOSIS — E782 Mixed hyperlipidemia: Secondary | ICD-10-CM | POA: Diagnosis not present

## 2023-07-16 DIAGNOSIS — Z1231 Encounter for screening mammogram for malignant neoplasm of breast: Secondary | ICD-10-CM

## 2023-07-16 DIAGNOSIS — R7303 Prediabetes: Secondary | ICD-10-CM

## 2023-07-16 DIAGNOSIS — G2401 Drug induced subacute dyskinesia: Secondary | ICD-10-CM | POA: Diagnosis not present

## 2023-07-16 DIAGNOSIS — E669 Obesity, unspecified: Secondary | ICD-10-CM

## 2023-07-16 DIAGNOSIS — F3181 Bipolar II disorder: Secondary | ICD-10-CM | POA: Diagnosis not present

## 2023-07-16 DIAGNOSIS — Z0000 Encounter for general adult medical examination without abnormal findings: Secondary | ICD-10-CM

## 2023-07-16 LAB — MICROALBUMIN, URINE WAIVED
Creatinine, Urine Waived: 200 mg/dL (ref 10–300)
Microalb, Ur Waived: 30 mg/L — ABNORMAL HIGH (ref 0–19)
Microalb/Creat Ratio: <30 mg/g (ref <30)

## 2023-07-16 LAB — BAYER DCA HB A1C WAIVED: HB A1C (BAYER DCA - WAIVED): 6 % — ABNORMAL HIGH (ref 4.8–5.6)

## 2023-07-16 MED ORDER — RISPERIDONE 3 MG PO TABS: 3.0000 mg | ORAL_TABLET | Freq: Every day | ORAL | 3 refills | Status: AC

## 2023-07-16 MED ORDER — BUPROPION HCL 100 MG PO TABS: 100.0000 mg | ORAL_TABLET | Freq: Every day | ORAL | 3 refills | Status: AC

## 2023-07-16 NOTE — Progress Notes (Deleted)
 LMP  (LMP Unknown)    Subjective:    Patient ID: Gabriella Hensley, female    DOB: 02/20/1966, 57 y.o.   MRN: [id number]  HPI: Gabriella Hensley is a 59 y.o. female presenting on 07/16/2023 for comprehensive medical examination. Current medical complaints include:none  She currently lives with: Menopausal Symptoms: no  The 10-year ASCVD risk score (Arnett DK, et al., 2019) is: 6.7%   Values used to calculate the score:     Age: 66 years     Sex: Female     Is Non-Hispanic African American: Yes     Diabetic: No     Tobacco smoker: No     Systolic Blood Pressure: 136 mmHg     Is BP treated: No     HDL Cholesterol: 47 mg/dL     Total Cholesterol: 255 mg/dL   Impaired Fasting Glucose HbA1C:  Lab Results  Component Value Date   HGBA1C 6.1 (H) 02/06/2023  Duration of elevated blood sugar:  Polydipsia: {Blank single:19197::"yes","no"} Polyuria: {Blank single:19197::"yes","no"} Weight change: {Blank single:19197::"yes","no"} Visual disturbance: {Blank single:19197::"yes","no"} Glucose Monitoring: {Blank single:19197::"yes","no"}    Accucheck frequency: {Blank single:19197::"Not Checking","Daily","BID","TID"}    Fasting glucose:     Post prandial:  Diabetic Education: {Blank single:19197::"Completed","Not Completed"} Family history of diabetes: {Blank single:19197::"yes","no"}   BIPOLAR 2 Continues on Wellbutrin  and Risperidone , has been on for years with good control. TD is well controlled at present without medication. Mood status: {Blank single:19197::"controlled","uncontrolled","better","worse","exacerbated","stable"} Satisfied with current treatment?: {Blank single:19197::"yes","no"} Symptom severity: {Blank single:19197::"mild","moderate","severe"}  Duration of current treatment : {Blank single:19197::"chronic","months","years"} Side effects: {Blank single:19197::"yes","no"} Medication compliance: {Blank single:19197::"excellent compliance","good compliance","fair  compliance","poor compliance"} Psychotherapy/counseling: {Blank single:19197::"yes","no"} {Blank single:19197::"current","in the past"} Depressed mood: {Blank single:19197::"yes","no"} Anxious mood: {Blank single:19197::"yes","no"} Anhedonia: {Blank single:19197::"yes","no"} Significant weight loss or gain: {Blank single:19197::"yes","no"} Insomnia: {Blank single:19197::"yes","no"} {Blank single:19197::"hard to fall asleep","hard to stay asleep"} Fatigue: {Blank single:19197::"yes","no"} Feelings of worthlessness or guilt: {Blank single:19197::"yes","no"} Impaired concentration/indecisiveness: {Blank single:19197::"yes","no"} Suicidal ideations: {Blank single:19197::"yes","no"} Hopelessness: {Blank single:19197::"yes","no"} Crying spells: {Blank single:19197::"yes","no"}    02/06/2023    3:09 PM 08/14/2021   11:07 AM 06/19/2021    8:25 AM 05/09/2021    3:36 PM 09/20/2020    8:07 AM  Depression screen PHQ 2/9  Decreased Interest 0 0 0 0 0  Down, Depressed, Hopeless 0 0 0 0 0  PHQ - 2 Score 0 0 0 0 0  Altered sleeping 0 0 0 0 0  Tired, decreased energy 0 0 0 0 1  Change in appetite 0 0 0 0 1  Feeling bad or failure about yourself  0 0 0 0 0  Trouble concentrating 0 0 0 0 0  Moving slowly or fidgety/restless 0 0 0 0 0  Suicidal thoughts 0 0 0 0 0  PHQ-9 Score 0 0 0 0 2  Difficult doing work/chores Not difficult at all Not difficult at all Not difficult at all Not difficult at all       02/06/2023    3:10 PM 08/14/2021   11:07 AM 06/19/2021    8:25 AM 05/09/2021    3:36 PM  GAD 7 : Generalized Anxiety Score  Nervous, Anxious, on Edge 0 0 0 0  Control/stop worrying 0 0 0 0  Worry too much - different things 1 0 0 0  Trouble relaxing 0 0 0 0  Restless 0 0 0 0  Easily annoyed or irritable 1 0 0 0  Afraid - awful might happen 0 0 0 0  Total GAD 7 Score  2 0 0 0  Anxiety Difficulty Not difficult at all Not difficult at all Not difficult at all Not difficult at all      05/09/2021     3:36 PM 05/09/2021    6:46 PM 06/19/2021    8:25 AM 08/14/2021   11:07 AM 02/06/2023    3:09 PM  Fall Risk  Falls in the past year? 0 0 0 0 0  Was there an injury with Fall? 0 0 0 0 0  Fall Risk Category Calculator 0 0 0 0 0  Fall Risk Category (Retired) Low Low Low Low   (RETIRED) Patient Fall Risk Level Low fall risk Low fall risk Low fall risk Low fall risk   Patient at Risk for Falls Due to No Fall Risks No Fall Risks No Fall Risks No Fall Risks No Fall Risks  Fall risk Follow up Falls evaluation completed Falls prevention discussed Falls evaluation completed Falls evaluation completed Falls evaluation completed    Past Medical History:  Past Medical History:  Diagnosis Date   Bipolar 1 disorder (HCC)    Depression    Hypertension     Surgical History:  Past Surgical History:  Procedure Laterality Date   APPENDECTOMY     COLONOSCOPY WITH PROPOFOL  N/A 02/14/2016   Procedure: COLONOSCOPY WITH PROPOFOL ;  Surgeon: Marnee Sink, MD;  Location: [facility] ENDOSCOPY;  Service: Endoscopy;  Laterality: N/A;   FOOT SURGERY Left 05/2020   JOINT REPLACEMENT Left    Partial    TOTAL ABDOMINAL HYSTERECTOMY      Medications:  Current Outpatient Medications on File Prior to Visit  Medication Sig   buPROPion  (WELLBUTRIN ) 100 MG tablet Take 1 tablet by mouth once daily   risperiDONE  (RISPERDAL ) 3 MG tablet Take 1 tablet by mouth once daily   No current facility-administered medications on file prior to visit.    Allergies:  Allergies  Allergen Reactions   Mushroom Extract Complex (Obsolete)     Social History:  Social History   Socioeconomic History   Marital status: Married    Spouse name: Not on file   Number of children: Not on file   Years of education: Not on file   Highest education level: Not on file  Occupational History   Not on file  Tobacco Use   Smoking status: Never   Smokeless tobacco: Never  Vaping Use   Vaping status: Never Used  Substance and Sexual Activity    Alcohol use: No   Drug use: No   Sexual activity: Yes  Other Topics Concern   Not on file  Social History Narrative   Not on file   Social Drivers of Health   Financial Resource Strain: Not on file  Food Insecurity: Not on file  Transportation Needs: Not on file  Physical Activity: Not on file  Stress: Not on file  Social Connections: Unknown (08/08/2021)   Received from [facility], [facility]   Social Network    Social Network: Not on file  Intimate Partner Violence: Unknown (06/30/2021)   Received from [facility], [facility]   HITS    Physically Hurt: Not on file    Insult or Talk Down To: Not on file    Threaten Physical Harm: Not on file    Scream or Curse: Not on file   Social History   Tobacco Use  Smoking Status Never  Smokeless Tobacco Never   Social History   Substance and Sexual Activity  Alcohol Use No  Family History:  Family History  Problem Relation Age of Onset   Hypertension Mother    Cancer Mother        ovarian   Ovarian cancer Mother 60   Breast cancer Maternal Aunt 30   Heart disease Maternal Grandfather    Hypertension Son    Sleep apnea Neg Hx     Past medical history, surgical history, medications, allergies, family history and social history reviewed with patient today and changes made to appropriate areas of the chart.   ROS All other ROS negative except what is listed above and in the HPI.      Objective:    LMP  (LMP Unknown)   Wt Readings from Last 3 Encounters:  04/05/23 246 lb 9.6 oz (111.9 kg)  02/06/23 242 lb 9.6 oz (110 kg)  04/10/22 242 lb 6.4 oz (110 kg)    Physical Exam  Results for orders placed or performed in visit on 02/06/23  CBC with Differential/Platelet   Collection Time: 02/06/23  3:51 PM  Result Value Ref Range   WBC 4.3 3.4 - 10.8 x10E3/uL   RBC 4.71 3.77 - 5.28 x10E6/uL   Hemoglobin 13.2 11.1 - 15.9 g/dL   Hematocrit 42.5 95.6 - 46.6 %   MCV 88 79 - 97 fL   MCH 28.0 26.6 - 33.0  pg   MCHC 31.7 31.5 - 35.7 g/dL   RDW 38.7 56.4 - 33.2 %   Platelets 234 150 - 450 x10E3/uL   Neutrophils 42 Not Estab. %   Lymphs 45 Not Estab. %   Monocytes 8 Not Estab. %   Eos 4 Not Estab. %   Basos 1 Not Estab. %   Neutrophils Absolute 1.8 1.4 - 7.0 x10E3/uL   Lymphocytes Absolute 2.0 0.7 - 3.1 x10E3/uL   Monocytes Absolute 0.4 0.1 - 0.9 x10E3/uL   EOS (ABSOLUTE) 0.2 0.0 - 0.4 x10E3/uL   Basophils Absolute 0.0 0.0 - 0.2 x10E3/uL   Immature Granulocytes 0 Not Estab. %   Immature Grans (Abs) 0.0 0.0 - 0.1 x10E3/uL  Comprehensive metabolic panel   Collection Time: 02/06/23  3:51 PM  Result Value Ref Range   Glucose 94 70 - 99 mg/dL   BUN 11 6 - 24 mg/dL   Creatinine, Ser 9.51 0.57 - 1.00 mg/dL   eGFR 86 >88 CZ/YSA/6.30   BUN/Creatinine Ratio 14 9 - 23   Sodium 140 134 - 144 mmol/L   Potassium 3.8 3.5 - 5.2 mmol/L   Chloride 101 96 - 106 mmol/L   CO2 23 20 - 29 mmol/L   Calcium 9.5 8.7 - 10.2 mg/dL   Total Protein 7.5 6.0 - 8.5 g/dL   Albumin 4.3 3.8 - 4.9 g/dL   Globulin, Total 3.2 1.5 - 4.5 g/dL   Bilirubin Total 0.8 0.0 - 1.2 mg/dL   Alkaline Phosphatase 143 (H) 44 - 121 IU/L   AST 28 0 - 40 IU/L   ALT 50 (H) 0 - 32 IU/L  Lipid Panel w/o Chol/HDL Ratio   Collection Time: 02/06/23  3:51 PM  Result Value Ref Range   Cholesterol, Total 255 (H) 100 - 199 mg/dL   Triglycerides 160 0 - 149 mg/dL   HDL 47 >10 mg/dL   VLDL Cholesterol Cal 25 5 - 40 mg/dL   LDL Chol Calc (NIH) 932 (H) 0 - 99 mg/dL  TSH   Collection Time: 02/06/23  3:51 PM  Result Value Ref Range   TSH 1.050 0.450 - 4.500 uIU/mL  HgB A1c   Collection Time: 02/06/23  3:51 PM  Result Value Ref Range   Hgb A1c MFr Bld 6.1 (H) 4.8 - 5.6 %   Est. average glucose Bld gHb Est-mCnc 128 mg/dL      Assessment & Plan:   Problem List Items Addressed This Visit       Nervous and Auditory   Tardive dyskinesia     Other   Prediabetes   Relevant Orders   Bayer DCA Hb A1c Waived   Microalbumin, Urine  Waived   Obesity (BMI 35.0-39.9 without comorbidity)   Hyperlipidemia   Relevant Orders   Comprehensive metabolic panel with GFR   Lipid Panel w/o Chol/HDL Ratio   Bipolar 2 disorder (HCC) - Primary   Relevant Orders   Prolactin   CBC with Differential/Platelet   TSH   Other Visit Diagnoses       Encounter for screening mammogram for malignant neoplasm of breast       Mammogram ordered and instructed how to schedule.   Relevant Orders   MM 3D SCREENING MAMMOGRAM BILATERAL BREAST     Encounter for annual physical exam       Annual physical today with labs and health maintenance reviewed, discussed with patient.        Follow up plan: No follow-ups on file.   LABORATORY TESTING:  - Pap smear: not applicable  IMMUNIZATIONS:   - Tdap: Tetanus vaccination status reviewed: last tetanus booster within 10 years. - Influenza: Up to date - Pneumovax: Not applicable - Prevnar: Not applicable - COVID: Refused - HPV: Not applicable - Shingrix vaccine: Refused  SCREENING: -Mammogram: Ordered today  - Colonoscopy: Up to date  - Bone Density: Not applicable  -Hearing Test: Not applicable  -Spirometry: Not applicable   PATIENT COUNSELING:   Advised to take 1 mg of folate supplement per day if capable of pregnancy.   Sexuality: Discussed sexually transmitted diseases, partner selection, use of condoms, avoidance of unintended pregnancy  and contraceptive alternatives.   Advised to avoid cigarette smoking.  I discussed with the patient that most people either abstain from alcohol or drink within safe limits (<=14/week and <=4 drinks/occasion for males, <=7/weeks and <= 3 drinks/occasion for females) and that the risk for alcohol disorders and other health effects rises proportionally with the number of drinks per week and how often a drinker exceeds daily limits.  Discussed cessation/primary prevention of drug use and availability of treatment for abuse.   Diet: Encouraged to  adjust caloric intake to maintain  or achieve ideal body weight, to reduce intake of dietary saturated fat and total fat, to limit sodium intake by avoiding high sodium foods and not adding table salt, and to maintain adequate dietary potassium and calcium preferably from fresh fruits, vegetables, and low-fat dairy products.    Stressed the importance of regular exercise  Injury prevention: Discussed safety belts, safety helmets, smoke detector, smoking near bedding or upholstery.   Dental health: Discussed importance of regular tooth brushing, flossing, and dental visits.    NEXT PREVENTATIVE PHYSICAL DUE IN 1 YEAR. No follow-ups on file.

## 2023-07-16 NOTE — Assessment & Plan Note (Signed)
 Stable with no symptoms. Continue current medication regimen as patient doesn't wish to change medications. Labs today. Return in 6 months.

## 2023-07-16 NOTE — Assessment & Plan Note (Signed)
 BMI 37.89. Joining [employer] and beginning Zepbound. Recommended to eat smaller, more frequent meals consisting of high protein, low fat options. Exercise 5 days per week for at least 30 per day. Weight loss goal of 10-15 lb in the next 3 months. Patient verbalized understanding and motivation to adhere to these recommendations.

## 2023-07-16 NOTE — Assessment & Plan Note (Signed)
 Chronic, stable. Denies SI/HI. Continue the current medication regimen. Doesn't wish to change medications as this has been stable for year. No symptoms reported at this time. Labs today. Return in 6 months.

## 2023-07-16 NOTE — Progress Notes (Signed)
 BP 136/84 (BP Location: Left Arm, Cuff Size: Normal)   Pulse (!) 46   Temp 98.7 F (37.1 C) (Oral)   Ht 5\' 8"  (1.727 m)   Wt 249 lb 3.2 oz (113 kg)   LMP  (LMP Unknown)   SpO2 99%   BMI 37.89 kg/m    Subjective:    Patient ID: Gabriella Hensley, female    DOB: Jan 19, 1966, 58 y.o.   MRN: [id number]  HPI: Gabriella Hensley is a 58 y.o. female presenting on 07/16/2023 for comprehensive medical examination. Current medical complaints include:none  She currently lives with: Menopausal Symptoms: no  The 10-year ASCVD risk score (Arnett DK, et al., 2019) is: 6.7%   Values used to calculate the score:     Age: 87 years     Sex: Female     Is Non-Hispanic African American: Yes     Diabetic: No     Tobacco smoker: No     Systolic Blood Pressure: 136 mmHg     Is BP treated: No     HDL Cholesterol: 47 mg/dL     Total Cholesterol: 255 mg/dL   Impaired Fasting Glucose HbA1C:  Lab Results  Component Value Date   HGBA1C 6.0 (H) 07/16/2023  Duration of elevated blood sugar:  Polydipsia: no Polyuria: no Weight change: no Visual disturbance: no Glucose Monitoring: no    Accucheck frequency: Not Checking    Fasting glucose:     Post prandial:  Diabetic Education: Not Completed Family history of diabetes: yes   BIPOLAR 2 Continues on Wellbutrin  and Risperidone , has been on for years with good control. TD is well controlled at present without medication. Mood status: stable Satisfied with current treatment?: yes Symptom severity: mild  Duration of current treatment : chronic Side effects: no Medication compliance: good compliance Psychotherapy/counseling: none Depressed mood: no Anxious mood: no Anhedonia: no Significant weight loss or gain: no Insomnia: none Fatigue: no Feelings of worthlessness or guilt: no Impaired concentration/indecisiveness: no Suicidal ideations: no Hopelessness: no Crying spells: no    07/16/2023    3:57 PM 02/06/2023    3:09 PM 08/14/2021   11:07  AM 06/19/2021    8:25 AM 05/09/2021    3:36 PM  Depression screen PHQ 2/9  Decreased Interest 0 0 0 0 0  Down, Depressed, Hopeless 0 0 0 0 0  PHQ - 2 Score 0 0 0 0 0  Altered sleeping 0 0 0 0 0  Tired, decreased energy 0 0 0 0 0  Change in appetite 0 0 0 0 0  Feeling bad or failure about yourself  0 0 0 0 0  Trouble concentrating 0 0 0 0 0  Moving slowly or fidgety/restless 0 0 0 0 0  Suicidal thoughts 0 0 0 0 0  PHQ-9 Score 0 0 0 0 0  Difficult doing work/chores Not difficult at all Not difficult at all Not difficult at all Not difficult at all Not difficult at all      07/16/2023    3:58 PM 02/06/2023    3:10 PM 08/14/2021   11:07 AM 06/19/2021    8:25 AM  GAD 7 : Generalized Anxiety Score  Nervous, Anxious, on Edge 0 0 0 0  Control/stop worrying 0 0 0 0  Worry too much - different things 0 1 0 0  Trouble relaxing 0 0 0 0  Restless 0 0 0 0  Easily annoyed or irritable 0 1 0 0  Afraid - awful  might happen 0 0 0 0  Total GAD 7 Score 0 2 0 0  Anxiety Difficulty Not difficult at all Not difficult at all Not difficult at all Not difficult at all      05/09/2021    6:46 PM 06/19/2021    8:25 AM 08/14/2021   11:07 AM 02/06/2023    3:09 PM 07/16/2023    3:57 PM  Fall Risk  Falls in the past year? 0 0 0 0 0  Was there an injury with Fall? 0 0 0 0 0  Fall Risk Category Calculator 0 0 0 0 0  Fall Risk Category (Retired) Low Low Low    (RETIRED) Patient Fall Risk Level Low fall risk Low fall risk Low fall risk    Patient at Risk for Falls Due to No Fall Risks No Fall Risks No Fall Risks No Fall Risks No Fall Risks  Fall risk Follow up Falls prevention discussed Falls evaluation completed Falls evaluation completed Falls evaluation completed Falls evaluation completed    Past Medical History:  Past Medical History:  Diagnosis Date   Bipolar 1 disorder (HCC)    Depression    Hypertension     Surgical History:  Past Surgical History:  Procedure Laterality Date   APPENDECTOMY      COLONOSCOPY WITH PROPOFOL  N/A 02/14/2016   Procedure: COLONOSCOPY WITH PROPOFOL ;  Surgeon: Marnee Sink, MD;  Location: [facility] ENDOSCOPY;  Service: Endoscopy;  Laterality: N/A;   FOOT SURGERY Left 05/2020   JOINT REPLACEMENT Left    Partial    TOTAL ABDOMINAL HYSTERECTOMY      Medications:  No current outpatient medications on file prior to visit.   No current facility-administered medications on file prior to visit.    Allergies:  Allergies  Allergen Reactions   Mushroom Extract Complex (Obsolete)     Social History:  Social History   Socioeconomic History   Marital status: Married    Spouse name: Not on file   Number of children: Not on file   Years of education: Not on file   Highest education level: Not on file  Occupational History   Not on file  Tobacco Use   Smoking status: Never   Smokeless tobacco: Never  Vaping Use   Vaping status: Never Used  Substance and Sexual Activity   Alcohol use: No   Drug use: No   Sexual activity: Yes  Other Topics Concern   Not on file  Social History Narrative   Not on file   Social Drivers of Health   Financial Resource Strain: Low Risk  (07/16/2023)   Overall Financial Resource Strain (CARDIA)    Difficulty of Paying Living Expenses: Not hard at all  Food Insecurity: No Food Insecurity (07/16/2023)   Hunger Vital Sign    Worried About Running Out of Food in the Last Year: Never true    Ran Out of Food in the Last Year: Never true  Transportation Needs: No Transportation Needs (07/16/2023)   PRAPARE - Administrator, Civil Service (Medical): No    Lack of Transportation (Non-Medical): No  Physical Activity: Inactive (07/16/2023)   Exercise Vital Sign    Days of Exercise per Week: 0 days    Minutes of Exercise per Session: 0 min  Stress: No Stress Concern Present (07/16/2023)   [employer] of Occupational Health - Occupational Stress Questionnaire    Feeling of Stress : Not at all  Social Connections:  Moderately Integrated (07/16/2023)   Social  Connection and Isolation Panel [NHANES]    Frequency of Communication with Friends and Family: More than three times a week    Frequency of Social Gatherings with Friends and Family: Once a week    Attends Religious Services: More than 4 times per year    Active Member of [employer] or Organizations: No    Attends Banker Meetings: Never    Marital Status: Married  Catering manager Violence: Not At Risk (07/16/2023)   Humiliation, Afraid, Rape, and Kick questionnaire    Fear of Current or Ex-Partner: No    Emotionally Abused: No    Physically Abused: No    Sexually Abused: No   Social History   Tobacco Use  Smoking Status Never  Smokeless Tobacco Never   Social History   Substance and Sexual Activity  Alcohol Use No    Family History:  Family History  Problem Relation Age of Onset   Hypertension Mother    Cancer Mother        ovarian   Ovarian cancer Mother 61   Breast cancer Maternal Aunt 30   Heart disease Maternal Grandfather    Hypertension Son    Sleep apnea Neg Hx     Past medical history, surgical history, medications, allergies, family history and social history reviewed with patient today and changes made to appropriate areas of the chart.   ROS All other ROS negative except what is listed above and in the HPI.      Objective:    BP 136/84 (BP Location: Left Arm, Cuff Size: Normal)   Pulse (!) 46   Temp 98.7 F (37.1 C) (Oral)   Ht 5\' 8"  (1.727 m)   Wt 249 lb 3.2 oz (113 kg)   LMP  (LMP Unknown)   SpO2 99%   BMI 37.89 kg/m   Wt Readings from Last 3 Encounters:  07/16/23 249 lb 3.2 oz (113 kg)  04/05/23 246 lb 9.6 oz (111.9 kg)  02/06/23 242 lb 9.6 oz (110 kg)    Physical Exam Vitals and nursing note reviewed.  Constitutional:      General: She is awake. She is not in acute distress.    Appearance: She is well-developed and well-groomed. She is not ill-appearing or toxic-appearing.  HENT:      Head: Normocephalic and atraumatic.     Right Ear: Hearing, tympanic membrane, ear canal and external ear normal. No drainage.     Left Ear: Hearing, tympanic membrane, ear canal and external ear normal. No drainage.     Nose: Nose normal.     Right Sinus: No maxillary sinus tenderness or frontal sinus tenderness.     Left Sinus: No maxillary sinus tenderness or frontal sinus tenderness.     Mouth/Throat:     Mouth: Mucous membranes are moist.     Pharynx: Oropharynx is clear. Uvula midline. No pharyngeal swelling, oropharyngeal exudate or posterior oropharyngeal erythema.  Eyes:     General: Lids are normal.        Right eye: No discharge.        Left eye: No discharge.     Extraocular Movements: Extraocular movements intact.     Conjunctiva/sclera: Conjunctivae normal.     Pupils: Pupils are equal, round, and reactive to light.     Visual Fields: Right eye visual fields normal and left eye visual fields normal.  Neck:     Thyroid : No thyromegaly.     Vascular: No carotid bruit.  Trachea: Trachea normal.  Cardiovascular:     Rate and Rhythm: Normal rate and regular rhythm.     Heart sounds: Normal heart sounds. No murmur heard.    No gallop.  Pulmonary:     Effort: Pulmonary effort is normal. No accessory muscle usage or respiratory distress.     Breath sounds: Normal breath sounds.  Abdominal:     General: Bowel sounds are normal.     Palpations: Abdomen is soft. There is no hepatomegaly or splenomegaly.     Tenderness: There is no abdominal tenderness.  Musculoskeletal:        General: Normal range of motion.     Cervical back: Normal range of motion and neck supple.     Right lower leg: No edema.     Left lower leg: No edema.  Lymphadenopathy:     Head:     Right side of head: No submental, submandibular, tonsillar, preauricular or posterior auricular adenopathy.     Left side of head: No submental, submandibular, tonsillar, preauricular or posterior auricular  adenopathy.     Cervical: No cervical adenopathy.  Skin:    General: Skin is warm and dry.     Capillary Refill: Capillary refill takes less than 2 seconds.     Findings: No rash.  Neurological:     Mental Status: She is alert and oriented to person, place, and time.     Gait: Gait is intact.     Deep Tendon Reflexes: Reflexes are normal and symmetric.     Reflex Scores:      Brachioradialis reflexes are 2+ on the right side and 2+ on the left side.      Patellar reflexes are 2+ on the right side and 2+ on the left side. Psychiatric:        Attention and Perception: Attention normal.        Mood and Affect: Mood normal.        Speech: Speech normal.        Behavior: Behavior normal. Behavior is cooperative.        Thought Content: Thought content normal.        Judgment: Judgment normal.     Results for orders placed or performed in visit on 07/16/23  Bayer DCA Hb A1c Waived   Collection Time: 07/16/23  3:59 PM  Result Value Ref Range   HB A1C (BAYER DCA - WAIVED) 6.0 (H) 4.8 - 5.6 %  Microalbumin, Urine Waived   Collection Time: 07/16/23  3:59 PM  Result Value Ref Range   Microalb, Ur Waived 30 (H) 0 - 19 mg/L   Creatinine, Urine Waived 200 10 - 300 mg/dL   Microalb/Creat Ratio <30 <30 mg/g      Assessment & Plan:   Problem List Items Addressed This Visit       Nervous and Auditory   Tardive dyskinesia   Stable with no symptoms. Continue current medication regimen as patient doesn't wish to change medications. Labs today. Return in 6 months.        Other   Prediabetes   Ongoing. A1c 6.0% today. Continue to focus on diet and exercise. Recommended choosing healthier options for meals and snacks. Return in 6 months.      Relevant Orders   Bayer DCA Hb A1c Waived (Completed)   Microalbumin, Urine Waived (Completed)   Obesity (BMI 35.0-39.9 without comorbidity)   BMI 37.89. Joining Weight [employer] and beginning Zepbound. Recommended to eat smaller, more frequent  meals  consisting of high protein, low fat options. Exercise 5 days per week for at least 30 per day. Weight loss goal of 10-15 lb in the next 3 months. Patient verbalized understanding and motivation to adhere to these recommendations.      Hyperlipidemia   Chronic, ongoing. Continue to focus on diet. Labs: Lipid and CMP. Return in 6 months.      Relevant Orders   Comprehensive metabolic panel with GFR   Lipid Panel w/o Chol/HDL Ratio   Bipolar 2 disorder (HCC) - Primary   Chronic, stable. Denies SI/HI. Continue the current medication regimen. Doesn't wish to change medications as this has been stable for year. No symptoms reported at this time. Labs today. Return in 6 months.      Relevant Medications   buPROPion  (WELLBUTRIN ) 100 MG tablet   Other Relevant Orders   Prolactin   CBC with Differential/Platelet   TSH   Other Visit Diagnoses       Encounter for screening mammogram for malignant neoplasm of breast       Mammogram ordered and instructed how to schedule.   Relevant Orders   MM 3D SCREENING MAMMOGRAM BILATERAL BREAST     Encounter for annual physical exam       Annual physical today with labs and health maintenance reviewed, discussed with patient.        Follow up plan: Return in about 6 months (around 01/15/2024) for HTN/HLD, MOOD, IFG.   LABORATORY TESTING:  - Pap smear: not applicable  IMMUNIZATIONS:   - Tdap: Tetanus vaccination status reviewed: last tetanus booster within 10 years. - Influenza: Up to date - Pneumovax: Not applicable - Prevnar: Not applicable - COVID: Refused - HPV: Not applicable - Shingrix vaccine: Refused  SCREENING: -Mammogram: Ordered today  - Colonoscopy: Up to date  - Bone Density: Not applicable  -Hearing Test: Not applicable  -Spirometry: Not applicable   PATIENT COUNSELING:   Advised to take 1 mg of folate supplement per day if capable of pregnancy.   Sexuality: Discussed sexually transmitted diseases, partner  selection, use of condoms, avoidance of unintended pregnancy  and contraceptive alternatives.   Advised to avoid cigarette smoking.  I discussed with the patient that most people either abstain from alcohol or drink within safe limits (<=14/week and <=4 drinks/occasion for males, <=7/weeks and <= 3 drinks/occasion for females) and that the risk for alcohol disorders and other health effects rises proportionally with the number of drinks per week and how often a drinker exceeds daily limits.  Discussed cessation/primary prevention of drug use and availability of treatment for abuse.   Diet: Encouraged to adjust caloric intake to maintain  or achieve ideal body weight, to reduce intake of dietary saturated fat and total fat, to limit sodium intake by avoiding high sodium foods and not adding table salt, and to maintain adequate dietary potassium and calcium preferably from fresh fruits, vegetables, and low-fat dairy products.    Stressed the importance of regular exercise  Injury prevention: Discussed safety belts, safety helmets, smoke detector, smoking near bedding or upholstery.   Dental health: Discussed importance of regular tooth brushing, flossing, and dental visits.    NEXT PREVENTATIVE PHYSICAL DUE IN 1 YEAR. Return in about 6 months (around 01/15/2024) for HTN/HLD, MOOD, IFG.

## 2023-07-16 NOTE — Assessment & Plan Note (Addendum)
 Chronic, ongoing. Continue to focus on diet. Labs: Lipid and CMP. Return in 6 months.

## 2023-07-16 NOTE — Assessment & Plan Note (Signed)
 Ongoing. A1c 6.0% today. Continue to focus on diet and exercise. Recommended choosing healthier options for meals and snacks. Return in 6 months.

## 2023-07-16 NOTE — Progress Notes (Deleted)
 BP 136/84 (BP Location: Left Arm, Cuff Size: Normal)   Pulse (!) 46   Temp 98.7 F (37.1 C) (Oral)   Ht 5\' 8"  (1.727 m)   Wt 249 lb 3.2 oz (113 kg)   LMP  (LMP Unknown)   SpO2 99%   BMI 37.89 kg/m    Subjective:    Patient ID: Gabriella Hensley, female    DOB: [birth year], 58 y.o.   MRN: [id number]  NOTE WRITTEN BY DNP STUDENT.  ASSESSMENT AND PLAN OF CARE REVIEWED WITH STUDENT, AGREE WITH ABOVE FINDINGS AND PLAN.   HPI: Gabriella Hensley is a 58 y.o. female presenting on 07/16/2023 for comprehensive medical examination. Current medical complaints include: joining [employer].  She currently lives with: spouse Menopausal Symptoms: no  The 10-year ASCVD risk score (Arnett DK, et al., 2019) is: 6.7%   Values used to calculate the score:     Age: 37 years     Sex: Female     Is Non-Hispanic African American: Yes     Diabetic: No     Tobacco smoker: No     Systolic Blood Pressure: 136 mmHg     Is BP treated: No     HDL Cholesterol: 47 mg/dL     Total Cholesterol: 255 mg/dL  Impaired Fasting Glucose HbA1C:  Lab Results  Component Value Date   HGBA1C 6.0 (H) 07/16/2023  Duration of elevated blood sugar:  Polydipsia: no Polyuria: no Weight change: no Visual disturbance: no Glucose Monitoring: no    Accucheck frequency:  rarely Diabetic Education: Completed Family history of diabetes: no   BIPOLAR 2 Continues on Wellbutrin  and Risperidone , has been on for years with good control. TD is well controlled at present without medication. Mood status: controlled Satisfied with current treatment?: yes Symptom severity: mild Duration of current treatment : chronic Side effects: no Medication compliance: good compliance Psychotherapy/counseling: no  Depressed mood: no Anxious mood: no Anhedonia: no Significant weight loss or gain: no Insomnia: no  Fatigue: no Feelings of worthlessness or guilt: no Impaired concentration/indecisiveness: no Suicidal ideations:  no Hopelessness: no Crying spells: no    07/16/2023    3:57 PM 02/06/2023    3:09 PM 08/14/2021   11:07 AM 06/19/2021    8:25 AM 05/09/2021    3:36 PM  Depression screen PHQ 2/9  Decreased Interest 0 0 0 0 0  Down, Depressed, Hopeless 0 0 0 0 0  PHQ - 2 Score 0 0 0 0 0  Altered sleeping 0 0 0 0 0  Tired, decreased energy 0 0 0 0 0  Change in appetite 0 0 0 0 0  Feeling bad or failure about yourself  0 0 0 0 0  Trouble concentrating 0 0 0 0 0  Moving slowly or fidgety/restless 0 0 0 0 0  Suicidal thoughts 0 0 0 0 0  PHQ-9 Score 0 0 0 0 0  Difficult doing work/chores Not difficult at all Not difficult at all Not difficult at all Not difficult at all Not difficult at all      07/16/2023    3:58 PM 02/06/2023    3:10 PM 08/14/2021   11:07 AM 06/19/2021    8:25 AM  GAD 7 : Generalized Anxiety Score  Nervous, Anxious, on Edge 0 0 0 0  Control/stop worrying 0 0 0 0  Worry too much - different things 0 1 0 0  Trouble relaxing 0 0 0 0  Restless 0 0 0 0  Easily annoyed or irritable 0 1 0 0  Afraid - awful might happen 0 0 0 0  Total GAD 7 Score 0 2 0 0  Anxiety Difficulty Not difficult at all Not difficult at all Not difficult at all Not difficult at all      05/09/2021    6:46 PM 06/19/2021    8:25 AM 08/14/2021   11:07 AM 02/06/2023    3:09 PM 07/16/2023    3:57 PM  Fall Risk  Falls in the past year? 0 0 0 0 0  Was there an injury with Fall? 0 0 0 0 0  Fall Risk Category Calculator 0 0 0 0 0  Fall Risk Category (Retired) Low Low Low    (RETIRED) Patient Fall Risk Level Low fall risk Low fall risk Low fall risk    Patient at Risk for Falls Due to No Fall Risks No Fall Risks No Fall Risks No Fall Risks No Fall Risks  Fall risk Follow up Falls prevention discussed Falls evaluation completed Falls evaluation completed Falls evaluation completed Falls evaluation completed    Past Medical History:  Past Medical History:  Diagnosis Date   Bipolar 1 disorder (HCC)    Depression     Hypertension     Surgical History:  Past Surgical History:  Procedure Laterality Date   APPENDECTOMY     COLONOSCOPY WITH PROPOFOL  N/A 02/14/2016   Procedure: COLONOSCOPY WITH PROPOFOL ;  Surgeon: Marnee Sink, MD;  Location: [facility] ENDOSCOPY;  Service: Endoscopy;  Laterality: N/A;   FOOT SURGERY Left 05/2020   JOINT REPLACEMENT Left    Partial    TOTAL ABDOMINAL HYSTERECTOMY      Medications:  Current Outpatient Medications on File Prior to Visit  Medication Sig   buPROPion  (WELLBUTRIN ) 100 MG tablet Take 1 tablet by mouth once daily   risperiDONE  (RISPERDAL ) 3 MG tablet Take 1 tablet by mouth once daily   No current facility-administered medications on file prior to visit.    Allergies:  Allergies  Allergen Reactions   Mushroom Extract Complex (Obsolete)     Social History:  Social History   Socioeconomic History   Marital status: Married    Spouse name: Not on file   Number of children: Not on file   Years of education: Not on file   Highest education level: Not on file  Occupational History   Not on file  Tobacco Use   Smoking status: Never   Smokeless tobacco: Never  Vaping Use   Vaping status: Never Used  Substance and Sexual Activity   Alcohol use: No   Drug use: No   Sexual activity: Yes  Other Topics Concern   Not on file  Social History Narrative   Not on file   Social Drivers of Health   Financial Resource Strain: Low Risk  (07/16/2023)   Overall Financial Resource Strain (CARDIA)    Difficulty of Paying Living Expenses: Not hard at all  Food Insecurity: No Food Insecurity (07/16/2023)   Hunger Vital Sign    Worried About Running Out of Food in the Last Year: Never true    Ran Out of Food in the Last Year: Never true  Transportation Needs: No Transportation Needs (07/16/2023)   PRAPARE - Administrator, Civil Service (Medical): No    Lack of Transportation (Non-Medical): No  Physical Activity: Inactive (07/16/2023)   Exercise Vital  Sign    Days of Exercise per Week: 0 days    Minutes of  Exercise per Session: 0 min  Stress: No Stress Concern Present (07/16/2023)   [employer] of Occupational Health - Occupational Stress Questionnaire    Feeling of Stress : Not at all  Social Connections: Moderately Integrated (07/16/2023)   Social Connection and Isolation Panel [NHANES]    Frequency of Communication with Friends and Family: More than three times a week    Frequency of Social Gatherings with Friends and Family: Once a week    Attends Religious Services: More than 4 times per year    Active Member of [employer] or Organizations: No    Attends Banker Meetings: Never    Marital Status: Married  Catering manager Violence: Not At Risk (07/16/2023)   Humiliation, Afraid, Rape, and Kick questionnaire    Fear of Current or Ex-Partner: No    Emotionally Abused: No    Physically Abused: No    Sexually Abused: No   Social History   Tobacco Use  Smoking Status Never  Smokeless Tobacco Never   Social History   Substance and Sexual Activity  Alcohol Use No    Family History:  Family History  Problem Relation Age of Onset   Hypertension Mother    Cancer Mother        ovarian   Ovarian cancer Mother 29   Breast cancer Maternal Aunt 30   Heart disease Maternal Grandfather    Hypertension Son    Sleep apnea Neg Hx     Past medical history, surgical history, medications, allergies, family history and social history reviewed with patient today and changes made to appropriate areas of the chart.   ROS All other ROS negative except what is listed above and in the HPI.      Objective:    BP 136/84 (BP Location: Left Arm, Cuff Size: Normal)   Pulse (!) 46   Temp 98.7 F (37.1 C) (Oral)   Ht 5\' 8"  (1.727 m)   Wt 249 lb 3.2 oz (113 kg)   LMP  (LMP Unknown)   SpO2 99%   BMI 37.89 kg/m   Wt Readings from Last 3 Encounters:  07/16/23 249 lb 3.2 oz (113 kg)  04/05/23 246 lb 9.6 oz (111.9 kg)   02/06/23 242 lb 9.6 oz (110 kg)    Physical Exam Vitals and nursing note reviewed.  Constitutional:      General: She is awake. She is not in acute distress.    Appearance: She is well-developed and well-groomed. She is not ill-appearing or toxic-appearing.  HENT:     Head: Normocephalic and atraumatic.     Right Ear: Hearing, tympanic membrane, ear canal and external ear normal. No drainage.     Left Ear: Hearing, tympanic membrane, ear canal and external ear normal. No drainage.     Nose: Nose normal.     Right Sinus: No maxillary sinus tenderness or frontal sinus tenderness.     Left Sinus: No maxillary sinus tenderness or frontal sinus tenderness.     Mouth/Throat:     Mouth: Mucous membranes are moist.     Pharynx: Oropharynx is clear. Uvula midline. No pharyngeal swelling, oropharyngeal exudate or posterior oropharyngeal erythema.  Eyes:     General: Lids are normal.        Right eye: No discharge.        Left eye: No discharge.     Extraocular Movements: Extraocular movements intact.     Conjunctiva/sclera: Conjunctivae normal.     Pupils: Pupils are equal, round,  and reactive to light.     Visual Fields: Right eye visual fields normal and left eye visual fields normal.  Neck:     Thyroid : No thyromegaly.     Vascular: No carotid bruit.     Trachea: Trachea normal.  Cardiovascular:     Rate and Rhythm: Normal rate and regular rhythm.     Heart sounds: Normal heart sounds. No murmur heard.    No gallop.  Pulmonary:     Effort: Pulmonary effort is normal. No accessory muscle usage or respiratory distress.     Breath sounds: Normal breath sounds.  Abdominal:     General: Bowel sounds are normal.     Palpations: Abdomen is soft. There is no hepatomegaly or splenomegaly.     Tenderness: There is no abdominal tenderness.  Musculoskeletal:        General: Normal range of motion.     Cervical back: Normal range of motion and neck supple.     Right lower leg: No edema.      Left lower leg: No edema.  Lymphadenopathy:     Head:     Right side of head: No submental, submandibular, tonsillar, preauricular or posterior auricular adenopathy.     Left side of head: No submental, submandibular, tonsillar, preauricular or posterior auricular adenopathy.     Cervical: No cervical adenopathy.  Skin:    General: Skin is warm and dry.     Capillary Refill: Capillary refill takes less than 2 seconds.     Findings: No rash.  Neurological:     Mental Status: She is alert and oriented to person, place, and time.     Gait: Gait is intact.     Deep Tendon Reflexes: Reflexes are normal and symmetric.     Reflex Scores:      Brachioradialis reflexes are 2+ on the right side and 2+ on the left side.      Patellar reflexes are 2+ on the right side and 2+ on the left side. Psychiatric:        Attention and Perception: Attention normal.        Mood and Affect: Mood normal.        Speech: Speech normal.        Behavior: Behavior normal. Behavior is cooperative.        Thought Content: Thought content normal.        Judgment: Judgment normal.     Results for orders placed or performed in visit on 07/16/23  Bayer DCA Hb A1c Waived   Collection Time: 07/16/23  3:59 PM  Result Value Ref Range   HB A1C (BAYER DCA - WAIVED) 6.0 (H) 4.8 - 5.6 %  Microalbumin, Urine Waived   Collection Time: 07/16/23  3:59 PM  Result Value Ref Range   Microalb, Ur Waived 30 (H) 0 - 19 mg/L   Creatinine, Urine Waived 200 10 - 300 mg/dL   Microalb/Creat Ratio <30 <30 mg/g      Assessment & Plan:   Problem List Items Addressed This Visit       Nervous and Auditory   Tardive dyskinesia   Stable with no symptoms. Continue current medication regimen as patient doesn't wish to change medications. Labs today. Return in 6 months.        Other   Hyperlipidemia   Chronic, ongoing. Continue to focus on diet. Labs: Lipid and CMP. Return in 6 months.      Relevant Orders   Comprehensive  metabolic  panel with GFR   Lipid Panel w/o Chol/HDL Ratio   Bipolar 2 disorder (HCC) - Primary   Chronic, stable. Denies SI/HI. Continue the current medication regimen. Doesn't wish to change medications as this has been stable for year. No symptoms reported at this time. Labs today. Return in 6 months.      Relevant Orders   Prolactin   CBC with Differential/Platelet   TSH   Obesity (BMI 35.0-39.9 without comorbidity)   BMI 37.89. Joining [employer] and beginning Zepbound. Recommended to eat smaller, more frequent meals consisting of high protein, low fat options. Exercise 5 days per week for at least 30 per day. Weight loss goal of 10-15 lb in the next 3 months. Patient verbalized understanding and motivation to adhere to these recommendations.      Prediabetes   Ongoing. A1c 6.0% today. Continue to focus on diet and exercise. Recommended choosing healthier options for meals and snacks. Return in 6 months.      Relevant Orders   Bayer DCA Hb A1c Waived (Completed)   Microalbumin, Urine Waived (Completed)   Other Visit Diagnoses       Encounter for screening mammogram for malignant neoplasm of breast       Mammogram ordered and instructed how to schedule.   Relevant Orders   MM 3D SCREENING MAMMOGRAM BILATERAL BREAST     Encounter for annual physical exam       Annual physical today with labs and health maintenance reviewed, discussed with patient.        Follow up plan: Return in about 6 months (around 01/15/2024) for HTN/HLD, MOOD, IFG.   LABORATORY TESTING:  - Pap smear: not applicable  IMMUNIZATIONS:   - Tdap: Tetanus vaccination status reviewed: last tetanus booster within 10 years. - Influenza: Up to date - Pneumovax: Not applicable - Prevnar: Not applicable - COVID: Refused - HPV: Not applicable - Shingrix vaccine: Refused  SCREENING: -Mammogram: Ordered today  - Colonoscopy: Up to date  - Bone Density: Not applicable  -Hearing Test: Not applicable   -Spirometry: Not applicable   PATIENT COUNSELING:   Advised to take 1 mg of folate supplement per day if capable of pregnancy.   Sexuality: Discussed sexually transmitted diseases, partner selection, use of condoms, avoidance of unintended pregnancy  and contraceptive alternatives.   Advised to avoid cigarette smoking.  I discussed with the patient that most people either abstain from alcohol or drink within safe limits (<=14/week and <=4 drinks/occasion for males, <=7/weeks and <= 3 drinks/occasion for females) and that the risk for alcohol disorders and other health effects rises proportionally with the number of drinks per week and how often a drinker exceeds daily limits.  Discussed cessation/primary prevention of drug use and availability of treatment for abuse.   Diet: Encouraged to adjust caloric intake to maintain  or achieve ideal body weight, to reduce intake of dietary saturated fat and total fat, to limit sodium intake by avoiding high sodium foods and not adding table salt, and to maintain adequate dietary potassium and calcium preferably from fresh fruits, vegetables, and low-fat dairy products.    Stressed the importance of regular exercise  Injury prevention: Discussed safety belts, safety helmets, smoke detector, smoking near bedding or upholstery.   Dental health: Discussed importance of regular tooth brushing, flossing, and dental visits.    NEXT PREVENTATIVE PHYSICAL DUE IN 1 YEAR. Return in about 6 months (around 01/15/2024) for HTN/HLD, MOOD, IFG.

## 2023-07-17 ENCOUNTER — Encounter: Payer: Self-pay | Admitting: Nurse Practitioner

## 2023-07-17 LAB — CBC WITH DIFFERENTIAL/PLATELET
Basophils Absolute: 0 10*3/uL (ref 0.0–0.2)
Basos: 1 %
EOS (ABSOLUTE): 0.1 10*3/uL (ref 0.0–0.4)
Eos: 2 %
Hematocrit: 40.1 % (ref 34.0–46.6)
Hemoglobin: 13.1 g/dL (ref 11.1–15.9)
Immature Grans (Abs): 0 10*3/uL (ref 0.0–0.1)
Immature Granulocytes: 0 %
Lymphocytes Absolute: 2.5 10*3/uL (ref 0.7–3.1)
Lymphs: 46 %
MCH: 28.3 pg (ref 26.6–33.0)
MCHC: 32.7 g/dL (ref 31.5–35.7)
MCV: 87 fL (ref 79–97)
Monocytes Absolute: 0.4 10*3/uL (ref 0.1–0.9)
Monocytes: 8 %
Neutrophils Absolute: 2.3 10*3/uL (ref 1.4–7.0)
Neutrophils: 43 %
Platelets: 235 10*3/uL (ref 150–450)
RBC: 4.63 x10E6/uL (ref 3.77–5.28)
RDW: 12.4 % (ref 11.7–15.4)
WBC: 5.3 10*3/uL (ref 3.4–10.8)

## 2023-07-17 LAB — COMPREHENSIVE METABOLIC PANEL WITH GFR
ALT: 20 IU/L (ref 0–32)
AST: 20 IU/L (ref 0–40)
Albumin: 4.5 g/dL (ref 3.8–4.9)
Alkaline Phosphatase: 138 IU/L — ABNORMAL HIGH (ref 44–121)
BUN/Creatinine Ratio: 12 (ref 9–23)
BUN: 9 mg/dL (ref 6–24)
Bilirubin Total: 0.7 mg/dL (ref 0.0–1.2)
CO2: 22 mmol/L (ref 20–29)
Calcium: 9.7 mg/dL (ref 8.7–10.2)
Chloride: 101 mmol/L (ref 96–106)
Creatinine, Ser: 0.76 mg/dL (ref 0.57–1.00)
Globulin, Total: 2.9 g/dL (ref 1.5–4.5)
Glucose: 80 mg/dL (ref 70–99)
Potassium: 4.2 mmol/L (ref 3.5–5.2)
Sodium: 139 mmol/L (ref 134–144)
Total Protein: 7.4 g/dL (ref 6.0–8.5)
eGFR: 91 mL/min/{1.73_m2} (ref >59)

## 2023-07-17 LAB — LIPID PANEL W/O CHOL/HDL RATIO
Cholesterol, Total: 242 mg/dL — ABNORMAL HIGH (ref 100–199)
HDL: 48 mg/dL (ref >39)
LDL Chol Calc (NIH): 176 mg/dL — ABNORMAL HIGH (ref 0–99)
Triglycerides: 101 mg/dL (ref 0–149)
VLDL Cholesterol Cal: 18 mg/dL (ref 5–40)

## 2023-07-17 LAB — TSH: TSH: 1.1 u[IU]/mL (ref 0.450–4.500)

## 2023-07-17 LAB — PROLACTIN: Prolactin: 17.3 ng/mL (ref 3.6–25.2)

## 2023-07-17 NOTE — Progress Notes (Signed)
 Contacted via MyChart The 10-year ASCVD risk score (Arnett DK, et al., 2019) is: 6.3%   Values used to calculate the score:     Age: 58 years     Sex: Female     Is Non-Hispanic African American: Yes     Diabetic: No     Tobacco smoker: No     Systolic Blood Pressure: 136 mmHg     Is BP treated: No     HDL Cholesterol: 48 mg/dL     Total Cholesterol: 242 mg/dL   Good afternoon Gabriella Hensley, your labs have returned: - CBC shows no anemia or infection. - Kidney function, creatinine and eGFR, remains normal, as is liver function, AST and ALT.  - Thyroid  level normal.  - Lipid panel continues to show elevations, we will recheck fasting in 6 months after you have been on Zepbound for awhile and if not improving we can discussed treatment options.  Any questions? Keep being amazing!!  Thank you for allowing me to participate in your care.  I appreciate you. Kindest regards, Laynie Espy

## 2024-01-16 NOTE — Patient Instructions (Signed)
Wegovy or Zepbound  Focus on DASH diet for high blood pressure or Mediterranean diet  Be Involved in Caring For Your Health:  Taking Medications When medications are taken as directed, they can greatly improve your health. But if they are not taken as prescribed, they may not work. In some cases, not taking them correctly can be harmful. To help ensure your treatment remains effective and safe, understand your medications and how to take them. Bring your medications to each visit for review by your provider.  Your lab results, notes, and after visit summary will be available on My Chart. We strongly encourage you to use this feature. If lab results are abnormal the clinic will contact you with the appropriate steps. If the clinic does not contact you assume the results are satisfactory. You can always view your results on My Chart. If you have questions regarding your health or results, please contact the clinic during office hours. You can also ask questions on My Chart.  We at [facility] are grateful that you chose us to provide your care. We strive to provide evidence-based and compassionate care and are always looking for feedback. If you get a survey from the clinic please complete this so we can hear your opinions.  Preventing High Cholesterol Cholesterol is a white, waxy substance similar to fat that the human body needs to help build cells. The liver makes all the cholesterol that a person's body needs. Having high cholesterol (hypercholesterolemia) increases your risk for heart disease and stroke. Extra or excess cholesterol comes from the food that you eat. High cholesterol can often be prevented with diet and lifestyle changes. If you already have high cholesterol, you can control it with diet, lifestyle changes, and medicines. How can high cholesterol affect me? If you have high cholesterol, fatty deposits (plaques) may build up on the walls of your blood vessels. The blood  vessels that carry blood away from your heart are called arteries. Plaques make the arteries narrower and stiffer. This in turn can: Restrict or block blood flow and cause blood clots to form. Increase your risk for heart attack and stroke. What can increase my risk for high cholesterol? This condition is more likely to develop in people who: Eat foods that are high in saturated fat or cholesterol. Saturated fat is mostly found in foods that come from animal sources. Are overweight. Are not getting enough exercise. Use products that contain nicotine or tobacco, such as cigarettes, e-cigarettes, and chewing tobacco. Have a family history of high cholesterol (familial hypercholesterolemia). What actions can I take to prevent this? Nutrition  Eat less saturated fat. Avoid trans fats (partially hydrogenated oils). These are often found in margarine and in some baked goods, fried foods, and snacks bought in packages. Avoid precooked or cured meat, such as bacon, sausages, or meat loaves. Avoid foods and drinks that have added sugars. Eat more fruits, vegetables, and whole grains. Choose healthy sources of protein, such as fish, poultry, lean cuts of red meat, beans, peas, lentils, and nuts. Choose healthy sources of fat, such as: Nuts. Vegetable oils, especially olive oil. Fish that have healthy fats, such as omega-3 fatty acids. These fish include mackerel or salmon. Lifestyle Lose weight if you are overweight. Maintaining a healthy body mass index (BMI) can help prevent or control high cholesterol. It can also lower your risk for diabetes and high blood pressure. Ask your health care provider to help you with a diet and exercise plan to lose   weight safely. Do not use any products that contain nicotine or tobacco. These products include cigarettes, chewing tobacco, and vaping devices, such as e-cigarettes. If you need help quitting, ask your health care provider. Alcohol use Do not drink  alcohol if: Your health care provider tells you not to drink. You are pregnant, may be pregnant, or are planning to become pregnant. If you drink alcohol: Limit how much you have to: 0-1 drink a day for women. 0-2 drinks a day for men. Know how much alcohol is in your drink. In the [place] one drink equals one 12 oz bottle of beer (355 mL), one 5 oz glass of wine (148 mL), or one 1 oz glass of hard liquor (44 mL). Activity  Get enough exercise. Do exercises as told by your health care provider. Each week, do at least 150 minutes of exercise that takes a medium level of effort (moderate-intensity exercise). This kind of exercise: Makes your heart beat faster while allowing you to still be able to talk. Can be done in short sessions several times a day or longer sessions a few times a week. For example, on 5 days each week, you could walk fast or ride your bike 3 times a day for 10 minutes each time. Medicines Your health care provider may recommend medicines to help lower cholesterol. This may be a medicine to lower the amount of cholesterol that your liver makes. You may need medicine if: Diet and lifestyle changes have not lowered your cholesterol enough. You have high cholesterol and other risk factors for heart disease or stroke. Take over-the-counter and prescription medicines only as told by your health care provider. General information Manage your risk factors for high cholesterol. Talk with your health care provider about all your risk factors and how to lower your risk. Manage other conditions that you have, such as diabetes or high blood pressure (hypertension). Have blood tests to check your cholesterol levels at regular points in time as told by your health care provider. Keep all follow-up visits. This is important. Where to find more information American Heart Association: www.heart.org National Heart, Lung, and Blood Institute: www.nhlbi.nih.gov Summary High cholesterol  increases your risk for heart disease and stroke. By keeping your cholesterol level low, you can reduce your risk for these conditions. High cholesterol can often be prevented with diet and lifestyle changes. Work with your health care provider to manage your risk factors, and have your blood tested regularly. This information is not intended to replace advice given to you by your health care provider. Make sure you discuss any questions you have with your health care provider. Document Revised: 10/13/2021 Document Reviewed: 05/16/2020 Elsevier Patient Education  2024 [employer]  

## 2024-01-17 ENCOUNTER — Encounter: Payer: Self-pay | Admitting: Nurse Practitioner

## 2024-01-17 ENCOUNTER — Ambulatory Visit: Admitting: Nurse Practitioner

## 2024-01-17 ENCOUNTER — Ambulatory Visit
Admission: RE | Admit: 2024-01-17 | Discharge: 2024-01-17 | Disposition: A | Discharge status: Home / Self Care | Source: Ambulatory Visit | Attending: Nurse Practitioner | Admitting: Nurse Practitioner

## 2024-01-17 VITALS — BP 138/72 | HR 82 | Temp 97.9°F | Resp 15 | Ht 67.99 in | Wt 233.4 lb

## 2024-01-17 DIAGNOSIS — R7303 Prediabetes: Secondary | ICD-10-CM

## 2024-01-17 DIAGNOSIS — G2401 Drug induced subacute dyskinesia: Secondary | ICD-10-CM

## 2024-01-17 DIAGNOSIS — E669 Obesity, unspecified: Secondary | ICD-10-CM

## 2024-01-17 DIAGNOSIS — F3181 Bipolar II disorder: Secondary | ICD-10-CM

## 2024-01-17 DIAGNOSIS — Z1231 Encounter for screening mammogram for malignant neoplasm of breast: Secondary | ICD-10-CM

## 2024-01-17 DIAGNOSIS — Z23 Encounter for immunization: Secondary | ICD-10-CM | POA: Diagnosis not present

## 2024-01-17 DIAGNOSIS — E782 Mixed hyperlipidemia: Secondary | ICD-10-CM

## 2024-01-17 NOTE — Progress Notes (Signed)
 BP 138/72 (BP Location: Left Arm, Patient Position: Sitting, Cuff Size: Large)   Pulse 82   Temp 97.9 F (36.6 C) (Oral)   Resp 15   Ht 5' 7.99 (1.727 m)   Wt 233 lb 6.4 oz (105.9 kg)   LMP  (LMP Unknown)   SpO2 98%   BMI 35.50 kg/m    Subjective:    Patient ID: Gabriella Hensley, female    DOB: [birth year], 58 y.o.   MRN: [id number]  HPI: Gabriella Hensley is a 58 y.o. female  Chief Complaint  Patient presents with   HTN/HLD    Once monthly home checks. No issues.    Mood    Doing better and feels in a better space.    Impaired fasting glucose   Brain fog    Some things come easy others things don't. Multi vitamins help her joint/achy pain but then causes constipation. Wonders correlation to menopause.     HYPERLIPIDEMIA Hyperlipidemia status: good compliance Past cholesterol meds: none Supplements: none Aspirin:  no The 10-year ASCVD risk score (Arnett DK, et al., 2019) is: 6.6%   Values used to calculate the score:     Age: 39 years     Clincally relevant sex: Female     Is Non-Hispanic African American: Yes     Diabetic: No     Tobacco smoker: No     Systolic Blood Pressure: 138 mmHg     Is BP treated: No     HDL Cholesterol: 48 mg/dL     Total Cholesterol: 242 mg/dL Chest pain:  no Coronary artery disease:  no Family history CAD:  no Family history early CAD:  no   Impaired Fasting Glucose Zepbound via [employer] at workplace.  Has been on since April or May.  Has lost 16 pounds since starting.   HbA1C:  Lab Results  Component Value Date   HGBA1C 6.0 (H) 07/16/2023  Duration of elevated blood sugar: chronic Polydipsia: no Polyuria: no Weight change: no Visual disturbance: no Glucose Monitoring: no    Accucheck frequency: Not Checking    Fasting glucose:     Post prandial:  Diabetic Education: Not Completed Family history of diabetes: no   BIPOLAR DISORDER Has been on Risperidone  and Wellbutrin  for years. Has been having brain fog issues for  awhile. Has history of hysterectomy, but has one ovary still in place.   Mood status: stable Satisfied with current treatment?: yes Symptom severity: moderate  Duration of current treatment : chronic Side effects: no Medication compliance: good compliance Psychotherapy/counseling: yes in the past Depressed mood: no Anxious mood: no Anhedonia: no Significant weight loss or gain: no Insomnia: none Fatigue: no Feelings of worthlessness or guilt: no Impaired concentration/indecisiveness: no Suicidal ideations: no Hopelessness: no Crying spells: no    01/17/2024    2:41 PM 07/16/2023    3:57 PM 02/06/2023    3:09 PM 08/14/2021   11:07 AM 06/19/2021    8:25 AM  Depression screen PHQ 2/9  Decreased Interest 0 0 0 0 0  Down, Depressed, Hopeless 0 0 0 0 0  PHQ - 2 Score 0 0 0 0 0  Altered sleeping 0 0 0 0 0  Tired, decreased energy 0 0 0 0 0  Change in appetite 0 0 0 0 0  Feeling bad or failure about yourself  0 0 0 0 0  Trouble concentrating 0 0 0 0 0  Moving slowly or fidgety/restless 0 0 0 0 0  Suicidal thoughts 0 0 0 0 0  PHQ-9 Score 0 0 0 0 0  Difficult doing work/chores Not difficult at all Not difficult at all Not difficult at all Not difficult at all Not difficult at all       01/17/2024    2:42 PM 07/16/2023    3:58 PM 02/06/2023    3:10 PM 08/14/2021   11:07 AM  GAD 7 : Generalized Anxiety Score  Nervous, Anxious, on Edge 0 0 0 0  Control/stop worrying 0 0 0 0  Worry too much - different things 0 0 1 0  Trouble relaxing 0 0 0 0  Restless 0 0 0 0  Easily annoyed or irritable 0 0 1 0  Afraid - awful might happen 0 0 0 0  Total GAD 7 Score 0 0 2 0  Anxiety Difficulty  Not difficult at all Not difficult at all Not difficult at all   Relevant past medical, surgical, family and social history reviewed and updated as indicated. Interim medical history since our last visit reviewed. Allergies and medications reviewed and updated.  Review of Systems  Constitutional:   Negative for activity change, appetite change, diaphoresis, fatigue and fever.  Respiratory:  Negative for cough, chest tightness and shortness of breath.   Cardiovascular:  Negative for chest pain, palpitations and leg swelling.  Gastrointestinal: Negative.   Endocrine: Negative for cold intolerance, heat intolerance, polydipsia, polyphagia and polyuria.  Neurological: Negative.   Psychiatric/Behavioral: Negative.      Per HPI unless specifically indicated above     Objective:    BP 138/72 (BP Location: Left Arm, Patient Position: Sitting, Cuff Size: Large)   Pulse 82   Temp 97.9 F (36.6 C) (Oral)   Resp 15   Ht 5' 7.99 (1.727 m)   Wt 233 lb 6.4 oz (105.9 kg)   LMP  (LMP Unknown)   SpO2 98%   BMI 35.50 kg/m   Wt Readings from Last 3 Encounters:  01/17/24 233 lb 6.4 oz (105.9 kg)  07/16/23 249 lb 3.2 oz (113 kg)  04/05/23 246 lb 9.6 oz (111.9 kg)    Physical Exam Vitals and nursing note reviewed.  Constitutional:      General: She is awake. She is not in acute distress.    Appearance: She is well-developed and well-groomed. She is obese. She is not ill-appearing or toxic-appearing.  HENT:     Head: Normocephalic.     Right Ear: Hearing and external ear normal.     Left Ear: Hearing and external ear normal.  Eyes:     General: Lids are normal.        Right eye: No discharge.        Left eye: No discharge.     Conjunctiva/sclera: Conjunctivae normal.     Pupils: Pupils are equal, round, and reactive to light.  Neck:     Thyroid : No thyromegaly.     Vascular: No carotid bruit.  Cardiovascular:     Rate and Rhythm: Normal rate and regular rhythm.     Heart sounds: Normal heart sounds. No murmur heard.    No gallop.  Pulmonary:     Effort: Pulmonary effort is normal. No accessory muscle usage or respiratory distress.     Breath sounds: Normal breath sounds.  Abdominal:     General: Bowel sounds are normal. There is no distension.     Palpations: Abdomen is soft.      Tenderness: There is no abdominal tenderness.  Musculoskeletal:     Cervical back: Normal range of motion and neck supple.     Right lower leg: No edema.     Left lower leg: No edema.  Lymphadenopathy:     Cervical: No cervical adenopathy.  Skin:    General: Skin is warm and dry.  Neurological:     Mental Status: She is alert and oriented to person, place, and time.     Deep Tendon Reflexes: Reflexes are normal and symmetric.     Reflex Scores:      Brachioradialis reflexes are 2+ on the right side and 2+ on the left side.      Patellar reflexes are 2+ on the right side and 2+ on the left side. Psychiatric:        Attention and Perception: Attention normal.        Mood and Affect: Mood normal.        Speech: Speech normal.        Behavior: Behavior normal. Behavior is cooperative.        Thought Content: Thought content normal.     Results for orders placed or performed in visit on 07/16/23  Bayer DCA Hb A1c Waived   Collection Time: 07/16/23  3:59 PM  Result Value Ref Range   HB A1C (BAYER DCA - WAIVED) 6.0 (H) 4.8 - 5.6 %  Microalbumin, Urine Waived   Collection Time: 07/16/23  3:59 PM  Result Value Ref Range   Microalb, Ur Waived 30 (H) 0 - 19 mg/L   Creatinine, Urine Waived 200 10 - 300 mg/dL   Microalb/Creat Ratio <30 <30 mg/g  Prolactin   Collection Time: 07/16/23  4:00 PM  Result Value Ref Range   Prolactin 17.3 3.6 - 25.2 ng/mL  CBC with Differential/Platelet   Collection Time: 07/16/23  4:00 PM  Result Value Ref Range   WBC 5.3 3.4 - 10.8 x10E3/uL   RBC 4.63 3.77 - 5.28 x10E6/uL   Hemoglobin 13.1 11.1 - 15.9 g/dL   Hematocrit 59.8 65.9 - 46.6 %   MCV 87 79 - 97 fL   MCH 28.3 26.6 - 33.0 pg   MCHC 32.7 31.5 - 35.7 g/dL   RDW 87.5 88.2 - 84.5 %   Platelets 235 150 - 450 x10E3/uL   Neutrophils 43 Not Estab. %   Lymphs 46 Not Estab. %   Monocytes 8 Not Estab. %   Eos 2 Not Estab. %   Basos 1 Not Estab. %   Neutrophils Absolute 2.3 1.4 - 7.0 x10E3/uL    Lymphocytes Absolute 2.5 0.7 - 3.1 x10E3/uL   Monocytes Absolute 0.4 0.1 - 0.9 x10E3/uL   EOS (ABSOLUTE) 0.1 0.0 - 0.4 x10E3/uL   Basophils Absolute 0.0 0.0 - 0.2 x10E3/uL   Immature Granulocytes 0 Not Estab. %   Immature Grans (Abs) 0.0 0.0 - 0.1 x10E3/uL  Comprehensive metabolic panel with GFR   Collection Time: 07/16/23  4:00 PM  Result Value Ref Range   Glucose 80 70 - 99 mg/dL   BUN 9 6 - 24 mg/dL   Creatinine, Ser 9.23 0.57 - 1.00 mg/dL   eGFR 91 >40 fO/fpw/8.26   BUN/Creatinine Ratio 12 9 - 23   Sodium 139 134 - 144 mmol/L   Potassium 4.2 3.5 - 5.2 mmol/L   Chloride 101 96 - 106 mmol/L   CO2 22 20 - 29 mmol/L   Calcium 9.7 8.7 - 10.2 mg/dL   Total Protein 7.4 6.0 - 8.5 g/dL   Albumin  4.5 3.8 - 4.9 g/dL   Globulin, Total 2.9 1.5 - 4.5 g/dL   Bilirubin Total 0.7 0.0 - 1.2 mg/dL   Alkaline Phosphatase 138 (H) 44 - 121 IU/L   AST 20 0 - 40 IU/L   ALT 20 0 - 32 IU/L  TSH   Collection Time: 07/16/23  4:00 PM  Result Value Ref Range   TSH 1.100 0.450 - 4.500 uIU/mL  Lipid Panel w/o Chol/HDL Ratio   Collection Time: 07/16/23  4:00 PM  Result Value Ref Range   Cholesterol, Total 242 (H) 100 - 199 mg/dL   Triglycerides 898 0 - 149 mg/dL   HDL 48 >60 mg/dL   VLDL Cholesterol Cal 18 5 - 40 mg/dL   LDL Chol Calc (NIH) 823 (H) 0 - 99 mg/dL      Assessment & Plan:   Problem List Items Addressed This Visit       Nervous and Auditory   Tardive dyskinesia   AIMS = 1 today with mild symptoms last visit, discussed at length options -- she refuses to change medication regimen as has worked well for her for >15 years and kept Bipolar stable.  Discussed options, with first being discontinuation of Risperdal  if ongoing or worsening.  Could also consider change to Clozapine or addition of Xenazine. Labs today.  Obtain Prolactin level at next visit.        Other   Prediabetes   Ongoing, continues to focus on diet changes.  A1c 6% last visit.  Continued focus on diabetic diet.   Initiate medication as needed.  Recheck A1c today.      Relevant Orders   HgB A1c   Basic metabolic panel with GFR   Obesity (BMI 35.0-39.9 without comorbidity)   BMI 35.50, has lost 16 pounds with Zepbound on board.  Recommended eating smaller high protein, low fat meals more frequently and exercising 30 mins a day 5 times a week with a goal of 10-15lb weight loss in the next 3 months. Patient voiced their understanding and motivation to adhere to these recommendations.       Relevant Medications   ZEPBOUND 12.5 MG/0.5ML Pen   Hyperlipidemia   Chronic, ongoing.  No current medications as ASCVD 6.6%.  Continue diet focus.  Lipid panel and CMP next visit.      Bipolar 2 disorder (HCC) - Primary   Chronic, stable.  Denies SI/HI.  Has been stable on current regimen for years.  Continue current medication regimen and adjust as needed.  Refills sent.  AIMS = 1 last visit, some mild tardive dyskinesia symptoms present, at this time she wants no medication adjustments and we will continue to monitor closely.  Check labs at physical.  Recommend starting B12 1000 MCG daily to help with her menopausal symptoms, including brain fog.      Other Visit Diagnoses       Flu vaccine need       Flu vaccine today, educated patient.   Relevant Orders   Flu vaccine trivalent PF, 6mos and older(Flulaval,Afluria,Fluarix,Fluzone)        Follow up plan: Return in about 6 months (around 07/17/2024) for Annual Physical after 07/15/24.

## 2024-01-17 NOTE — Assessment & Plan Note (Signed)
 AIMS = 1 today with mild symptoms last visit, discussed at length options -- she refuses to change medication regimen as has worked well for her for >15 years and kept Bipolar stable.  Discussed options, with first being discontinuation of Risperdal  if ongoing or worsening.  Could also consider change to Clozapine or addition of Xenazine. Labs today.  Obtain Prolactin level at next visit.

## 2024-01-17 NOTE — Assessment & Plan Note (Signed)
 Chronic, stable.  Denies SI/HI.  Has been stable on current regimen for years.  Continue current medication regimen and adjust as needed.  Refills sent.  AIMS = 1 last visit, some mild tardive dyskinesia symptoms present, at this time she wants no medication adjustments and we will continue to monitor closely.  Check labs at physical.  Recommend starting B12 1000 MCG daily to help with her menopausal symptoms, including brain fog.

## 2024-01-17 NOTE — Assessment & Plan Note (Signed)
 Chronic, ongoing.  No current medications as ASCVD 6.6%.  Continue diet focus.  Lipid panel and CMP next visit.

## 2024-01-17 NOTE — Assessment & Plan Note (Signed)
 Ongoing, continues to focus on diet changes.  A1c 6% last visit.  Continued focus on diabetic diet.  Initiate medication as needed.  Recheck A1c today.

## 2024-01-17 NOTE — Assessment & Plan Note (Signed)
 BMI 35.50, has lost 16 pounds with Zepbound on board.  Recommended eating smaller high protein, low fat meals more frequently and exercising 30 mins a day 5 times a week with a goal of 10-15lb weight loss in the next 3 months. Patient voiced their understanding and motivation to adhere to these recommendations.

## 2024-01-18 ENCOUNTER — Ambulatory Visit: Payer: Self-pay | Admitting: Nurse Practitioner

## 2024-01-18 LAB — BASIC METABOLIC PANEL WITH GFR
BUN/Creatinine Ratio: 16 (ref 9–23)
BUN: 13 mg/dL (ref 6–24)
CO2: 23 mmol/L (ref 20–29)
Calcium: 9.7 mg/dL (ref 8.7–10.2)
Chloride: 102 mmol/L (ref 96–106)
Creatinine, Ser: 0.79 mg/dL (ref 0.57–1.00)
Glucose: 78 mg/dL (ref 70–99)
Potassium: 4.1 mmol/L (ref 3.5–5.2)
Sodium: 139 mmol/L (ref 134–144)
eGFR: 87 mL/min/1.73 (ref >59)

## 2024-01-18 LAB — HEMOGLOBIN A1C
Est. average glucose Bld gHb Est-mCnc: 111 mg/dL
Hgb A1c MFr Bld: 5.5 % (ref 4.8–5.6)

## 2024-01-18 NOTE — Progress Notes (Signed)
 Contacted via MyChart  Good morning Gabriella Hensley, your labs have returned and overall are improved. A1c shows no prediabetes or diabetes. Improved with weight loss. Remainder of labs stable. Any questions? Keep being stellar!!  Thank you for allowing me to participate in your care.  I appreciate you. Kindest regards, Nihar Klus

## 2024-01-27 ENCOUNTER — Ambulatory Visit: Admitting: Nurse Practitioner

## 2024-02-21 ENCOUNTER — Encounter: Payer: Self-pay | Admitting: Nurse Practitioner

## 2024-07-17 ENCOUNTER — Encounter: Admitting: Nurse Practitioner
